# Patient Record
Sex: Female | Born: 1990 | Race: White | Marital: Single | Smoking: Never smoker
Health system: Southern US, Community
[De-identification: ages and names within clinical notes are randomized; demographics above are authoritative.]

## PROBLEM LIST (undated history)

## (undated) DIAGNOSIS — F32A Depression, unspecified: Secondary | ICD-10-CM

## (undated) DIAGNOSIS — J45909 Unspecified asthma, uncomplicated: Secondary | ICD-10-CM

## (undated) DIAGNOSIS — K589 Irritable bowel syndrome without diarrhea: Secondary | ICD-10-CM

## (undated) DIAGNOSIS — F419 Anxiety disorder, unspecified: Secondary | ICD-10-CM

## (undated) DIAGNOSIS — K219 Gastro-esophageal reflux disease without esophagitis: Secondary | ICD-10-CM

## (undated) DIAGNOSIS — G43909 Migraine, unspecified, not intractable, without status migrainosus: Secondary | ICD-10-CM

## (undated) DIAGNOSIS — J302 Other seasonal allergic rhinitis: Secondary | ICD-10-CM

## (undated) DIAGNOSIS — N83209 Unspecified ovarian cyst, unspecified side: Secondary | ICD-10-CM

## (undated) DIAGNOSIS — F329 Major depressive disorder, single episode, unspecified: Secondary | ICD-10-CM

## (undated) DIAGNOSIS — N946 Dysmenorrhea, unspecified: Secondary | ICD-10-CM

## (undated) DIAGNOSIS — J069 Acute upper respiratory infection, unspecified: Secondary | ICD-10-CM

## (undated) DIAGNOSIS — N809 Endometriosis, unspecified: Secondary | ICD-10-CM

## (undated) DIAGNOSIS — R51 Headache: Secondary | ICD-10-CM

## (undated) DIAGNOSIS — R42 Dizziness and giddiness: Secondary | ICD-10-CM

## (undated) DIAGNOSIS — H539 Unspecified visual disturbance: Secondary | ICD-10-CM

## (undated) DIAGNOSIS — R519 Headache, unspecified: Secondary | ICD-10-CM

## (undated) HISTORY — DX: Anxiety disorder, unspecified: F41.9

## (undated) HISTORY — DX: Migraine, unspecified, not intractable, without status migrainosus: G43.909

## (undated) HISTORY — DX: Depression, unspecified: F32.A

## (undated) HISTORY — DX: Unspecified asthma, uncomplicated: J45.909

## (undated) HISTORY — DX: Other seasonal allergic rhinitis: J30.2

## (undated) HISTORY — DX: Gastro-esophageal reflux disease without esophagitis: K21.9

## (undated) HISTORY — DX: Irritable bowel syndrome without diarrhea: K58.9

## (undated) HISTORY — PX: OTHER SURGICAL HISTORY: SHX169

## (undated) HISTORY — DX: Headache, unspecified: R51.9

## (undated) HISTORY — PX: LAPAROSCOPY ABDOMEN DIAGNOSTIC: PRO50

## (undated) HISTORY — DX: Dysmenorrhea, unspecified: N94.6

## (undated) HISTORY — DX: Acute upper respiratory infection, unspecified: J06.9

## (undated) HISTORY — PX: TONSILLECTOMY: SUR1361

## (undated) HISTORY — DX: Unspecified visual disturbance: H53.9

## (undated) HISTORY — DX: Headache: R51

## (undated) HISTORY — DX: Dizziness and giddiness: R42

---
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
## 2012-06-01 ENCOUNTER — Encounter (INDEPENDENT_AMBULATORY_CARE_PROVIDER_SITE_OTHER): Payer: Self-pay | Admitting: Family Medicine

## 2012-06-01 ENCOUNTER — Ambulatory Visit (INDEPENDENT_AMBULATORY_CARE_PROVIDER_SITE_OTHER): Payer: No Typology Code available for payment source | Admitting: Family Medicine

## 2012-06-01 VITALS — BP 123/85 | HR 65 | Temp 98.0°F | Resp 18 | Ht 62.0 in | Wt 125.0 lb

## 2012-06-01 MED ORDER — FAMOTIDINE 20 MG PO TABS
20.00 mg | ORAL_TABLET | Freq: Two times a day (BID) | ORAL | Status: AC
Start: 2012-06-01 — End: 2013-06-01

## 2012-06-01 MED ORDER — LANSOPRAZOLE 30 MG PO CPDR
30.00 mg | DELAYED_RELEASE_CAPSULE | Freq: Every day | ORAL | Status: AC
Start: 2012-06-01 — End: 2013-06-01

## 2012-06-01 MED ORDER — ONDANSETRON HCL 4 MG PO TABS
4.00 mg | ORAL_TABLET | Freq: Every day | ORAL | Status: AC | PRN
Start: 2012-06-01 — End: 2013-06-01

## 2012-06-01 MED ORDER — OMEPRAZOLE 40 MG PO CPDR
40.00 mg | DELAYED_RELEASE_CAPSULE | Freq: Every day | ORAL | Status: DC
Start: 2012-06-01 — End: 2012-06-01

## 2012-06-01 MED ORDER — ONDANSETRON 4 MG PO TBDP
4.00 mg | ORAL_TABLET | Freq: Once | ORAL | Status: AC
Start: 2012-06-01 — End: 2012-06-01
  Administered 2012-06-01: 4 mg via ORAL

## 2012-06-01 NOTE — Progress Notes (Signed)
Subjective:       Patient ID: Krystal Mills is a 21 y.o. female.    Chief Complaint   Patient presents with   . Heartburn     C/O extreme heart burn and breathing is uncomfortable           HPI Comments: Pt has history of acid reflux and also h/o irritable bowel with bowel spasms. Has severe allergies, not worse lately. Had been on prilosec, d/c'd due to causing headache and concerns over long term use. Has never seen GI or had upper GI or EGD. No h/o ulcer disease. Tested neg for h.pylori in past.  Has used bentyl in past for bowel spasms which helped, but not having those sx now.    Symptoms started this morning and accelerated while at work, epigastric discomfort to LUQ, chest discomfort and SOB, no cough. Mild nausea, no emesis.  Has not tried anything. Liquid antacids make her nauseated.         Heartburn  She complains of abdominal pain, heartburn and nausea. She reports no chest pain, no choking, no coughing or no wheezing.       The following portions of the patient's history were reviewed and updated as appropriate: allergies, current medications, past family history, past medical history, past social history, past surgical history and problem list.    Review of Systems   Respiratory: Positive for chest tightness and shortness of breath. Negative for cough, choking and wheezing.    Cardiovascular: Negative for chest pain.   Gastrointestinal: Positive for heartburn, nausea and abdominal pain. Negative for vomiting, diarrhea, constipation and blood in stool.           Objective:    Physical Exam   Constitutional: She appears well-developed and well-nourished.        Looks uncomfortable   HENT:   Head: Normocephalic and atraumatic.   Mouth/Throat: No oropharyngeal exudate.   Eyes: Conjunctivae normal are normal. No scleral icterus.   Neck: No JVD present. No thyromegaly present.   Cardiovascular: Normal rate, regular rhythm and normal heart sounds.    Pulmonary/Chest: Effort normal and breath sounds normal. No  stridor. She has no wheezes.   Abdominal: Soft. Normal appearance and bowel sounds are normal. There is no hepatosplenomegaly. There is tenderness in the epigastric area and left upper quadrant. There is no rigidity and no guarding.       Lymphadenopathy:     She has no cervical adenopathy.   Neurological: She is alert.             EKG: normal sinus rhythm, no ischemic changes    Assessment:       Severe reflux, probable esophageal spasm.  Pt given zofran with some relief of nausea. Was not given liquid antacid due to previous nausea with med.       Plan:       Restart PPI, try prevacid which hopefully will not cause headache as prilosec did. Start pepcid bid for a few days for faster relief. Follow up with GI.  Follow up if not improving or if any new symptoms appear.

## 2012-06-01 NOTE — Patient Instructions (Addendum)
Start on prilosec 40 mg once a day  -- best way to take is 30 minutes before a meal.  Stay on this until you are seen by a gastroenterologist.    Also start on pepcid 20 mg twice a day- take for 3-4 days or until reflux symptoms are completely gone.          GERD (Adult)    The esophagus is a tube that carries food from the mouth to the stomach. A valve at the lower end of the esophagus prevents stomach acid from flowing upward. If this valve does not work properly, acid from the stomach enters the esophagus. If this occurs over and over, the acid will injure the lining of the esophagus.  This condition is called GERD (gastroesophageal reflux disease) or acid reflux. When stomach acid flows upward into the esophagus, it causes burning, pressure or sharp pain in the upper abdomen or mid to lower chest. The pain can spread to the neck, back, or shoulder, similar to heart pain (angina). There may be belching, an acid taste in the back of the throat, chronic cough, or sore throat or hoarseness. GERD symptoms often occur during the day after a big meal, but it can also occur at night when lying down. Smoking,as well as drinking alcohol, increases the risk of GERD.  GERD is a chronic condition. Once it begins, it is often lifelong. Treatment includes changes in eating habits and the use of acid blocker medications to decrease the amount of acid in the stomach.  Symptoms often improve with treatment, but if treatment is stopped, the symptoms usually return after a few months. So most persons with GERD will need to continue treatment.  Home Care:   Take the prescribed acid blocker medication for the full course of treatment even if you begin to feel better sooner. This medication can take up to several days to fully control your symptoms. If you can't afford the prescribed medication, you can try over-the-counter acid blockers, such as Pepcid AC, Tagamet, Zantac, or Aciphex. If these do not relieve your symptoms, a  stronger acid-blocker can be tried, such as Prilosec OTC.   You can use antacids, such as Tums, Rolaids, Mylanta, or Maalox, for pain. This will be useful the first few days after starting acid blockers when the blockers haven't started working yet. Follow the directions on the label. Liquid antacids may work better than tablets. Note that antacids can interfere with absorption of certain medications. Specifically, do not take Tagamet (cimetidine), Zantac (ranitidine), or Carafate (sucralfate) within 1 hour of taking an antacid. Talk with your pharmacist if you have any questions.   Limit or avoid fatty, fried, and spicy foods, as well as coffee, chocolate, mint, and foods with high acid content such as tomatoes and citrus fruit and juices (orange, grapefruit, lemon).   Avoid alcohol and smoking.   Don't eat large meals, especially at night. Frequent, smaller meals are best. Do not lie down right after eating. And don't eat anything 3 hours before going to bed.   If you are overweight, losing weight will reduce symptoms. Women should not wear corsets or girdles because this increases pressure on the stomach and worsens reflux.   If your symptoms occur during sleep, use a foam wedge to elevate your upper body (not just your head.) Or, place 4" blocks under the head of your bed.  Follow Up  with your doctor or as advised by our staff. Further testing may be needed.  If you do not begin to improve over the next 4 days, contact your doctor. If you had an x-ray, CT scan, or ECG (electrocardiogram), it will be reviewed by a specialist. You'll be notified of any new findings that affect your care.  Get Prompt Medical Attention  if any of the following occur:   Stomach pain gets worse or moves to the lower right abdomen (appendix area)   Chest pain appears or gets worse, or spreads to the back, neck, shoulder, or arm   Frequent vomiting (can't keep down liquids)   Blood in the stool or vomit (red or black in  color)   Feeling weak or dizzy, fainting, or trouble breathing   Fever of 100.46F (38C) or higher, or as directed by your healthcare provider   [address], [place], [place] [address]. All rights reserved. This information is not intended as a substitute for professional medical care. Always follow your healthcare professional's instructions.

## 2012-10-26 ENCOUNTER — Encounter (INDEPENDENT_AMBULATORY_CARE_PROVIDER_SITE_OTHER): Payer: Self-pay

## 2012-10-26 ENCOUNTER — Ambulatory Visit (INDEPENDENT_AMBULATORY_CARE_PROVIDER_SITE_OTHER): Payer: No Typology Code available for payment source | Admitting: Family Medicine

## 2012-10-26 VITALS — BP 105/72 | HR 69 | Temp 99.3°F | Resp 18 | Ht 62.0 in | Wt 135.0 lb

## 2012-10-26 MED ORDER — AZITHROMYCIN 250 MG PO TABS
ORAL_TABLET | ORAL | Status: AC
Start: 2012-10-26 — End: 2012-10-31

## 2012-10-26 MED ORDER — BECLOMETHASONE DIPROPIONATE 40 MCG/ACT IN AERS
2.00 | INHALATION_SPRAY | Freq: Two times a day (BID) | RESPIRATORY_TRACT | Status: DC
Start: 2012-10-26 — End: 2013-09-28

## 2012-10-26 NOTE — Patient Instructions (Addendum)
REST,  FLUIDS,  DIET    STEAM /  VAPORIZOR    QVAR=  MAINTENANCE:   START WI TH  2  PUFFS TWICE DAILY AND TAPER DOWN / OFF

## 2012-10-26 NOTE — Progress Notes (Signed)
Subjective:       Patient ID: Krystal Mills is a 22 y.o. female.    HPI Comments:   Pt c/o uri x 2 days; c/o increased cough; using albuterol more since yesterday        URI   Associated symptoms include congestion, coughing, ear pain, headaches and a sore throat.         Review of Systems   HENT: Positive for ear pain, congestion, sore throat, voice change and sinus pressure.    Respiratory: Positive for cough.           Colorful productive cough; kept awake   Neurological: Positive for headaches.           Objective:     Physical Exam   Nursing note and vitals reviewed.  Constitutional: She is oriented to person, place, and time. She appears well-developed and well-nourished. No distress.   HENT:   Mouth/Throat: Oropharynx is clear and moist. No oropharyngeal exudate.        TM Bilat clear    Hoarse voice     Eyes: Conjunctivae normal and EOM are normal. Pupils are equal, round, and reactive to light.   Neck: Normal range of motion. Neck supple.   Cardiovascular: Regular rhythm and normal heart sounds.  Exam reveals no gallop.    No murmur heard.  Pulmonary/Chest: Effort normal and breath sounds normal. No respiratory distress. She has no wheezes. She has no rales. She exhibits no tenderness.          E : I Increased  No wheeze     Lymphadenopathy:     She has no cervical adenopathy.   Neurological: She is alert and oriented to person, place, and time. No cranial nerve deficit.   Skin: Skin is warm. She is not diaphoretic.           Assessment:       Acute bronchitis  Asthma related to GERD and allergic rhinitis      Plan:       zpack  qvar and taper  Rest , fluids, diet    F/u prn

## 2013-02-08 ENCOUNTER — Encounter (INDEPENDENT_AMBULATORY_CARE_PROVIDER_SITE_OTHER): Payer: Self-pay

## 2013-02-08 ENCOUNTER — Ambulatory Visit (INDEPENDENT_AMBULATORY_CARE_PROVIDER_SITE_OTHER): Payer: No Typology Code available for payment source | Admitting: Family Medicine

## 2013-02-08 VITALS — BP 124/81 | HR 67 | Temp 98.9°F | Resp 18 | Ht 62.0 in | Wt 135.0 lb

## 2013-02-08 MED ORDER — DOXYCYCLINE HYCLATE 100 MG PO TABS
100.0000 mg | ORAL_TABLET | Freq: Two times a day (BID) | ORAL | Status: DC
Start: 2013-02-08 — End: 2013-09-28

## 2013-02-08 NOTE — Patient Instructions (Signed)
Lyme Disease  Lyme disease is caused by bacteria passed to you during the bite of a deer tick. Because the tick is so small, most people with Lyme disease do not remember being bitten. Since tests for Lyme disease are not always accurate early in the disease, the diagnosis can be hard to make. If the disease is suspected and the tests are negative, repeat testing may be required.    If untreated, Lyme disease may affect many parts of the body over months to years. Not everyone will have all the symptoms.   The first symptoms may appear within a few days to a month after the tick bite. These symptoms include a round red rash that grows up to 12 inches across and looks like a bull's-eye target with darker outer ring and a darker center. There may fever, chills, fatigue, body aches and headache. This first stage may be skipped in 20-30% of infected persons. It goes away on its own, even without treatment.   If the first symptoms were not treated, new symptoms may appear weeks to months after the bite. These symptoms include episodes of joint pain and swelling (especially the knees). Chronic arthritis develops in about 10% of these people.   Later, there may be weakness in an arm, leg or one side of the face, meningitis (headache, fever and neck pain), numbness and tingling in the arms or legs, confusion, and memory loss.  When the symptoms of Lyme disease are treated early enough, they can be stopped. Sometimes a second or third course of antibiotics may be needed if symptoms persist.  Home Care:  1) If oral antibiotics have been prescribed, it is very important that you take them exactly as directed until they are completely gone.  2) You may use acetaminophen (Tylenol) or ibuprofen (Motrin, Advil) to control pain, unless another pain medicine was prescribed. [ NOTE : If you have chronic liver or kidney disease or ever had a stomach ulcer or GI bleeding, talk with your doctor before using these medicines.]  (Aspirin should never be used in anyone under 98 years of age who is ill with a fever. It may cause severe liver damage.)  Follow Up  with your doctor as directed.  Get Prompt Medical Attention  if any of the following occur:  -- Current symptoms get worse  -- Unexplained fever, neck pain or stiffness, or headache  -- Arm, leg or facial weakness  -- Irregular or rapid heart beat  -- Joint pain or swelling  -- Numbness and tingling in the arms or legs, confusion or memory loss   [address] [place], [place] [address]. All rights reserved. This information is not intended as a substitute for professional medical care. Always follow your healthcare professional's instructions.        FOLLOW UP  WITH PCP       FOLLOW UP LYME TITER IN  3 -6  WEEKS    PROBIOTICS    [employer] ,  PRODUCE

## 2013-02-08 NOTE — Progress Notes (Signed)
Subjective:       Patient ID: Krystal Mills is a 22 y.o. female.    HPI Comments:   Pt c/o rash / bug bite this morning.    Denies pain admits pain.          Review of Systems   Constitutional: Positive for fatigue.   HENT: Positive for neck pain.    Musculoskeletal: Positive for back pain.          Jaw pain    LBP and shoulder pain     Skin: Positive for rash.   Neurological: Positive for headaches.           Objective:     Physical Exam   Nursing note and vitals reviewed.  Constitutional: She is oriented to person, place, and time. She appears well-developed and well-nourished. No distress.   HENT:   Head: Normocephalic and atraumatic.   Eyes: EOM are normal. Pupils are equal, round, and reactive to light.   Neck: Normal range of motion. Neck supple.   Cardiovascular: Normal rate, regular rhythm and normal heart sounds.  Exam reveals no gallop.    No murmur heard.  Pulmonary/Chest: Effort normal and breath sounds normal. No respiratory distress. She has no wheezes. She has no rales.   Musculoskeletal: Normal range of motion. She exhibits no tenderness.   Lymphadenopathy:     She has no cervical adenopathy.   Neurological: She is alert and oriented to person, place, and time. She displays normal reflexes. No cranial nerve deficit. She exhibits normal muscle tone.   Skin: Skin is warm and dry. Rash noted. She is not diaphoretic. No erythema. No pallor.          R. Anterior shoulder area:  Macular Erythema Migrans approx 6 cm. Warm and mildly tender.  No D/C  No LN     Psychiatric: She has a normal mood and affect. Her behavior is normal. Judgment and thought content normal.           Assessment:       Erythema migrans  Insect bite, suspect tick bite      Plan:       Doxycycline x 3 weeks    F/u Lyme titer in 3 -6 weeks    F/u with pcp

## 2013-06-27 DIAGNOSIS — S62619A Displaced fracture of proximal phalanx of unspecified finger, initial encounter for closed fracture: Secondary | ICD-10-CM

## 2013-06-27 HISTORY — DX: Displaced fracture of proximal phalanx of unspecified finger, initial encounter for closed fracture: S62.619A

## 2013-09-25 ENCOUNTER — Ambulatory Visit (INDEPENDENT_AMBULATORY_CARE_PROVIDER_SITE_OTHER): Payer: No Typology Code available for payment source | Admitting: Family Medicine

## 2013-09-28 ENCOUNTER — Ambulatory Visit (INDEPENDENT_AMBULATORY_CARE_PROVIDER_SITE_OTHER): Payer: Commercial Managed Care - POS | Admitting: Family Medicine

## 2013-09-28 ENCOUNTER — Encounter (INDEPENDENT_AMBULATORY_CARE_PROVIDER_SITE_OTHER): Payer: Self-pay | Admitting: Family Medicine

## 2013-09-28 VITALS — BP 115/75 | HR 69 | Temp 97.5°F | Ht 62.0 in | Wt 145.0 lb

## 2013-09-28 DIAGNOSIS — K219 Gastro-esophageal reflux disease without esophagitis: Secondary | ICD-10-CM | POA: Insufficient documentation

## 2013-09-28 DIAGNOSIS — Z113 Encounter for screening for infections with a predominantly sexual mode of transmission: Secondary | ICD-10-CM

## 2013-09-28 DIAGNOSIS — K589 Irritable bowel syndrome without diarrhea: Secondary | ICD-10-CM

## 2013-09-28 DIAGNOSIS — Z9109 Other allergy status, other than to drugs and biological substances: Secondary | ICD-10-CM | POA: Insufficient documentation

## 2013-09-28 DIAGNOSIS — J4520 Mild intermittent asthma, uncomplicated: Secondary | ICD-10-CM | POA: Insufficient documentation

## 2013-09-28 DIAGNOSIS — R102 Pelvic and perineal pain: Secondary | ICD-10-CM

## 2013-09-28 DIAGNOSIS — Z0000 Encounter for general adult medical examination without abnormal findings: Secondary | ICD-10-CM

## 2013-09-28 DIAGNOSIS — R8271 Bacteriuria: Secondary | ICD-10-CM

## 2013-09-28 DIAGNOSIS — N760 Acute vaginitis: Secondary | ICD-10-CM

## 2013-09-28 DIAGNOSIS — Z13228 Encounter for screening for other metabolic disorders: Secondary | ICD-10-CM

## 2013-09-28 DIAGNOSIS — G8929 Other chronic pain: Secondary | ICD-10-CM

## 2013-09-28 DIAGNOSIS — F419 Anxiety disorder, unspecified: Secondary | ICD-10-CM | POA: Insufficient documentation

## 2013-09-28 DIAGNOSIS — N949 Unspecified condition associated with female genital organs and menstrual cycle: Secondary | ICD-10-CM

## 2013-09-28 DIAGNOSIS — Z1329 Encounter for screening for other suspected endocrine disorder: Secondary | ICD-10-CM

## 2013-09-28 DIAGNOSIS — Z8349 Family history of other endocrine, nutritional and metabolic diseases: Secondary | ICD-10-CM

## 2013-09-28 DIAGNOSIS — G43909 Migraine, unspecified, not intractable, without status migrainosus: Secondary | ICD-10-CM

## 2013-09-28 LAB — POCT URINALYSIS DIPSTIX (10)(MULTI-TEST)
Bilirubin, UA POCT: NEGATIVE
Blood, UA POCT: NEGATIVE
Glucose, UA POCT: NEGATIVE mg/dL
Ketones, UA POCT: NEGATIVE mg/dL
Nitrite, UA POCT: NEGATIVE
POCT Spec Gravity, UA: 1.03 (ref 1.001–1.035)
POCT pH, UA: 6 (ref 5–8)
Protein, UA POCT: NEGATIVE mg/dL
Urobilinogen, UA: 0.2 mg/dL

## 2013-09-28 MED ORDER — DICYCLOMINE HCL 10 MG PO CAPS
10.0000 mg | ORAL_CAPSULE | Freq: Four times a day (QID) | ORAL | Status: AC
Start: 2013-09-28 — End: ?

## 2013-09-28 NOTE — Patient Instructions (Signed)
Preventing Vaginitis  Vaginitis is irritation or infection of the vagina or vulva. It can be caused by bacteria, viruses, or yeast. Chemicals (such as in perfumes or soaps) can sometimes be a cause. You can help prevent vaginitis. Follow the tips below. And see your healthcare provider if you have any symptoms.  Hygiene     Use mild, unscented soap when you bathe or shower to avoid irritating your vagina.     Avoid chemicals. Do not use vaginal sprays. Do not use scented toilet paper or tampons that are scented. Sprays and scents have chemicals that can irritate your vagina.   Do not douche unless you are told to by your healthcare provider. Douching is rarely needed. And it upsets the normal balance in the vagina.   Wash yourself well. Wash the outer vaginal area (vulva) every day with mild, unscented soap. Keep it as dry as possible.   Wipe correctly. Make sure to wipe from front to back after a bowel movement. This helps keep from spreading bacteria from your anus to your vagina.   Change your tampon often. During your period, make sure to change your tampon as often as directed on the package. This allows the normal flow of vaginal discharge.  Lifestyle   Limit your number of sexual partners. The more partners you have, the greater your risk of infection. Using condoms helps reduce your risk.   Get enough sleep. Sleep helps keep your body's immune system healthy. This helps you fight infection.   Lose weight, if needed. Excess weight can reduce air circulation around your vagina. This can increase your risk of infection.   Exercise regularly. Regular activity helps keep your body healthy.  Clothing   Don't sit in wet clothes. Yeast thrive when it's warm and damp.   Don't wear tight pants. And don't wear tights, leggings, or hose without a cotton crotch. These types of clothing trap warmth and moisture.   Wear cotton underwear. Cotton lets air circulate around the vagina.  Symptoms of Vaginitis    Irritation, swelling, or itching of the genital area   Vaginal discharge   Bad vaginal odor   Pain or burning during urination    2000-2014 [employer], [address], [place] [address]. All rights reserved. This information is not intended as a substitute for professional medical care. Always follow your healthcare professional's instructions.

## 2013-09-28 NOTE — Progress Notes (Signed)
Subjective:       Patient ID: Krystal Mills is a 23 y.o. female.    HPI    Krystal Mills is a 23 y.o. female who comes in today for check up. Has a PCP at home in [place], This is new insurance.  Chief Complaint   Patient presents with   . Annual Exam     patient is fasting   . Back Pain     left side- has been going on about 6 weeks. Feels like a knife going through left flank area. Keeps getting worse. Goes from dull constant to sharp several times a day for a few seconds at a time. Stops her in her tracks.   . Night Sweats- sometimes drenching night sweats that are very troubling since two rounds of prednisone recently   . Vaginal Bleeding     when having sex while not on her menstrual cycle and becoming painful. When she has sex, she goes to the bathroom and there is blood when she wipes. No further bleeding after the single episode. Feels local pain to the perineum at that time.   Needs refill dicyclomine for IBS    HPI        Active Problems  Patient Active Problem List    Diagnosis Date Noted   . Mild intermittent asthma without complication 09/28/2013   . GERD (gastroesophageal reflux disease) 09/28/2013   . Environmental allergies 09/28/2013   . Anxiety and depression 09/28/2013   . IBS (irritable bowel syndrome) 09/28/2013   . Migraines        Past Surgical History  Past Surgical History   Procedure Date   . Tonsil remove        Medications  Current Outpatient Prescriptions   Medication Sig Dispense Refill   . ALBUTEROL SULFATE HFA IN Inhale 1 puff into the lungs 4 (four) times daily as needed.       . clonazePAM (KLONOPIN) 0.5 MG tablet Take 0.5 mg by mouth 2 (two) times daily as needed.       . montelukast (SINGULAIR) 10 MG tablet Take 10 mg by mouth nightly.       . norethindrone-ethinyl estradiol (JUNEL FE 1/20) 1-20 MG-MCG per tablet Take 1 tablet by mouth daily.       Marland Kitchen venlafaxine (EFFEXOR-XR) 37.5 MG 24 hr capsule Take 37.5 mg by mouth daily.       . cetirizine (ZYRTEC) 10 MG tablet Take 10 mg by  mouth daily.       . [DISCONTINUED] albuterol (PROVENTIL) (2.5 MG/3ML) 0.083% nebulizer solution Take 2.5 mg by nebulization as needed.       . [DISCONTINUED] beclomethasone (QVAR) 40 MCG/ACT inhaler Inhale 2 puffs into the lungs 2 (two) times daily.  1 Inhaler  0   . [DISCONTINUED] busPIRone (BUSPAR) 10 MG tablet Take 10 mg by mouth 3 (three) times daily.       . [DISCONTINUED] doxycycline (VIBRA-TABS) 100 MG tablet Take 1 tablet (100 mg total) by mouth 2 (two) times daily.  42 tablet  0   . [DISCONTINUED] escitalopram (LEXAPRO) 10 MG tablet Take 10 mg by mouth daily.       . [DISCONTINUED] pantoprazole (PROTONIX) 20 MG tablet Take 20 mg by mouth daily.           Allergies  Allergies   Allergen Reactions   . Codeine Nausea And Vomiting   . Sulfa Antibiotics Hives   . Augmentin (Amoxicillin-Pot Clavulanate) Nausea And Vomiting   .  Neosporin (Neomycin-Bacitracin Zn-Polymyx) Rash       Gyn History    Obstetric History     No data available     Patient's last menstrual period was 09/12/2013.    Social History  History     Social History   . Marital Status: Single     Spouse Name: N/A     Number of Children: N/A   . Years of Education: 16     Occupational History   . Sales, Social research officer, government     Social History Main Topics   . Smoking status: Never Smoker    . Smokeless tobacco: Not on file   . Alcohol Use: Yes      Comment: occasional   . Drug Use: No      Comment: birth control pills   . Sexually Active: Yes -- Female partner(s)     Birth Control/ Protection: OCP, Condom     Other Topics Concern   . Not on file     Social History Narrative   . No narrative on file       Family History  Family History   Problem Relation Age of Onset   . Hypertension Father    . Heart disease Father      pericarditis   . Stroke Paternal Grandmother    . Thyroid disease Mother    . Depression Brother        Review of Systems  CONST: No wt change, no f/c/s  EYES:  No blurry vision, double vision, loss of peripheral vision  HENT:  No hearing  loss, tinnitus, allergies, teeth problems  CV:   No CP, palpitations, leg swelling  RESP:  No SOB, cough, wheeze  GI:   No n/v, diarrhea, constipation, abd pain, heartburn, blood in stool  GU:   No dysuria, frequency, incontinence, breast changes has abnormal vaginal bleeding or discharge. Has left flank pain  MS:  No joint or muscle pain  SKIN:   No rashes or concerning moles  ENDO:  No heat/cold intolerance, menstrual changes, polyuria, polydipsia  HEME:  No bruising/bleeding, swollen glands. She is having night sweats  NEURO:  No HA, LH, dizziness, numbness/tingling, weakness  PSYCH:  No depressed mood, anhedonia, anxiety                Review of Systems        Objective:    Physical Exam      Physical Exam:  BP 115/75  Pulse 69  Temp 97.5 F (36.4 C) (Oral)  Ht 1.575 m (5\' 2" )  Wt 65.772 kg (145 lb)  BMI 26.51 kg/m2  SpO2 98%  LMP 09/12/2013  Wt Readings from Last 3 Encounters:   09/28/13 65.772 kg (145 lb)   02/08/13 61.236 kg (135 lb)   10/26/12 61.236 kg (135 lb)     CONST: NAD, normal weight  HENT: B TMs normal, OP clear, normal dentition  EYES: PERRL, no pallor or scleral icterus, conjunctiva not injected  NECK: supple, no TM or nodules  LYMPH: no cervical or supraclavicular LAD  CV: RRR, no m/r/g.  No LE edema.  Pedal pulses present and equal.  RESP: CTAB, normal effort  GI: soft, NT/ND, NABS, no HSM  AO:ZHYQMV female external genitalia without lesions. The vaginal mucosa is pink, moist and intact. There is white discharge with faint odor odor. Perineum is intact.   SKIN: warm, dry.  no visible rashes or concerning moles  MSK: Grossly normal ROM and strength  in all 4 extremities  NEURO: Cranial nerves 2-12 intact, normal gait  PSYCH: A&O x3, normal mood and affect  Office Visit on 09/28/2013   Component Date Value Range Status   . POCT Spec Gravity, UA 09/28/2013 1.030  1.001 - 1.035 Final   . POCT pH, UA 09/28/2013 6.0  5 - 8 Final   . Glucose, UA POCT 09/28/2013 Negative  Negative mg/dL Final   .  Protein, UA POCT 09/28/2013 Negative  Negative mg/dL Final   . Ketones, UA POCT 09/28/2013 Negative  Negative mg/dL Final   . Blood, UA POCT 09/28/2013 Negative  Negative, Trace Final   . POCT Leukocytes, UA 09/28/2013 1+* Negative Final   . Nitrite, UA POCT 09/28/2013 Negative  Negative Final   . Bilirubin, UA POCT 09/28/2013 Negative  Negative Final   . Urobilinogen, UA 09/28/2013 0.2  0.2, 1.0, 2.0 mg/dL Final   ]  Assessment:       1. Routine general medical examination at a health care facility  CBC and differential    Comprehensive metabolic panel    Lipid panel   2. Mild intermittent asthma without complication     3. GERD (gastroesophageal reflux disease)     4. Environmental allergies     5. Anxiety and depression     6. IBS (irritable bowel syndrome)     7. Perineal pain in female  POCT urinalysis dipstick    UA/Micro reflex Culture Routine   8. Family history of thyroid disease  TSH    T4, free    T3, free   9. Screen for STD (sexually transmitted disease)  Chlamydia - Gonococcus DNA,SDA    CP:RPR Screen Only & HIV Antibody w/Rfx   10. Screening for other and unspecified endocrine, nutritional, metabolic, and immunity disorders  Vitamin D 25 hydroxy   11. Left flank pain, chronic  UA/Micro reflex Culture Routine    Ultrasound renal kidney   12. Bacteria in urine  UA/Micro reflex Culture Routine   13. Vaginitis  SureSwab(TM) Plus   14. Migraines             Plan:       Counseling/Anticipatory Guidance:  nutrition, family planning/contraception, physical activity, healthy weight, injury prevention, misuse of tobacco, alcohol and drugs, sexual behavior and STDs, dental health, mental health, immunizations, screenings, breast cancer and self breast exams.   Discussed multiple concerns. She does have a positive Murphy punch on the right flank and I am concerned that some of her symptoms may be kidney related. She has a little bit of bacteria in her urine which I am sending out for a culture. I am not certain  what may be causing the night sweats. Will await the initial labs. She does have a vaginal discharge which is also being sent for culture.

## 2013-09-29 ENCOUNTER — Ambulatory Visit
Admission: RE | Admit: 2013-09-29 | Discharge: 2013-09-29 | Disposition: A | Discharge status: Home / Self Care | Payer: Commercial Managed Care - POS | Source: Ambulatory Visit | Attending: Family Medicine | Admitting: Family Medicine

## 2013-09-29 DIAGNOSIS — R109 Unspecified abdominal pain: Secondary | ICD-10-CM | POA: Insufficient documentation

## 2013-09-29 DIAGNOSIS — G8929 Other chronic pain: Secondary | ICD-10-CM | POA: Insufficient documentation

## 2013-09-29 LAB — CHLAMYDIA TRACHOMATIS AND NEISSERIA GONORRHOEAE, RNA, TMA
C.trachomatis RNA,TMA: NOT DETECTED
N. gonorrhoeae RNA, TMA: NOT DETECTED

## 2013-10-01 LAB — CBC AND DIFFERENTIAL
Atypical Lymphocytes %: 0 %
Baso(Absolute): 12 cells/uL (ref 0–200)
Basophils: 0.2 %
Eosinophils Absolute: 52 cells/uL (ref 15–500)
Eosinophils: 0.9 %
Hematocrit: 39.7 % (ref 35.0–45.0)
Hemoglobin: 13 g/dL (ref 11.7–15.5)
Lymphocytes Absolute: 2366 cells/uL (ref 850–3900)
Lymphocytes: 40.8 %
MCH: 30.9 pg (ref 27–33)
MCHC: 32.8 g/dL (ref 32–36)
MCV: 94 fL (ref 80–100)
MPV: 9.7 fL (ref 7.5–11.5)
Monocytes Absolute: 238 cells/uL (ref 200–950)
Monocytes: 4.1 %
Neutrophils Absolute: 3132 cells/uL (ref 1500–7800)
Neutrophils: 54 %
Platelets: 218 10*3/uL (ref 140–400)
RBC: 4.22 10*6/uL (ref 3.80–5.10)
RDW: 12.8 % (ref 11.0–15.0)
WBC: 5.8 10*3/uL (ref 3.8–10.8)

## 2013-10-01 LAB — COMPREHENSIVE METABOLIC PANEL
ALT: 8 U/L (ref 6–29)
AST (SGOT): 12 U/L (ref 10–30)
Albumin/Globulin Ratio: 1.7 (ref 1.0–2.5)
Albumin: 4.5 G/DL (ref 3.6–5.1)
Alkaline Phosphatase: 44 U/L (ref 33–115)
BUN: 11 MG/DL (ref 7–25)
Bilirubin, Total: 0.6 MG/DL (ref 0.2–1.2)
CO2: 22 mmol/L (ref 19–30)
Calcium: 9.1 MG/DL (ref 8.6–10.2)
Chloride: 101 mmol/L (ref 98–110)
Creatinine: 0.67 mg/dL (ref 0.50–1.10)
EGFR African American: 143 mL/min/{1.73_m2} (ref >=60)
EGFR: 123 mL/min/{1.73_m2} (ref >=60)
Globulin: 2.6 G/DL (ref 1.9–3.7)
Glucose: 80 MG/DL (ref 65–99)
Potassium: 4.2 mmol/L (ref 3.5–5.3)
Protein, Total: 7.1 G/DL (ref 6.1–8.1)
Sodium: 135 mmol/L (ref 135–146)

## 2013-10-01 LAB — LIPID PANEL
Cholesterol / HDL Ratio: 3.4 (ref 0.0–5.0)
Cholesterol: 162 MG/DL (ref 125–200)
HDL: 48 mg/dL (ref >=46)
LDL Calculated: 77 mg/dL (ref <130)
Non HDL Cholesterol (LDL and VLDL): 114 mg/dL
Triglycerides: 183 MG/DL — ABNORMAL HIGH (ref <150)

## 2013-10-01 LAB — T4, FREE: T4 Free: 1.2 ng/dL (ref 0.8–1.8)

## 2013-10-01 LAB — VITAMIN D-25 HYDROXY (D2/D3/TOTAL)
25-Hydroxy D2: <4 ng/mL
Vitamin D 25-OH D3: 36 ng/mL
Vitamin D 25-OH Total: 36 ng/mL (ref 30–100)

## 2013-10-01 LAB — T3, FREE: T3, Free: 3.1 pg/mL (ref 2.3–4.2)

## 2013-10-01 LAB — CUSTOM PROFILE: RPR SCREEN ONLY AND HIV ANTIBODY WITH REFLEX
HIV 1/2 Antibody: NONREACTIVE
RPR: NONREACTIVE

## 2013-10-01 LAB — TSH: TSH: 1.41 mIU/L (ref 0.40–4.50)

## 2013-10-02 ENCOUNTER — Encounter (INDEPENDENT_AMBULATORY_CARE_PROVIDER_SITE_OTHER): Payer: Self-pay | Admitting: Family Medicine

## 2013-10-02 LAB — URINALYSIS REFLEX TO MICROSCOPIC EXAM - REFLEX TO CULTURE
Bilirubin, UA: NEGATIVE
Blood, UA: NEGATIVE
Glucose Qualitative: NEGATIVE
Ketones UA: NEGATIVE
NITRITE: NEGATIVE
Protein, UA: NEGATIVE
RBC UA: NONE SEEN (ref 0–3)
Specific Gravity, UA: 1.022 (ref 1.001–1.035)
pH: 5.5 (ref 5.0–8.0)

## 2013-10-02 LAB — SURESWAB(TM) PLUS
Atopobium vaginae: NOT DETECTED
C.trachomatis RNA,TMA: NOT DETECTED
Candida Glabrata,DNA: NOT DETECTED
Candida Parapsilosis,DNA: NOT DETECTED
Candida Tropicalis,DNA: NOT DETECTED
Candida albicans DNA: DETECTED — AB
Gardnerella vaginalis: <4.7 Log (cells/mL)
Lactobacillus species: 6.6 Log (cells/mL)
Megasphaera species: NOT DETECTED
Neisseria gonorrhoeae by PCR: NOT DETECTED
Trichomonas Vaginalis RNA, QL TMA: NOT DETECTED

## 2013-10-02 LAB — REFLEX - URINE CULTURE, ROUTINE

## 2013-11-14 ENCOUNTER — Ambulatory Visit (INDEPENDENT_AMBULATORY_CARE_PROVIDER_SITE_OTHER): Payer: Commercial Managed Care - POS | Admitting: Internal Medicine

## 2013-11-14 ENCOUNTER — Encounter (INDEPENDENT_AMBULATORY_CARE_PROVIDER_SITE_OTHER): Payer: Self-pay | Admitting: Internal Medicine

## 2013-11-14 VITALS — BP 120/80 | HR 79 | Temp 97.5°F | Resp 12 | Ht 62.0 in | Wt 151.0 lb

## 2013-11-14 DIAGNOSIS — R350 Frequency of micturition: Secondary | ICD-10-CM

## 2013-11-14 DIAGNOSIS — R300 Dysuria: Secondary | ICD-10-CM

## 2013-11-14 DIAGNOSIS — IMO0001 Reserved for inherently not codable concepts without codable children: Secondary | ICD-10-CM

## 2013-11-14 LAB — POCT URINALYSIS DIPSTIX (10)(MULTI-TEST)
Bilirubin, UA POCT: NEGATIVE
Blood, UA POCT: NEGATIVE
Glucose, UA POCT: NEGATIVE mg/dL
Ketones, UA POCT: NEGATIVE mg/dL
Nitrite, UA POCT: NEGATIVE
POCT Leukocytes, UA: NEGATIVE
POCT Spec Gravity, UA: 1.03 (ref 1.001–1.035)
POCT pH, UA: 6 (ref 5–8)
Protein, UA POCT: NEGATIVE mg/dL
Urobilinogen, UA: 0.2 mg/dL

## 2013-11-14 MED ORDER — CIPROFLOXACIN HCL 250 MG PO TABS
250.0000 mg | ORAL_TABLET | Freq: Two times a day (BID) | ORAL | Status: AC
Start: 2013-11-14 — End: 2013-11-19

## 2013-11-14 NOTE — Progress Notes (Signed)
1. Have you self referred yourself since we last saw you? no  Refer to care team   Or   Add specialists:

## 2013-11-14 NOTE — Addendum Note (Signed)
Addended by: Josha Weekley G on: 11/14/2013 12:12 PM     Modules accepted: Orders

## 2013-11-14 NOTE — Progress Notes (Signed)
PROGRESS NOTE    Date Time: 11/14/2013 11:51 AM  Patient Name: Krystal Mills    Chief Complaint   Patient presents with   . Urinary Tract Infection Symptoms       Subjective:   Patient is a 23 y.o. female with PMH as below is here complaining of dysuria and urgency for the past 7 days.  Patient took over-the-counter medication ? phenzopyridine, however, symptoms persisted.  Denies any fever, chills, nausea, vomiting, abdominal pain, vaginal discharge.  Of note, patient was tested negative for sexually transmitted disease checkup about a month ago.  Patient does report muscle skeletal pain at left flank, patient had workup about 2 months ago with normal kidney function and renal ultrasound.  Denies any other symptoms today.     PMH, Allergy, Current Med, Family hx, Surgical Hx, Social Hx, Problem list reviewed with the patient.    HISTORY:  Past Medical History   Diagnosis Date   . IBS (irritable bowel syndrome)    . Asthma without status asthmaticus      related to GERD; TX'd with albuterol   . Gastroesophageal reflux disease    . 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 Depression    . Anxiety    . Seasonal allergic rhinitis    . Migraines      Past Surgical History   Procedure Date   . Tonsil remove      Family History   Problem Relation Age of Onset   . Hypertension Father    . Heart disease Father      pericarditis   . Stroke Paternal Grandmother    . Thyroid disease Mother    . Depression Brother      History     Social History   . Marital Status: Single     Spouse Name: N/A     Number of Children: N/A   . Years of Education: N/A     Social History Main Topics   . Smoking status: Never Smoker    . Smokeless tobacco: None   . Alcohol Use: Yes      Comment: occasional   . Drug Use: No      Comment: birth control pills   . Sexually Active: Yes -- Female partner(s)     Birth Control/ Protection: OCP, Condom     Other Topics Concern   . None     Social History  Narrative   . None       There is no immunization history on file for this patient.    Review of Systems:     Pertinent review of systems as in HPI.        Physical Exam:   BP 120/80  Pulse 79  Temp 97.5 F (36.4 C) (Oral)  Resp 12  Ht 1.575 m (5\' 2" )  Wt 68.493 kg (151 lb)  BMI 27.61 kg/m2  SpO2 98%  LMP 11/05/2013 Body mass index is 27.61 kg/(m^2).    General appearance - sitting up in chair, comfortable   Eyes - pupils equal and reactive, normal conjunctiva  HEENT - supple, thyroid not enlarged   CV - normal S1, S2, no murmurs, positive pedal pulse, no edema  Resp - normal respiratory effort, clear breath sound bilaterally   Abdomen - soft, non tender palpation, no splenomegaly   Musculoskeletal: normal gait, extremities with no clubbing     Assessment/Plan:  1. Dysuria  Urine Culture    Chlamydia - Gonococcus DNA,SDA   2. Frequency  Urine Culture    Chlamydia - Gonococcus DNA,SDA  -- Symptom consistent urinary tract infection.  However, POCT urinalysis did not show any evidence of leukocytes.  We will check microscopic urinalysis, urine culture and chlamydia and gonorrhea.  We will treat empirically with a course of ciprofloxacin, potential SEs discussed with the patient, pt verbalized understanding.  Patient was instructed to follow-up if no improvement.       Requested Prescriptions     Signed Prescriptions Disp Refills   . ciprofloxacin (CIPRO) 250 MG tablet 10 tablet 0     Sig: Take 1 tablet (250 mg total) by mouth 2 (two) times daily.        Signed by: Lynelle Doctor, MD  Internal Medicine

## 2013-11-16 LAB — CHLAMYDIA TRACHOMATIS AND NEISSERIA GONORRHOEAE, RNA, TMA
C.trachomatis RNA,TMA: NOT DETECTED
N. gonorrhoeae RNA, TMA: NOT DETECTED

## 2013-11-17 LAB — URINE CULTURE

## 2013-11-21 ENCOUNTER — Ambulatory Visit (INDEPENDENT_AMBULATORY_CARE_PROVIDER_SITE_OTHER): Payer: Self-pay | Admitting: Family Medicine

## 2013-11-22 ENCOUNTER — Encounter (INDEPENDENT_AMBULATORY_CARE_PROVIDER_SITE_OTHER): Payer: Self-pay | Admitting: Internal Medicine

## 2014-01-09 ENCOUNTER — Encounter (INDEPENDENT_AMBULATORY_CARE_PROVIDER_SITE_OTHER): Payer: Self-pay | Admitting: Family Medicine

## 2014-01-09 ENCOUNTER — Ambulatory Visit (INDEPENDENT_AMBULATORY_CARE_PROVIDER_SITE_OTHER): Payer: Commercial Managed Care - POS | Admitting: Family Medicine

## 2014-01-09 VITALS — BP 116/72 | HR 73 | Temp 97.8°F | Resp 16 | Ht 62.0 in | Wt 157.4 lb

## 2014-01-09 DIAGNOSIS — L259 Unspecified contact dermatitis, unspecified cause: Secondary | ICD-10-CM

## 2014-01-09 MED ORDER — DOXEPIN HCL 25 MG PO CAPS
25.0000 mg | ORAL_CAPSULE | Freq: Every evening | ORAL | Status: AC
Start: 2014-01-09 — End: ?

## 2014-01-09 MED ORDER — PREDNISONE 20 MG PO TABS
ORAL_TABLET | ORAL | Status: AC
Start: 2014-01-09 — End: ?

## 2014-01-09 NOTE — Progress Notes (Signed)
1. Have you self referred yourself since we last saw you? "No"

## 2014-01-09 NOTE — Patient Instructions (Signed)
Contact Dermatitis [General]  The rash that you have is a reaction to an irritant that you have come in contact with. This may be due to any of the following: plants (such as poison oak or ivy); chemicals (such as hair dyes and rinses, soaps, solvents, waxes, fingernail polish, deodorants). Also, jewelry or watchbands made of nickel can cause a reaction. The rash may itch or feel irritated. This rash is not contagious. Treatment for this problem requires avoiding further exposure to whatever caused this reaction. It also includes treating the skin with medicine to reduce irritation.  Home Care:   Avoid anything that heats up your skin (hot showers/baths, direct sunlight) since this will tend to make itching worse.   For weeping, blistered areas apply cold compresses. (Dip a face cloth into a mixture of one pint of cold water with one packet of Domeboro Powder or Aveeno Oatmeal powder (available at drug stores). This should be done for 30 minutes three to four times a day. Keep the solution refrigerated for future use. If large areas of skin are involved, you may take a lukewarm bath with one cup of cornstarch or Aveeno Oatmeal powder added to the water.   For localized rash, use hydrocortisone cream (available over-the-counter) for redness and irritation, unless another medicine was prescribed. For severe itching, apply an ice compress locally. You can also use benzocaine anesthetic cream or spray (available at drug and grocery stores as Lanacaine or Solarcaine).   Oral Benadryl (diphenhydramine) is an antihistamine available at drug and grocery stores. Unless a prescription antihistamine was given, Benadryl may be used to reduce itching if large areas of the skin are involved. Use lower doses during the daytime and higher doses at bedtime since the drug may make you sleepy. [NOTE: Do not use Benadryl if you have glaucoma or if you are a man with trouble urinating due to an enlarged prostate.] Claritin  (loratadine) is an antihistamine that causes less drowsiness and is a good alternative for daytime use.   If your reaction is due to a plant exposure , it is important to wash all of the plant oils off of your skin and the clothes you were wearing when you contacted the plant. Use ordinary soap and water to wash from top of your head to your toes. Launder all clothes that you were wearing in hot water with ordinary laundry detergent.  Follow Up  with your doctor or this facility as directed.  Get Prompt Medical Attention  if any of the following occur:   Spreading of the rash to other parts of the body   Severe swelling of the face, eyelids, mouth, throat or tongue   Difficulty urinating due to swelling in the genital area   Signs of infection in the areas of broken blisters:   Spreading redness   Pus or fluid draining from the blisters   Yellow-brown crusts form over the open blisters   Fever of 100.4F(38C) or higher, or as directed by your healthcare provider   2000-2014 [employer], [address], [place] [address]. All rights reserved. This information is not intended as a substitute for professional medical care. Always follow your healthcare professional's instructions.

## 2014-01-09 NOTE — Progress Notes (Signed)
Subjective:       Patient ID: Krystal Mills is a 23 y.o. female.    HPI    Krystal Mills is a 23 y.o. female who comes in today for a rash that started yesterday morning. Dove back from [place] yesterday morning. When she got home, she took and nap and noticed that her ears were red. Has progress over the upper half of her body. Did use sun block and washed it off after being at the beach. Also used a hot tub with "good chemicals" and took a shower after that. Boyfriend has nothing.    HPI        Active Problems  Patient Active Problem List    Diagnosis Date Noted   . Mild intermittent asthma without complication 09/28/2013   . GERD (gastroesophageal reflux disease) 09/28/2013   . Environmental allergies 09/28/2013   . Anxiety and depression 09/28/2013   . IBS (irritable bowel syndrome) 09/28/2013   . Migraines        Past Surgical History  Past Surgical History   Procedure Laterality Date   . Tonsil remove         Medications  Current Outpatient Prescriptions   Medication Sig Dispense Refill   . ALBUTEROL SULFATE HFA IN Inhale 1 puff into the lungs 4 (four) times daily as needed.       . clonazePAM (KLONOPIN) 0.5 MG tablet Take 0.5 mg by mouth 2 (two) times daily as needed.       . dicyclomine (BENTYL) 10 MG capsule Take 1 capsule (10 mg total) by mouth 4 times daily - with meals and at bedtime.  60 capsule  3   . norethindrone-ethinyl estradiol (JUNEL FE 1/20) 1-20 MG-MCG per tablet Take 1 tablet by mouth daily.       . cetirizine (ZYRTEC) 10 MG tablet Take 10 mg by mouth daily.       . montelukast (SINGULAIR) 10 MG tablet Take 10 mg by mouth nightly.       . venlafaxine (EFFEXOR-XR) 37.5 MG 24 hr capsule Take 37.5 mg by mouth daily.         No current facility-administered medications for this visit.       Allergies  Allergies   Allergen Reactions   . Codeine Nausea And Vomiting   . Sulfa Antibiotics Hives   . Augmentin [Amoxicillin-Pot Clavulanate] Nausea And Vomiting   . Neosporin [Neomycin-Bacitracin  Zn-Polymyx] Rash       Gyn History    Obstetric History     No data available     Patient's last menstrual period was 12/28/2013 (approximate).    Social History  History     Social History   . Marital Status: Single     Spouse Name: N/A     Number of Children: N/A   . Years of Education: N/A     Occupational History   . Not on file.     Social History Main Topics   . Smoking status: Never Smoker    . Smokeless tobacco: Not on file   . Alcohol Use: Yes      Comment: occasional   . Drug Use: No      Comment: birth control pills   . Sexual Activity:     Partners: Male     Birth Control/ Protection: OCP, Condom     Other Topics Concern   . Not on file     Social History Narrative  Family History  Family History   Problem Relation Age of Onset   . Hypertension Father    . Heart disease Father      pericarditis   . Stroke Paternal Grandmother    . Thyroid disease Mother    . Depression Brother        Review of Systems  CONST: No wt change, no f/c/s  EYES:  No blurry vision, double vision, loss of peripheral vision  HENT:  No hearing loss, tinnitus, allergies, teeth problems  CV:   No CP, palpitations, leg swelling  RESP:  No SOB, cough, wheeze  GI:   No n/v, diarrhea, constipation, abd pain, heartburn, blood in stool  GU:   No dysuria, frequency, incontinence, breast changes, abnormal vaginal bleeding or discharge  MS:  No joint or muscle pain  SKIN:   Has a rash mainly on face and upper body.   ENDO:  No heat/cold intolerance, menstrual changes, polyuria, polydipsia  HEME:  No bruising/bleeding, swollen glands  NEURO:  No HA, LH, dizziness, numbness/tingling, weakness  PSYCH:  No depressed mood, anhedonia. She is quite anxious              Review of Systems        Objective:    Physical Exam      Physical Exam:  BP 116/72   Pulse 73   Temp(Src) 97.8 F (36.6 C) (Oral)   Resp 16   Ht 1.575 m (5\' 2" )   Wt 71.396 kg (157 lb 6.4 oz)   BMI 28.78 kg/m2     SpO2 98%   LMP 12/28/2013     Wt Readings from Last 3  Encounters:   01/09/14 71.396 kg (157 lb 6.4 oz)   11/14/13 68.493 kg (151 lb)   09/28/13 65.772 kg (145 lb)     Vital signs reviewed  CONST: NAD, normal weight  HENT: B TMs normal, OP clear, normal dentition  EYES: PERRL, no pallor or scleral icterus, conjunctiva not injected  NECK: supple, no TM or nodules  LYMPH: no cervical or supraclavicular LAD  CV: RRR, no m/r/g.  No LE edema.  Pedal pulses present and equal.  RESP: CTAB, normal effort  GI: soft, NT/ND, NABS, no HSM  SKIN: warm, dry.  No  concerning moles. There is a red mostly macular with some scaling. Prominent on cheeks and nose and upper arms.   MSK: Grossly normal ROM and strength in all 4 extremities  NEURO: Cranial nerves 2-12 intact, normal gait  PSYCH: A&O x3, normal mood and affect    Assessment:       1. Contact dermatitis  predniSONE (DELTASONE) 20 MG tablet    doxepin (SINEQUAN) 25 MG capsule           Plan:      Proceduresno    Risks & benefits of the new medication(s) were explained to the pt, who appeared to understand and agrees to the treatment plan.  Patient instructions attached

## 2014-08-17 HISTORY — PX: PELVIC LAPAROSCOPY: SHX162

## 2015-01-20 ENCOUNTER — Encounter (INDEPENDENT_AMBULATORY_CARE_PROVIDER_SITE_OTHER): Payer: Self-pay | Admitting: Emergency Medicine

## 2015-01-20 ENCOUNTER — Ambulatory Visit (INDEPENDENT_AMBULATORY_CARE_PROVIDER_SITE_OTHER): Payer: Commercial Managed Care - POS

## 2015-01-20 ENCOUNTER — Ambulatory Visit (INDEPENDENT_AMBULATORY_CARE_PROVIDER_SITE_OTHER): Payer: Commercial Managed Care - POS | Admitting: Emergency Medicine

## 2015-01-20 VITALS — BP 128/79 | HR 87 | Temp 98.6°F | Resp 16 | Ht 62.0 in | Wt 138.0 lb

## 2015-01-20 DIAGNOSIS — J069 Acute upper respiratory infection, unspecified: Secondary | ICD-10-CM

## 2015-01-20 DIAGNOSIS — R059 Cough, unspecified: Secondary | ICD-10-CM

## 2015-01-20 DIAGNOSIS — R591 Generalized enlarged lymph nodes: Secondary | ICD-10-CM

## 2015-01-20 DIAGNOSIS — Z0289 Encounter for other administrative examinations: Secondary | ICD-10-CM

## 2015-01-20 MED ORDER — DEXTROMETHORPHAN-GUAIFENESIN ER 30-600 MG PO TB12
1.0000 | ORAL_TABLET | Freq: Two times a day (BID) | ORAL | Status: AC
Start: 2015-01-20 — End: ?

## 2015-01-20 MED ORDER — BECLOMETHASONE DIPROPIONATE 40 MCG/ACT IN AERS
2.0000 | INHALATION_SPRAY | Freq: Two times a day (BID) | RESPIRATORY_TRACT | Status: AC
Start: 2015-01-20 — End: 2016-01-20

## 2015-01-20 NOTE — Patient Instructions (Addendum)
Lymphadenopathy  Lymphadenopathy is swelling of the lymph nodes. Lymph nodes are small, bean-shaped glands around the body.  What are lymph nodes?  Lymph nodes are part of your immune system. The glands are found in your neck, armpits, groin, chest, and abdomen. They act as filters for lymph fluid as it flows through your body. Lymph fluid contains white blood cells and other things that fight infection.  Why lymph nodes swell  Lymphadenopathy is very common. The glands often enlarge during a viral or bacterial infection. It can happen during a cold, the flu, or strep throat. The nodes may swell in just one area of the body, such as the neck (localized). Or nodes may swell all over the body (generalized). The neck (cervical) lymph nodes are the most common site of lymphadenopathy.  What causes lymphadenopathy?  Dead cells and fluid build up in the lymph nodes as they help fight infection or disease. This causes them to swell in size. Enlarged lymph nodes are often near the source of infection. This can help to find the cause of an infection. For example, swollen lymph nodes around the jaw may be because of an infection in the teeth or mouth. But lymphadenopathy may also be generalized. This is common in some viral illnesses such as mononucleosis or chickenpox (varicella).  Lymphadenopathy can also be caused by:   Infection of a lymph node or small group of nodes (lymphadenitis)   Cancer   Reactions to medicines such as antibiotics and some seizure medicines   Other health conditions, such as lupus  Symptoms of lymphadenopathy  Lymphadenopathy can cause symptoms such as:  1. Lumps under the jaw, on the sides or back of the neck, in the armpits, in the groin, or in the chest or belly (abdomen)  2. Pain or tenderness in any of these areas  3. Redness or warmth in any of these areas  You may also have symptoms from an infection causing the swollen glands. These symptoms may include fever, sore throat, body aches,  or cough.  Diagnosing lymphadenopathy  Your health care provider will ask about your health history and symptoms. He or she will give you a physical exam and check the areas where lymph nodes are enlarged. Your health care provider will check the size and location of the nodes, and ask how long they have been swollen and if they are painful. Diagnostic tests and referral to specialists may be recommended. They may include:   Blood tests. These are done to check for signs of infection and other problems.   Urine test. This is also done to check for infection and other problems.   Chest X-ray. This test can show enlarged lymph nodes or other problems.   Lymph node biopsy. If lymph nodes are swollen for 3 to 4 weeks, they may be checked with a biopsy. Small samples of lymph node tissue are taken and checked in a lab for signs of cancer. You may be referred to a specialist in blood disorders and cancer (hematologist and oncologist).  Treatment for lymphadenopathy  The treatment of enlarged lymph nodes depends on the cause. Enlarged lymph nodes are often harmless and go away without any treatment. Treatment is most often done on the cause of the enlarged nodes and may include:   Antibiotic medicine to treat a bacterial infection   Incision and drainage (I & D) of a lymph node for lymphadenitis   Other medicines or procedures to treat the cause of the enlarged nodes  You may need follow-up exam in 3 to 4 weeks to recheck enlarged nodes.          [address] [employer] [address], [place], [place] [address]. All rights reserved. This information is not intended as a substitute for professional medical care. Always follow your healthcare professional's instructions.        Caring for Your Inhaler  Your health care provider may prescribe medicine that you breathe in using a metered-dose inhaler. It is important to keep it clean. You should also keep track ofhow much medicine is left in the  canister, so you'll never run out.    Keeping your inhaler clean   Take offthecanister, the part with the medicine,and cap from the mouthpiece.   Do not wash the canister orput it in water.   Run warm water through the mouthpiece for about a minute.   Shake off the water and let it air-dry.   If you needto use it before it is dry, shake offany water andreplace the canister. Test spray it away from you to make sure it works.   If you use a spacer, cleanit with warm water and a small amount of mild dish soap. do this once every week or two.   Make sure you check the package insertfor special instructions. The insert is the information that comes with the medicine. It may tell you how to take care of and clean your spacer.  When to replace your inhaler  Each inhaler is good for only a certain number of puffs of medicine. After those puffs are used up, any puffs left will not give you the amount of medicine you need. To be sure you'll get enough medicine when you need it, keep track of how many puffs you use. Here's an easy way to keep track of the medicine in your inhaler:  4. Find the number on the canister that tells you how many puffs it contains.  5. Divide this number by how many puffs you are told to use in one day. This gives you the number of days your medicine should last.  6. Use your calendar to find out what date your medicine will run out. Loraine Leriche it on the canister and on your calendar.  Be sure toget a refill of your medicinesbefore you run out.Some inhalers havedose counters to track the amount of medicine used.  Sample for you to fill in:  ____________  Number of puffs in new canister / ____________  Number of puffs you use each day = ____________  Number of days medicine will last   Note:Remember that your medicine willnot last longif you use your inhaler more often than planned.   For example, if your new canister holds 200 puffs and you've been told to use 4 puffs a day:  200  4  = 50 days   2000-2015 [employer], [place]. [address] [place], [place] [address]. All rights reserved. This information is not intended as a substitute for professional medical care. Always follow your healthcare professional's instructions.

## 2015-01-20 NOTE — Progress Notes (Signed)
Subjective:       Patient ID: Krystal Mills is a 24 y.o. female.    HPI  Chief Complaint   Patient presents with   . Lymphadenopathy     pt has been fighting with cold for 4 weeks and noticed her lymph gland under her armpit is swollen. she has been coughing  for 4 weeks.    24yo female with pmh of asthma who presents with cc of URI symptoms for 3-4 weeks. Pt states that she have been fighting a cold for 4 weeks, with persistent cough hat it is not improving. Noticed that her lymph nodes under her armpits were swollen and her b/l  chest hurts when she touches it or cough. Cough is dry, non-productive. Pt reports myalgia.  The following portions of the patient's history were reviewed and updated as appropriate: allergies, current medications, past family history, past medical history, past social history, past surgical history and problem list.    Review of Systems   Constitutional: Negative.  Negative for fever and chills.   HENT: Positive for sore throat. Negative for congestion, rhinorrhea and trouble swallowing.    Eyes: Negative.    Respiratory: Positive for cough. Negative for chest tightness, wheezing and stridor.    Musculoskeletal: Positive for myalgias.   Hematological: Positive for adenopathy.        Neck glands and axilla   All other systems reviewed and are negative.          Objective:     Physical Exam   Nursing note and vitals reviewed.  Constitutional: She is oriented to person, place, and time. She appears well-developed and well-nourished. No distress.   HENT:   Head: Normocephalic and atraumatic.   Right Ear: External ear normal.   Left Ear: External ear normal.   Mouth/Throat: Oropharynx is clear and moist. No oropharyngeal exudate or tonsillar abscesses.   Eyes: Conjunctivae and EOM are normal. Pupils are equal, round, and reactive to light. Right eye exhibits no discharge. Left eye exhibits no discharge.   Neck: Normal range of motion. Neck supple. No JVD present.   Cardiovascular: Normal rate,  regular rhythm, normal heart sounds and intact distal pulses.    No murmur heard.  Pulmonary/Chest: Effort normal and breath sounds normal. No stridor. No respiratory distress. She has no wheezes. She has no rales. She exhibits tenderness.   Lymphadenopathy:     She has cervical adenopathy.        Right cervical: Superficial cervical adenopathy present.        Left cervical: Superficial cervical adenopathy present.     She has axillary adenopathy.        Left axillary: Pectoral adenopathy present.   Neurological: She is alert and oriented to person, place, and time.   Skin: Skin is warm and dry.     Radiology Results (24 Hour)     Procedure Component Value Units Date/Time    X-ray chest PA and lateral [[id number]] Collected:  01/20/15 1030    Order Status:  Completed Updated:  01/20/15 1035    Narrative:      HISTORY: 24 year old female with pleuritic chest pain for 3 weeks and  coughing.    COMPARISON: None.    TECHNIQUE: PA and lateral projections of the chest.    FINDINGS:    Cardiac size and mediastinal contours are normal. Pulmonary vascularity  is normal, and the lungs are clear. There is no pneumothorax or pleural  effusion. No acute osseous abnormality is identified.  Impression:        1. Clear lungs.  2. Normal heart size.    Elizebeth Koller, MD   01/20/2015 10:30 AM              Assessment:     URI  Ddx: cough variant asthma, viral URI       Plan:     cbc  Chest xray: no infiltrate  Symptomatic care  Continue albuterol mdi  mucinex DM for coughing  Trial of Inhaled steroid  F/u with PCP if not improving

## 2015-01-22 ENCOUNTER — Telehealth (INDEPENDENT_AMBULATORY_CARE_PROVIDER_SITE_OTHER): Payer: Self-pay

## 2015-01-22 LAB — CBC AND DIFFERENTIAL
Atypical Lymphocytes %: 0 %
Baso(Absolute): 43 cells/uL (ref 0–200)
Basophils: 0.5 %
Eosinophils Absolute: 155 cells/uL (ref 15–500)
Eosinophils: 1.8 %
Hematocrit: 43 % (ref 35.0–45.0)
Hemoglobin: 13.8 g/dL (ref 11.7–15.5)
Lymphocytes Absolute: 3199 cells/uL (ref 850–3900)
Lymphocytes: 37.2 %
MCH: 30.8 pg (ref 27–33)
MCHC: 32.1 g/dL (ref 32–36)
MCV: 96 fL (ref 80–100)
MPV: 8.8 fL (ref 7.5–11.5)
Monocytes Absolute: 327 cells/uL (ref 200–950)
Monocytes: 3.8 %
Neutrophils Absolute: 4876 cells/uL (ref 1500–7800)
Neutrophils: 56.7 %
Platelets: 265 10*3/uL (ref 140–400)
RBC: 4.48 10*6/uL (ref 3.80–5.10)
RDW: 15.3 % — ABNORMAL HIGH (ref 11.0–15.0)
WBC: 8.6 10*3/uL (ref 3.8–10.8)

## 2015-01-22 NOTE — Telephone Encounter (Signed)
-----   Message from Daneil Dan, MD sent at 01/22/2015  8:03 AM EDT -----  Labs are essentially normal.    Advise to continue plan.  Follow up with pcp.

## 2015-01-23 NOTE — Addendum Note (Signed)
Addended by: Fabian November on: 01/23/2015 11:09 AM     Modules accepted: Level of Service

## 2015-06-11 ENCOUNTER — Ambulatory Visit (INDEPENDENT_AMBULATORY_CARE_PROVIDER_SITE_OTHER): Payer: Commercial Managed Care - POS | Admitting: Family Medicine

## 2015-06-11 ENCOUNTER — Encounter (INDEPENDENT_AMBULATORY_CARE_PROVIDER_SITE_OTHER): Payer: Self-pay | Admitting: Family Medicine

## 2015-06-11 VITALS — BP 123/83 | HR 97 | Temp 99.2°F | Resp 16 | Ht 62.0 in | Wt 145.4 lb

## 2015-06-11 DIAGNOSIS — K3580 Unspecified acute appendicitis: Secondary | ICD-10-CM

## 2015-06-11 NOTE — Progress Notes (Signed)
Have you seen any new specialist/physicians since you were last here?    No       Limb alert protocol reviewed? Yes or No   Yes     Pt declined flu shot

## 2015-06-11 NOTE — Progress Notes (Signed)
Subjective:       Patient ID: Krystal Mills is a 24 y.o. female.    Abdominal Pain  This is a new problem. The current episode started in the past 7 days. The onset quality is sudden. The problem occurs constantly. The problem has been gradually worsening. The pain is located in the RLQ. The pain is at a severity of 6/10. The pain is severe. The quality of the pain is colicky, cramping and sharp. The abdominal pain does not radiate. Associated symptoms include anorexia and nausea. Pertinent negatives include no arthralgias, belching, constipation, diarrhea, dysuria, fever, flatus, frequency, headaches, hematochezia, hematuria, melena, myalgias, vomiting or weight loss. The pain is aggravated by certain positions, movement and palpation. The pain is relieved by nothing. There is no history of abdominal surgery, colon cancer, Crohn's disease, gallstones, GERD, irritable bowel syndrome, pancreatitis, PUD or ulcerative colitis.       Started Sunday morning when she was in [place]. She did eat breakfast. After that had no appetite. THen she developed diarrhea. She chalked it up to vacation food and drink. Has had lower abdominal/pelvic cramping for weeks leading up to this. When she got off the plane, she was home Monday evening and was really just generally not feeling well. Still not feeling well yesterday, but went to work yesterday.  Then got really sharp pain that stopped it in her tracks. Continues to have severe pain.    Review of Systems   Constitutional: Negative for fever and weight loss.   Gastrointestinal: Positive for nausea, abdominal pain and anorexia. Negative for vomiting, diarrhea, constipation, melena, hematochezia and flatus.   Genitourinary: Negative for dysuria, frequency and hematuria.   Musculoskeletal: Negative for myalgias and arthralgias.   Neurological: Negative for headaches.           Objective:    Physical Exam   Constitutional: She is oriented to person, place, and time. She appears  well-developed and well-nourished. She appears distressed.   HENT:   Head: Normocephalic and atraumatic.   Mouth/Throat: No oropharyngeal exudate.   Eyes: Conjunctivae and EOM are normal. Pupils are equal, round, and reactive to light. No scleral icterus.   Neck: Normal range of motion. Neck supple. No thyromegaly present.   Cardiovascular: Normal rate and regular rhythm.    Pulmonary/Chest: Effort normal and breath sounds normal. No respiratory distress.   Abdominal: There is tenderness. There is rebound and guarding.   All in right lower quadrant   Musculoskeletal: Normal range of motion. She exhibits no edema or tenderness.   Lymphadenopathy:     She has no cervical adenopathy.   Neurological: She is alert and oriented to person, place, and time. She has normal reflexes. No cranial nerve deficit.   Skin: She is not diaphoretic.   Vitals reviewed.        BP 123/83 mmHg  Pulse 97  Temp(Src) 99.2 F (37.3 C)  Resp 16  Ht 1.575 m (5\' 2" )  Wt 65.953 kg (145 lb 6.4 oz)  BMI 26.59 kg/m2  SpO2 99%    Assessment:       Pt is instructed to go to the ED at the nearest hospital without delay.      Plan:      Procedures    As above

## 2017-03-03 ENCOUNTER — Encounter (HOSPITAL_COMMUNITY): Payer: Self-pay | Admitting: *Deleted

## 2017-03-03 ENCOUNTER — Emergency Department (HOSPITAL_COMMUNITY)
Admission: EM | Admit: 2017-03-03 | Discharge: 2017-03-03 | Disposition: A | Discharge status: Home / Self Care | Payer: Commercial Managed Care - PPO | Attending: Emergency Medicine | Admitting: Emergency Medicine

## 2017-03-03 DIAGNOSIS — W541XXA Struck by dog, initial encounter: Secondary | ICD-10-CM | POA: Diagnosis not present

## 2017-03-03 DIAGNOSIS — Y939 Activity, unspecified: Secondary | ICD-10-CM | POA: Insufficient documentation

## 2017-03-03 DIAGNOSIS — S0992XA Unspecified injury of nose, initial encounter: Secondary | ICD-10-CM

## 2017-03-03 DIAGNOSIS — S060X0A Concussion without loss of consciousness, initial encounter: Secondary | ICD-10-CM | POA: Diagnosis not present

## 2017-03-03 DIAGNOSIS — S0990XA Unspecified injury of head, initial encounter: Secondary | ICD-10-CM | POA: Diagnosis present

## 2017-03-03 DIAGNOSIS — Y929 Unspecified place or not applicable: Secondary | ICD-10-CM | POA: Insufficient documentation

## 2017-03-03 DIAGNOSIS — Y999 Unspecified external cause status: Secondary | ICD-10-CM | POA: Diagnosis not present

## 2017-03-03 HISTORY — DX: Endometriosis, unspecified: N80.9

## 2017-03-03 HISTORY — DX: Migraine, unspecified, not intractable, without status migrainosus: G43.909

## 2017-03-03 MED ORDER — MECLIZINE HCL 25 MG PO TABS
25.0000 mg | ORAL_TABLET | Freq: Once | ORAL | Status: AC
  Administered 2017-03-03: 25 mg via ORAL
  Filled 2017-03-03: qty 1

## 2017-03-03 MED ORDER — MECLIZINE HCL 25 MG PO TABS: 25.0000 mg | ORAL_TABLET | Freq: Three times a day (TID) | ORAL | 0 refills | Status: DC | PRN

## 2017-03-03 MED ORDER — IBUPROFEN 800 MG PO TABS
800.0000 mg | ORAL_TABLET | Freq: Once | ORAL | Status: AC
  Administered 2017-03-03: 800 mg via ORAL
  Filled 2017-03-03: qty 1

## 2017-03-03 MED ORDER — MELOXICAM 15 MG PO TABS: 15.0000 mg | ORAL_TABLET | Freq: Every day | ORAL | 0 refills | Status: DC

## 2017-03-03 NOTE — Discharge Instructions (Signed)
°  Get help right away if: You have severe or worsening headaches. You have weakness or numbness in any part of your body. Your coordination gets worse. You vomit repeatedly. You are sleepier. The pupil of one eye is larger than the other. You have convulsions or a seizure. Your speech is slurred. Your fatigue, confusion, or irritability gets worse. You cannot recognize people or places. You have neck pain. It is difficult to wake you up. You have unusual behavior changes. You lose consciousness.  Follow these instructions at home: If directed, put ice on the injured area: Put ice in a plastic bag. Place a towel between your skin and the bag. Leave the ice on for 20 minutes, 2-3 times per day. Take over-the-counter and prescription medicines only as told by your doctor. If your nose bleeds, sit up while you gently squeeze your nose shut for 10 minutes. Try to not blow your nose. Return to your normal activities as told by your doctor. Ask your doctor what activities are safe for you. Do not play contact sports for 3-4 weeks or as told by your doctor. Keep all follow-up visits as told by your doctor. This is important. Contact a doctor if: You have more pain or very bad pain. You keep having nosebleeds. The shape of your nose does not return to normal after 5 days. You have pus coming out of your nose. Get help right away if: Your nose bleeds for more than 20 minutes. You have clear fluid draining out of your nose. You have a grape-like swelling on the inside of your nose. You have trouble moving your eyes. You keep throwing up (vomiting).

## 2017-03-03 NOTE — ED Notes (Signed)
Pt is in stable condition upon d/c and ambulates from ED. 

## 2017-03-03 NOTE — ED Notes (Signed)
ED Provider at bedside. 

## 2017-03-03 NOTE — ED Triage Notes (Signed)
Pt was bending over when her dog threw it's head back and smacked her in the face.  No loc, but nitially L pupil larger than R, though both reactive to light.  Both pupils now equal and reactive at a 4. Pt now feels dizzy and nauseated with frontal headache.

## 2017-03-03 NOTE — ED Provider Notes (Signed)
[facility]-EMERGENCY DEPT Provider Note   CSN: [id number] Arrival date & time: 03/03/17  0759     History   Chief Complaint Chief Complaint  Patient presents with  . Head Injury    HPI Destiny Ellis is a 26 y.o. female who presents To the emergency department with chief complaint of head injury. The patient states that her dog jumped up and they collided heads. She had immediate searing pain in her nose and face. She has mild nausea and slight headache. She denies ataxia, vision changes, inability to breathe through nose, vomiting, other neurologic symptoms. Her husband feels that she needed to come in to rule out a concussion. HPI  Past Medical History:  Diagnosis Date  . Endometriosis   . Migraines     There are no active problems to display for this patient.   Past Surgical History:  Procedure Laterality Date  . laporoscopy    . TONSILLECTOMY      OB History    No data available       Home Medications    Prior to Admission medications   Not on File    Family History No family history on file.  Social History Social History  Substance Use Topics  . Smoking status: Never Smoker  . Smokeless tobacco: Never Used  . Alcohol use Yes     Comment: occ     Allergies   Sulfa antibiotics   Review of Systems Review of Systems Ten systems reviewed and are negative for acute change, except as noted in the HPI.    Physical Exam Updated Vital Signs BP 117/80   Pulse 82   Temp 98.7 F (37.1 C) (Oral)   Resp 16   Ht 5' 2.5" (1.588 m)   Wt 65.8 kg (145 lb)   LMP 03/03/2016   SpO2 99%   BMI 26.10 kg/m   Physical Exam  Constitutional: She is oriented to person, place, and time. She appears well-developed and well-nourished. No distress.  HENT:  Head: Normocephalic and atraumatic.  Mild swelling of the Nasal bridge Mild bruising. No signs of septal hematoma, no epistaxis, able to breathe through each naris. No step-offs or deformities.  Eyes: Pupils are  equal, round, and reactive to light. Conjunctivae and EOM are normal. No scleral icterus.  EOM without pain, Pupils equal, round and reactive to light. No any subchorionic  Neck: Normal range of motion.  Cardiovascular: Normal rate, regular rhythm and normal heart sounds.  Exam reveals no gallop and no friction rub.   No murmur heard. Pulmonary/Chest: Effort normal and breath sounds normal. No respiratory distress.  Abdominal: Soft. Bowel sounds are normal. She exhibits no distension and no mass. There is no tenderness. There is no guarding.  Neurological: She is alert and oriented to person, place, and time.  Skin: Skin is warm and dry. She is not diaphoretic.  Psychiatric: Her behavior is normal.  Nursing note and vitals reviewed.    ED Treatments / Results  Labs (all labs ordered are listed, but only abnormal results are displayed) Labs Reviewed - No data to display  EKG  EKG Interpretation None       Radiology No results found.  Procedures Procedures (including critical care time)  Medications Ordered in ED Medications - No data to display   Initial Impression / Assessment and Plan / ED Course  I have reviewed the triage vital signs and the nursing notes.  Pertinent labs & imaging results that were available during my care  of the patient were reviewed by me and considered in my medical decision making (see chart for details).   Nasal injury without signs of fracture. Nasal injury present, no signs of fracture. Patient symptoms consistent with concussion. No vomiting. No focal neurological deficits on physical exam.  Pt observed in the ED.  {CT is not indicated at this time. Discussed symptoms of post concussive syndrome and reasons to return to the emergency department including any new  severe headaches, disequilibrium, vomiting, double vision, extremity weakness, difficulty ambulating, or any other concerning symptoms. Patient will be discharged with information  pertaining to diagnosis. Pt is safe for discharge at this time.   Final Clinical Impressions(s) / ED Diagnoses   Final diagnoses:  Concussion without loss of consciousness, initial encounter  Nasal injury, initial encounter    New Prescriptions New Prescriptions   No medications on file     Arthor CaptainHarris, Alica Shellhammer, PA-C 03/03/17 1010    Mesner, Barbara CowerJason, MD 03/03/17 1504

## 2017-03-11 ENCOUNTER — Ambulatory Visit (INDEPENDENT_AMBULATORY_CARE_PROVIDER_SITE_OTHER): Payer: Commercial Managed Care - PPO | Admitting: Neurology

## 2017-03-11 ENCOUNTER — Encounter: Payer: Self-pay | Admitting: Neurology

## 2017-03-11 DIAGNOSIS — G43709 Chronic migraine without aura, not intractable, without status migrainosus: Secondary | ICD-10-CM

## 2017-03-11 MED ORDER — ALMOTRIPTAN MALATE 6.25 MG PO TABS: 6.2500 mg | ORAL_TABLET | ORAL | 11 refills | Status: DC | PRN

## 2017-03-11 MED ORDER — PROPRANOLOL HCL 10 MG PO TABS: 10.0000 mg | ORAL_TABLET | Freq: Two times a day (BID) | ORAL | 6 refills | Status: DC

## 2017-03-11 NOTE — Progress Notes (Addendum)
[facility]    Provider:  Dr Lucia GaskinsAhern Referring Provider: Darrow BussingKoirala, Dibas, MD Primary Care Physician:  Darrow BussingKoirala, Dibas, MD  CC:  Migraine  HPI:  Destiny Ellis is a 26 y.o. female here as a referral from Dr. Docia ChuckKoirala for migraines. Past medical history of major depression, generalized anxiety disorder, irritable bowel syndrome, endometriosis, chronic migraine.  Migraines since the 4th grade. Sleeping helps when she has one. Worsened in HS. Father with migraines and cluster headaches. 3 years ago migraines worsening(20 headache days a months, >12 migrainous), 3 migraine days a week since then at least , MRI of the brain was negative. She started Topiramate. She was on 100mg  daily. Topiramate was making her foggy. Migraines without aura, start behind the right eye, the right side of her face throbs, worse around the eye, her whole face is throbbing and her eye droops. Light, sound, smells bother her with nausea and can last 24 hours and be severe. 20 days out of month or more will have headaches. Most (>12 headache days a month) headaches are migrainous. She is missing work. Alcohol triggers. She has a lot of nausea and anxiety. No medication overuse. Her dog also hit her in the face last week. She has had 3 concussions in 2004, 2009. Since her dog hit her she has been having more headaches.No other focal neurologic deficits, associated symptoms, inciting events or modifiable factors. Husband here with patient and also provides information.  Meds tried: Imitrex. Maxalt, Topiramate, propranolol, zoloft  Reviewed notes, labs and imaging from outside physicians, which showed:   Reviewed primary care notes from the physician. CMP normal with BUN 14 and creatinine 0.87 June 2018  Patient seen in the ED 03/03/2017 for concussion. Collided heads with her dog. Immediate searing pain in her nose and head, mild nausea, no vision changes or focal neuro symptoms. She had mild bruising on her nasal  bridge.otherwise normal exam. No neurologic deficits. Brain imaging not indicated. Discharged.   Review of Systems: Patient complains of symptoms per HPI as well as the following symptoms: no CP, no SOB. Pertinent negatives and positives per HPI. All others negative.   Social History   Social History  . Marital status: Significant Other    Spouse name: N/A  . Number of children: N/A  . Years of education: N/A   Occupational History  . Not on file.   Social History Main Topics  . Smoking status: Never Smoker  . Smokeless tobacco: Never Used  . Alcohol use Yes     Comment: occasional  . Drug use: No  . Sexual activity: Not on file   Other Topics Concern  . Not on file   Social History Narrative   ** Merged History Encounter **        Family History  Problem Relation Age of Onset  . Hyperthyroidism Mother   . Hypertension Father   . Bipolar disorder Father   . Migraines Father   . Healthy Brother     Past Medical History:  Diagnosis Date  . Endometriosis   . Headache   . Migraines   . Vision abnormalities     Past Surgical History:  Procedure Laterality Date  . LAPAROSCOPY ABDOMEN DIAGNOSTIC    . laporoscopy    . TONSILLECTOMY      Current Outpatient Prescriptions  Medication Sig Dispense Refill  . buPROPion (WELLBUTRIN SR) 150 MG 12 hr tablet Take 150 mg by mouth daily.    . cetirizine (ZYRTEC) 10 MG tablet  Take 10 mg by mouth daily.    . clonazePAM (KLONOPIN) 0.5 MG tablet Take 0.5 mg by mouth daily.    Marland Kitchen. Ketorolac Tromethamine (TORADOL ORAL PO) Take 30 mg by mouth 3 times/day as needed-between meals & bedtime.    . montelukast (SINGULAIR) 10 MG tablet Take 10 mg by mouth at bedtime.    . ondansetron (ZOFRAN) 8 MG tablet Take 8 mg by mouth every 8 (eight) hours as needed for nausea or vomiting.    . rizatriptan (MAXALT-MLT) 10 MG disintegrating tablet Take 10 mg by mouth as needed for migraine. May repeat in 2 hours if needed    . sertraline (ZOLOFT)  50 MG tablet Take 50 mg by mouth daily.    Marland Kitchen. spironolactone (ALDACTONE) 25 MG tablet Take 25 mg by mouth daily.    . traMADol (ULTRAM) 50 MG tablet Take by mouth every 6 (six) hours as needed.    Marland Kitchen. almotriptan (AXERT) 6.25 MG tablet Take 1 tablet (6.25 mg total) by mouth as needed for migraine. may repeat in 2 hours if needed 10 tablet 11  . meclizine (ANTIVERT) 25 MG tablet Take 1 tablet (25 mg total) by mouth 3 (three) times daily as needed for dizziness. 30 tablet 0  . meloxicam (MOBIC) 15 MG tablet Take 1 tablet (15 mg total) by mouth daily. Take 1 daily with food. 10 tablet 0  . propranolol (INDERAL) 10 MG tablet Take 1 tablet (10 mg total) by mouth 2 (two) times daily. 60 tablet 6   No current facility-administered medications for this visit.     Allergies as of 03/11/2017 - Review Complete 03/11/2017  Allergen Reaction Noted  . Sulfa antibiotics  03/03/2017  . Sulfa antibiotics Hives 03/11/2017    Vitals: BP 113/75   Pulse 91   Resp 16   Ht 5\' 3"  (1.6 m)   Wt 148 lb 3.2 oz (67.2 kg)   BMI 26.25 kg/m  Last Weight:  Wt Readings from Last 1 Encounters:  03/11/17 148 lb 3.2 oz (67.2 kg)   Last Height:   Ht Readings from Last 1 Encounters:  03/11/17 5\' 3"  (1.6 m)    Physical exam: Exam: Gen: NAD, conversant, well nourised, well groomed                     CV: RRR, no MRG. No Carotid Bruits. No peripheral edema, warm, nontender Eyes: Conjunctivae clear without exudates or hemorrhage  Neuro: Detailed Neurologic Exam  Speech:    Speech is normal; fluent and spontaneous with normal comprehension.  Cognition:    The patient is oriented to person, place, and time;     recent and remote memory intact;     language fluent;     normal attention, concentration,     fund of knowledge Cranial Nerves:    The pupils are equal, round, and reactive to light. The fundi are normal and spontaneous venous pulsations are present. Visual fields are full to finger confrontation.  Extraocular movements are intact. Trigeminal sensation is intact and the muscles of mastication are normal. The face is symmetric. The palate elevates in the midline. Hearing intact. Voice is normal. Shoulder shrug is normal. The tongue has normal motion without fasciculations.   Coordination:    Normal finger to nose and heel to shin. Normal rapid alternating movements.   Gait:    Heel-toe and tandem gait are normal.   Motor Observation:    No asymmetry, no atrophy, and no involuntary movements  noted. Tone:    Normal muscle tone.    Posture:    Posture is normal. normal erect    Strength:    Strength is V/V in the upper and lower limbs.      Sensation: intact to LT     Reflex Exam:  DTR's:    Deep tendon reflexes in the upper and lower extremities are normal bilaterally.   Toes:    The toes are downgoing bilaterally.   Clonus:    Clonus is absent.      Assessment/Plan:  26 year old with chronic migraines w/o aura, failed at least 3 classes of medications  Migraine preventative: increase Propranolol Acute Management migraine: [place]  Integrative Therapies:  Cervical myofascial pain, forward posture contributing to migraines and cervicalgia. Please evaluate and treat including dry needling, stretching, strengthening, manual therapy/massage, heating, TENS unit, exercising for scapular stabilization, pectoral stretching and rhomboid strengthening as clinically warranted as well as any other modality as recommended by evaluation. Patient also has TMJ and PT will be needed for this. Significant anxiety and stress, muscle tension.     Discussed: To prevent or relieve headaches, try the following: Cool Compress. Lie down and place a cool compress on your head.  Avoid headache triggers. If certain foods or odors seem to have triggered your migraines in the past, avoid them. A headache diary might help you identify triggers.  Include physical activity in your daily routine. Try a  daily walk or other moderate aerobic exercise.  Manage stress. Find healthy ways to cope with the stressors, such as delegating tasks on your to-do list.  Practice relaxation techniques. Try deep breathing, yoga, massage and visualization.  Eat regularly. Eating regularly scheduled meals and maintaining a healthy diet might help prevent headaches. Also, drink plenty of fluids.  Follow a regular sleep schedule. Sleep deprivation might contribute to headaches Consider biofeedback. With this mind-body technique, you learn to control certain bodily functions - such as muscle tension, heart rate and blood pressure - to prevent headaches or reduce headache pain.    Proceed to emergency room if you experience new or worsening symptoms or symptoms do not resolve, if you have new neurologic symptoms or if headache is severe, or for any concerning symptom.   Provided education and documentation from American headache Society toolbox including articles on: chronic migraine medication overuse headache, chronic migraines, prevention of migraines, behavioral and other nonpharmacologic treatments for headache.   Cc: Darrow Bussing, MD Naomie Dean, MD  [facility] [address] [place], [place] [address]  Phone [phone] Fax [phone]

## 2017-03-11 NOTE — Patient Instructions (Addendum)
Diclofenac powder for oral solution What is this medicine? DICLOFENAC (dye KLOE fen ak) is a non-steroidal anti-inflammatory drug (NSAID). It is used to treat migraine pain. This medicine may be used for other purposes; ask your health care provider or pharmacist if you have questions. COMMON BRAND NAME(S): Cambia What should I tell my health care provider before I take this medicine? They need to know if you have any of these conditions: -asthma, especially aspirin sensitive asthma -coronary artery bypass graft (CABG) surgery within the past 2 weeks -drink more than 3 alcohol-containing drinks a day -heart disease or circulation problems like heart failure or leg edema (fluid retention) -high blood pressure -kidney disease -liver disease -phenylketonuria -stomach problems -an unusual or allergic reaction to diclofenac, aspirin, other NSAIDs, other medicines, foods, dyes, or preservatives -pregnant or trying to get pregnant -breast-feeding How should I use this medicine? Mix this medicine with 1 to 2 ounces of water. Drink the medicine and water together. Follow the directions on the prescription label. Do not take your medicine more often than directed. Long-term, continuous use may increase the risk of heart attack or stroke. A special MedGuide will be given to you by the pharmacist with each prescription and refill. Be sure to read this information carefully each time. Talk to your pediatrician regarding the use of this medicine in children. Special care may be needed. Elderly patients over 41 years old may have a stronger reaction and need a smaller dose. Overdosage: If you think you have taken too much of this medicine contact a poison control center or emergency room at once. NOTE: This medicine is only for you. Do not share this medicine with others. What if I miss a dose? This does not apply. What may interact with this medicine? Do not take this medicine with any of the  following medications: -cidofovir -ketorolac -methotrexate This medicine may also interact with the following medications: -alcohol -aspirin and aspirin-like medicines -cyclosporine -diuretics -lithium -medicines for blood pressure -medicines for osteoporosis -medicines that affect platelets -medicines that treat or prevent blood clots like warfarin -NSAIDs, medicines for pain and inflammation, like ibuprofen or naproxen -pemetrexed -steroid medicines like prednisone or cortisone This list may not describe all possible interactions. Give your health care provider a list of all the medicines, herbs, non-prescription drugs, or dietary supplements you use. Also tell them if you smoke, drink alcohol, or use illegal drugs. Some items may interact with your medicine. What should I watch for while using this medicine? Tell your doctor or health care professional if your pain does not get better. Talk to your doctor before taking another medicine for pain. Do not treat yourself. This medicine does not prevent heart attack or stroke. In fact, this medicine may increase the chance of a heart attack or stroke. The chance may increase with longer use of this medicine and in people who have heart disease. If you take aspirin to prevent heart attack or stroke, talk with your doctor or health care professional. Do not take medicines such as ibuprofen and naproxen with this medicine. Side effects such as stomach upset, nausea, or ulcers may be more likely to occur. Many medicines available without a prescription should not be taken with this medicine. This medicine can cause ulcers and bleeding in the stomach and intestines at any time during treatment. Do not smoke cigarettes or drink alcohol. These increase irritation to your stomach and can make it more susceptible to damage from this medicine. Ulcers and bleeding can happen  without warning symptoms and can cause death. You may get drowsy or dizzy. Do not  drive, use machinery, or do anything that needs mental alertness until you know how this medicine affects you. Do not stand or sit up quickly, especially if you are an older patient. This reduces the risk of dizzy or fainting spells. This medicine can cause you to bleed more easily. Try to avoid damage to your teeth and gums when you brush or floss your teeth. If you take migraine medicines for 10 or more days a month, your migraines may get worse. Keep a diary of headache days and medicine use. Contact your healthcare professional if your migraine attacks occur more frequently. What side effects may I notice from receiving this medicine? Side effects that you should report to your doctor or health care professional as soon as possible: -allergic reactions like skin rash, itching or hives, swelling of the face, lips, or tongue -black or bloody stools, blood in the urine or vomit -blurred vision -chest pain -difficulty breathing or wheezing -nausea or vomiting -fever -redness, blistering, peeling or loosening of the skin, including inside the mouth -slurred speech or weakness on one side of the body -trouble passing urine or change in the amount of urine -unexplained weight gain or swelling -unusually weak or tired -yellowing of eyes or skin Side effects that usually do not require medical attention (report to your doctor or health care professional if they continue or are bothersome): -constipation -diarrhea -dizziness -headache -heartburn This list may not describe all possible side effects. Call your doctor for medical advice about side effects. You may report side effects to FDA at 1-800-FDA-1088. Where should I keep my medicine? Keep out of the reach of children. Store at room temperature between 15 and 30 degrees C (59 and 86 degrees F). Throw away any unused medicine after the expiration date. NOTE: This sheet is a summary. It may not cover all possible information. If you have  questions about this medicine, talk to your doctor, pharmacist, or health care provider.  2018 [employer]/Gold Standard (2015-09-05 09:56:49)  Propranolol tablets What is this medicine? PROPRANOLOL (proe PRAN oh lole) is a beta-blocker. Beta-blockers reduce the workload on the heart and help it to beat more regularly. This medicine is used to treat high blood pressure, to control irregular heart rhythms (arrhythmias) and to relieve chest pain caused by angina. It may also be helpful after a heart attack. This medicine is also used to prevent migraine headaches, relieve uncontrollable shaking (tremors), and help certain problems related to the thyroid gland and adrenal gland. This medicine may be used for other purposes; ask your health care provider or pharmacist if you have questions. COMMON BRAND NAME(S): Inderal What should I tell my health care provider before I take this medicine? They need to know if you have any of these conditions: -circulation problems or blood vessel disease -diabetes -history of heart attack or heart disease, vasospastic angina -kidney disease -liver disease -lung or breathing disease, like asthma or emphysema -pheochromocytoma -slow heart rate -thyroid disease -an unusual or allergic reaction to propranolol, other beta-blockers, medicines, foods, dyes, or preservatives -pregnant or trying to get pregnant -breast-feeding How should I use this medicine? Take this medicine by mouth with a glass of water. Follow the directions on the prescription label. Take your doses at regular intervals. Do not take your medicine more often than directed. Do not stop taking except on your the advice of your doctor or health care professional. Talk  to your pediatrician regarding the use of this medicine in children. Special care may be needed. Overdosage: If you think you have taken too much of this medicine contact a poison control center or emergency room at once. NOTE: This  medicine is only for you. Do not share this medicine with others. What if I miss a dose? If you miss a dose, take it as soon as you can. If it is almost time for your next dose, take only that dose. Do not take double or extra doses. What may interact with this medicine? Do not take this medicine with any of the following medications: -feverfew -phenothiazines like chlorpromazine, mesoridazine, prochlorperazine, thioridazine This medicine may also interact with the following medications: -aluminum hydroxide gel -antipyrine -antiviral medicines for HIV or AIDS -barbiturates like phenobarbital -certain medicines for blood pressure, heart disease, irregular heart beat -cimetidine -ciprofloxacin -diazepam -fluconazole -haloperidol -isoniazid -medicines for cholesterol like cholestyramine or colestipol -medicines for mental depression -medicines for migraine headache like almotriptan, eletriptan, frovatriptan, naratriptan, rizatriptan, sumatriptan, zolmitriptan -NSAIDs, medicines for pain and inflammation, like ibuprofen or naproxen -phenytoin -rifampin -teniposide -theophylline -thyroid medicines -tolbutamide -warfarin -zileuton This list may not describe all possible interactions. Give your health care provider a list of all the medicines, herbs, non-prescription drugs, or dietary supplements you use. Also tell them if you smoke, drink alcohol, or use illegal drugs. Some items may interact with your medicine. What should I watch for while using this medicine? Visit your doctor or health care professional for regular check ups. Check your blood pressure and pulse rate regularly. Ask your health care professional what your blood pressure and pulse rate should be, and when you should contact them. You may get drowsy or dizzy. Do not drive, use machinery, or do anything that needs mental alertness until you know how this drug affects you. Do not stand or sit up quickly, especially if you  are an older patient. This reduces the risk of dizzy or fainting spells. Alcohol can make you more drowsy and dizzy. Avoid alcoholic drinks. This medicine can affect blood sugar levels. If you have diabetes, check with your doctor or health care professional before you change your diet or the dose of your diabetic medicine. Do not treat yourself for coughs, colds, or pain while you are taking this medicine without asking your doctor or health care professional for advice. Some ingredients may increase your blood pressure. What side effects may I notice from receiving this medicine? Side effects that you should report to your doctor or health care professional as soon as possible: -allergic reactions like skin rash, itching or hives, swelling of the face, lips, or tongue -breathing problems -changes in blood sugar -cold hands or feet -difficulty sleeping, nightmares -dry peeling skin -hallucinations -muscle cramps or weakness -slow heart rate -swelling of the legs and ankles -vomiting Side effects that usually do not require medical attention (report to your doctor or health care professional if they continue or are bothersome): -change in sex drive or performance -diarrhea -dry sore eyes -hair loss -nausea -weak or tired This list may not describe all possible side effects. Call your doctor for medical advice about side effects. You may report side effects to FDA at 1-800-FDA-1088. Where should I keep my medicine? Keep out of the reach of children. Store at room temperature between 15 and 30 degrees C (59 and 86 degrees F). Protect from light. Throw away any unused medicine after the expiration date. NOTE: This sheet is a summary. It may  not cover all possible information. If you have questions about this medicine, talk to your doctor, pharmacist, or health care provider.  2018 [employer]/Gold Standard (2013-04-07 14:51:53) Almotriptan tablets What is this medicine? ALMOTRIPTAN (al moh  TRIP tan) is used to treat migraines with or without aura. An aura is a strange feeling or visual disturbance that warns you of an attack. It is not used to prevent migraines. This medicine may be used for other purposes; ask your health care provider or pharmacist if you have questions. COMMON BRAND NAME(S): Axert What should I tell my health care provider before I take this medicine? They need to know if you have any of these conditions: -bowel disease or colitis -diabetes -family history of heart disease -fast or irregular heart beat -heart or blood vessel disease, angina (chest pain), or previous heart attack -high blood pressure -high cholesterol -history of stroke, transient ischemic attacks (TIAs or mini-strokes), or intracranial bleeding -kidney or liver disease -overweight -poor circulation -postmenopausal or surgical removal of uterus and ovaries -Raynaud's disease -seizure disorder -an unusual or allergic reaction to almotriptan, other medicines, foods, dyes, or preservatives -pregnant or trying to get pregnant -breast-feeding How should I use this medicine? Take this medicine by mouth with a glass of water. Follow the directions on the prescription label. This medicine is taken at the first symptoms of a migraine. It is not for everyday use. If your migraine headache returns after one dose, you can take another dose as directed. You must leave at least 2 hours between doses, and do not take more than 25 mg total in any 24 hour period. If there is no improvement at all after the first dose, do not take a second dose without talking to your doctor or health care professional. Do not take your medicine more often than directed. Talk to your pediatrician regarding the use of this medicine in children. Special care may be needed. Overdosage: If you think you have taken too much of this medicine contact a poison control center or emergency room at once. NOTE: This medicine is only for  you. Do not share this medicine with others. What if I miss a dose? This does not apply; this medicine is not for regular use. What may interact with this medicine? Do not take this medicine with any of the following medicines: -amphetamine or cocaine -dihydroergotamine, ergotamine, ergoloid mesylates, methysergide, or ergot-type medication - do not take within 24 hours of taking almotriptan -feverfew -MAOIs like Carbex, Eldepryl, Marplan, Nardil, and Parnate - do not take almotriptan within 2 weeks of stopping MAOI therapy -other migraine medicines like eletriptan, naratriptan, rizatriptan, zolmitriptan - do not take within 24 hours of taking almotriptan -tryptophan This medicine may also interact with the following medications: -erythromycin -lithium -medicines for fungal infections like ketoconazole and itraconazole -medicines for mental depression, anxiety or mood problems -medicines for weight loss such as dexfenfluramine, dextroamphetamine, fenfluramine, or sibutramine -ritonavir -St. John's wort This list may not describe all possible interactions. Give your health care provider a list of all the medicines, herbs, non-prescription drugs, or dietary supplements you use. Also tell them if you smoke, drink alcohol, or use illegal drugs. Some items may interact with your medicine. What should I watch for while using this medicine? Only take this medicine for a migraine headache. Take it if you get warning symptoms or at the start of a migraine attack. It is not for regular use to prevent migraine attacks. You may get drowsy or dizzy. Do not  drive, use machinery, or do anything that needs mental alertness until you know how this medicine affects you. To reduce dizzy or fainting spells, do not sit or stand up quickly, especially if you are an older patient. Alcohol can increase drowsiness, dizziness and flushing. Avoid alcoholic drinks. Smoking cigarettes may increase the risk of  heart-related side effects from using this medicine. If you take migraine medicines for 10 or more days a month, your migraines may get worse. Keep a diary of headache days and medicine use. Contact your healthcare professional if your migraine attacks occur more frequently. What side effects may I notice from receiving this medicine? Side effects that you should report to your doctor or health care professional as soon as possible: -allergic reactions like skin rash, itching or hives, swelling of the face, lips, or tongue -breathing problems -changes in vision -chest or throat pain, tightness -fast, slow, or irregular heart beat -high or low blood pressure -pain, tingling, numbness in the hands or feet -seizures -severe stomach pain and cramping, bloody diarrhea Side effects that usually do not require medical attention (report to your doctor or health care professional if they continue or are bothersome): -drowsiness -feeling warm, flushing, or redness of the face -headache -muscle pain or cramps -nausea, vomiting, diarrhea or stomach upset -weak or tired This list may not describe all possible side effects. Call your doctor for medical advice about side effects. You may report side effects to FDA at 1-800-FDA-1088. Where should I keep my medicine? Keep out of the reach of children. Store at room temperature between 15 and 30 degrees C (59 and 86 degrees F). Throw away any unused medicine after the expiration date. NOTE: This sheet is a summary. It may not cover all possible information. If you have questions about this medicine, talk to your doctor, pharmacist, or health care provider.  2018 [employer]vier/Gold Standard (2013-03-29 17:00:04)

## 2017-03-12 ENCOUNTER — Encounter: Payer: Self-pay | Admitting: Neurology

## 2017-03-13 DIAGNOSIS — G43709 Chronic migraine without aura, not intractable, without status migrainosus: Secondary | ICD-10-CM | POA: Insufficient documentation

## 2017-04-06 ENCOUNTER — Encounter (HOSPITAL_COMMUNITY): Payer: Self-pay | Admitting: Nurse Practitioner

## 2017-04-06 ENCOUNTER — Emergency Department (HOSPITAL_COMMUNITY)
Admission: EM | Admit: 2017-04-06 | Discharge: 2017-04-07 | Disposition: A | Discharge status: Home / Self Care | Payer: Commercial Managed Care - PPO | Attending: Emergency Medicine | Admitting: Emergency Medicine

## 2017-04-06 DIAGNOSIS — Z79899 Other long term (current) drug therapy: Secondary | ICD-10-CM | POA: Insufficient documentation

## 2017-04-06 DIAGNOSIS — R102 Pelvic and perineal pain: Secondary | ICD-10-CM | POA: Diagnosis present

## 2017-04-06 HISTORY — DX: Unspecified ovarian cyst, unspecified side: N83.209

## 2017-04-06 LAB — CBC
HCT: 42.1 % (ref 36.0–46.0)
Hemoglobin: 14.1 g/dL (ref 12.0–15.0)
MCH: 31 pg (ref 26.0–34.0)
MCHC: 33.5 g/dL (ref 30.0–36.0)
MCV: 92.5 fL (ref 78.0–100.0)
PLATELETS: 289 10*3/uL (ref 150–400)
RBC: 4.55 MIL/uL (ref 3.87–5.11)
RDW: 12.2 % (ref 11.5–15.5)
WBC: 11.9 10*3/uL — ABNORMAL HIGH (ref 4.0–10.5)

## 2017-04-06 LAB — COMPREHENSIVE METABOLIC PANEL
ALK PHOS: 58 U/L (ref 38–126)
ALT: 15 U/L (ref 14–54)
AST: 18 U/L (ref 15–41)
Albumin: 4.4 g/dL (ref 3.5–5.0)
Anion gap: 11 (ref 5–15)
BUN: 11 mg/dL (ref 6–20)
CALCIUM: 9.6 mg/dL (ref 8.9–10.3)
CO2: 22 mmol/L (ref 22–32)
CREATININE: 0.82 mg/dL (ref 0.44–1.00)
Chloride: 104 mmol/L (ref 101–111)
GFR calc non Af Amer: >60 mL/min (ref >60)
Glucose, Bld: 81 mg/dL (ref 65–99)
Potassium: 3.9 mmol/L (ref 3.5–5.1)
SODIUM: 137 mmol/L (ref 135–145)
Total Bilirubin: 0.7 mg/dL (ref 0.3–1.2)
Total Protein: 7.8 g/dL (ref 6.5–8.1)

## 2017-04-06 LAB — I-STAT BETA HCG BLOOD, ED (MC, WL, AP ONLY)

## 2017-04-06 LAB — URINALYSIS, ROUTINE W REFLEX MICROSCOPIC
BILIRUBIN URINE: NEGATIVE
Glucose, UA: NEGATIVE mg/dL
HGB URINE DIPSTICK: NEGATIVE
KETONES UR: NEGATIVE mg/dL
Leukocytes, UA: NEGATIVE
Nitrite: NEGATIVE
PROTEIN: NEGATIVE mg/dL
Specific Gravity, Urine: 1.014 (ref 1.005–1.030)
pH: 5 (ref 5.0–8.0)

## 2017-04-06 MED ORDER — OXYCODONE-ACETAMINOPHEN 5-325 MG PO TABS
1.0000 | ORAL_TABLET | ORAL | Status: DC | PRN
  Administered 2017-04-06: 1 via ORAL

## 2017-04-06 MED ORDER — OXYCODONE-ACETAMINOPHEN 5-325 MG PO TABS
ORAL_TABLET | ORAL | Status: AC
  Filled 2017-04-06: qty 1

## 2017-04-06 MED ORDER — ONDANSETRON 4 MG PO TBDP
ORAL_TABLET | ORAL | Status: AC
  Administered 2017-04-06: 4 mg
  Filled 2017-04-06: qty 1

## 2017-04-06 MED ORDER — SODIUM CHLORIDE 0.9 % IV BOLUS (SEPSIS)
1000.0000 mL | Freq: Once | INTRAVENOUS | Status: AC
  Administered 2017-04-06: 1000 mL via INTRAVENOUS

## 2017-04-06 MED ORDER — IOPAMIDOL (ISOVUE-300) INJECTION 61%
INTRAVENOUS | Status: AC
  Administered 2017-04-07: 100 mL via INTRAVENOUS
  Filled 2017-04-06: qty 100

## 2017-04-06 MED ORDER — ONDANSETRON HCL 4 MG/2ML IJ SOLN
4.0000 mg | Freq: Once | INTRAMUSCULAR | Status: AC
  Administered 2017-04-06: 4 mg via INTRAVENOUS
  Filled 2017-04-06: qty 2

## 2017-04-06 MED ORDER — HYDROMORPHONE HCL 1 MG/ML IJ SOLN
1.0000 mg | Freq: Once | INTRAMUSCULAR | Status: AC
  Administered 2017-04-06: 1 mg via INTRAVENOUS
  Filled 2017-04-06: qty 1

## 2017-04-06 NOTE — ED Triage Notes (Signed)
Pt presents with with c/o pelvic pain. The pain began about five days ago when she noticed a palpable mass to her pelvic area. The pain increased in severity today and is now on both sides of her pelvis. She reports fevers, nausea, vomiting, urinary hesitancy, diarrhea. She denies vaginal discharge, bleeding, constipation. She has tried tramadol, tylenol, percocet at home with temporary improvement. She has a history of endometriosis and ovarian cysts. She scheduled an appointment with her OBGYN next week but the pain is too severe to wait.

## 2017-04-06 NOTE — ED Notes (Signed)
Pt pulled back to triage to give pain medicine.

## 2017-04-07 ENCOUNTER — Emergency Department (HOSPITAL_COMMUNITY): Payer: Commercial Managed Care - PPO

## 2017-04-07 LAB — WET PREP, GENITAL
Clue Cells Wet Prep HPF POC: NONE SEEN
SPERM: NONE SEEN
TRICH WET PREP: NONE SEEN
YEAST WET PREP: NONE SEEN

## 2017-04-07 LAB — CBG MONITORING, ED: GLUCOSE-CAPILLARY: 126 mg/dL — AB (ref 65–99)

## 2017-04-07 LAB — GC/CHLAMYDIA PROBE AMP (~~LOC~~) NOT AT ARMC
Chlamydia: NEGATIVE
Neisseria Gonorrhea: NEGATIVE

## 2017-04-07 MED ORDER — KETOROLAC TROMETHAMINE 30 MG/ML IJ SOLN
15.0000 mg | Freq: Once | INTRAMUSCULAR | Status: AC
  Administered 2017-04-07: 15 mg via INTRAVENOUS

## 2017-04-07 MED ORDER — PROMETHAZINE HCL 25 MG PO TABS: 25.0000 mg | ORAL_TABLET | Freq: Four times a day (QID) | ORAL | 0 refills | Status: DC | PRN

## 2017-04-07 MED ORDER — LORAZEPAM 2 MG/ML IJ SOLN
1.0000 mg | Freq: Once | INTRAMUSCULAR | Status: AC
  Administered 2017-04-07: 1 mg via INTRAVENOUS

## 2017-04-07 MED ORDER — KETOROLAC TROMETHAMINE 15 MG/ML IJ SOLN
INTRAMUSCULAR | Status: AC
  Filled 2017-04-07: qty 1

## 2017-04-07 MED ORDER — METOCLOPRAMIDE HCL 5 MG/ML IJ SOLN
10.0000 mg | Freq: Once | INTRAMUSCULAR | Status: AC
  Administered 2017-04-07: 10 mg via INTRAVENOUS
  Filled 2017-04-07: qty 2

## 2017-04-07 MED ORDER — HYDROMORPHONE HCL 1 MG/ML IJ SOLN
1.0000 mg | Freq: Once | INTRAMUSCULAR | Status: AC
  Administered 2017-04-07: 1 mg via INTRAVENOUS

## 2017-04-07 MED ORDER — ONDANSETRON HCL 4 MG PO TABS: 4.0000 mg | ORAL_TABLET | Freq: Four times a day (QID) | ORAL | 0 refills | Status: DC

## 2017-04-07 MED ORDER — HYDROMORPHONE HCL 1 MG/ML IJ SOLN
INTRAMUSCULAR | Status: AC
  Filled 2017-04-07: qty 1

## 2017-04-07 MED ORDER — OXYCODONE-ACETAMINOPHEN 5-325 MG PO TABS: 1.0000 | ORAL_TABLET | ORAL | 0 refills | Status: DC | PRN

## 2017-04-07 MED ORDER — LORAZEPAM 2 MG/ML IJ SOLN
INTRAMUSCULAR | Status: AC
  Filled 2017-04-07: qty 1

## 2017-04-07 NOTE — ED Provider Notes (Signed)
[facility]-EMERGENCY DEPT Provider Note   CSN: [id number] Arrival date & time: 04/06/17  1723     History   Chief Complaint Chief Complaint  Patient presents with  . Pelvic Pain    HPI Destiny Ellis is a 26 y.o. female.  Patient presents to the ER for evaluation of abdominal and pelvic pain.she reports that she noticed some slight pain, tenderness and a small lump in the right groin nearly 2 weeks ago. Over the last 5 days or so the pain has progressively worsened. She reports that the pain radiates upwards to the abdomen area and down the leg. Pain worsens with touching the area and moving it. Patient reports a history of ovarian cyst and endometriosis. The pain that she is experiencing today, however, is not similar to either of these conditions. She has not had any nausea, vomiting, diarrhea, fever, urinary symptoms. No unusual vaginal bleeding. She has not had relief with tramadol, Tylenol, Percocet at home.      Past Medical History:  Diagnosis Date  . Endometriosis   . Headache   . Migraines   . Ovarian cyst   . Vision abnormalities     Patient Active Problem List   Diagnosis Date Noted  . Chronic migraine without aura without status migrainosus, not intractable 03/13/2017    Past Surgical History:  Procedure Laterality Date  . LAPAROSCOPY ABDOMEN DIAGNOSTIC    . laporoscopy    . TONSILLECTOMY      OB History    No data available       Home Medications    Prior to Admission medications   Medication Sig Start Date End Date Taking? Authorizing Provider  almotriptan (AXERT) 6.25 MG tablet Take 1 tablet (6.25 mg total) by mouth as needed for migraine. may repeat in 2 hours if needed 03/11/17   Anson Fret, MD  buPROPion [facility] SR) 150 MG 12 hr tablet Take 150 mg by mouth daily.    [provider]  cetirizine (ZYRTEC) 10 MG tablet Take 10 mg by mouth daily.    [provider]  clonazePAM (KLONOPIN) 0.5 MG tablet Take 0.5 mg by mouth  daily.    [provider]  Ketorolac Tromethamine (TORADOL ORAL PO) Take 30 mg by mouth 3 times/day as needed-between meals & bedtime.    [provider]  meclizine (ANTIVERT) 25 MG tablet Take 1 tablet (25 mg total) by mouth 3 (three) times daily as needed for dizziness. 03/03/17   Arthor Captain, PA-C  meloxicam (MOBIC) 15 MG tablet Take 1 tablet (15 mg total) by mouth daily. Take 1 daily with food. 03/03/17   Harris, Abigail, PA-C  montelukast (SINGULAIR) 10 MG tablet Take 10 mg by mouth at bedtime.    [provider]  ondansetron (ZOFRAN) 8 MG tablet Take 8 mg by mouth every 8 (eight) hours as needed for nausea or vomiting.    [provider]  propranolol (INDERAL) 10 MG tablet Take 1 tablet (10 mg total) by mouth 2 (two) times daily. 03/11/17   Anson Fret, MD  rizatriptan (MAXALT-MLT) 10 MG disintegrating tablet Take 10 mg by mouth as needed for migraine. May repeat in 2 hours if needed    [provider]  sertraline (ZOLOFT) 50 MG tablet Take 50 mg by mouth daily.    [provider]  spironolactone (ALDACTONE) 25 MG tablet Take 25 mg by mouth daily.    [provider]  traMADol (ULTRAM) 50 MG tablet Take by mouth every  6 (six) hours as needed.    [provider]    Family History Family History  Problem Relation Age of Onset  . Hyperthyroidism Mother   . Hypertension Father   . Bipolar disorder Father   . Migraines Father   . Healthy Brother     Social History Social History  Substance Use Topics  . Smoking status: Never Smoker  . Smokeless tobacco: Never Used  . Alcohol use Yes     Comment: occasional     Allergies   Sulfa antibiotics and Sulfa antibiotics   Review of Systems Review of Systems  Gastrointestinal: Positive for abdominal pain.  Genitourinary: Positive for pelvic pain.  All other systems reviewed and are negative.    Physical Exam Updated Vital Signs BP 124/73 (BP Location:  Left Arm)   Pulse 76   Temp 98.2 F (36.8 C) (Oral)   Resp 18   SpO2 99%   Physical Exam  Constitutional: She is oriented to person, place, and time. She appears well-developed and well-nourished. No distress.  HENT:  Head: Normocephalic and atraumatic.  Right Ear: Hearing normal.  Left Ear: Hearing normal.  Nose: Nose normal.  Mouth/Throat: Oropharynx is clear and moist and mucous membranes are normal.  Eyes: Pupils are equal, round, and reactive to light. Conjunctivae and EOM are normal.  Neck: Normal range of motion. Neck supple.  Cardiovascular: Regular rhythm, S1 normal and S2 normal.  Exam reveals no gallop and no friction rub.   No murmur heard. Pulmonary/Chest: Effort normal and breath sounds normal. No respiratory distress. She exhibits no tenderness.  Abdominal: Soft. Normal appearance and bowel sounds are normal. There is no hepatosplenomegaly. There is generalized tenderness. There is no rebound, no guarding, no tenderness at McBurney's point and negative Murphy's sign. No hernia. Hernia confirmed negative in the right inguinal area and confirmed negative in the left inguinal area.  Some fullness noted at the right inguinal ligament region but no discrete mass, no fluctuance, no lymphadenopathy  Genitourinary: Uterus normal. There is no rash or tenderness on the right labia. There is no rash or tenderness on the left labia. Cervix exhibits friability. Cervix exhibits no motion tenderness. Right adnexum displays no mass and no tenderness. Left adnexum displays tenderness and fullness. There is tenderness in the vagina. No erythema or bleeding in the vagina. No foreign body in the vagina. No signs of injury around the vagina. No vaginal discharge found.  Musculoskeletal: Normal range of motion.  Neurological: She is alert and oriented to person, place, and time. She has normal strength. No cranial nerve deficit or sensory deficit. Coordination normal. GCS eye subscore is 4. GCS  verbal subscore is 5. GCS motor subscore is 6.  Skin: Skin is warm, dry and intact. No rash noted. No cyanosis.  Psychiatric: She has a normal mood and affect. Her speech is normal and behavior is normal. Thought content normal.  Nursing note and vitals reviewed.    ED Treatments / Results  Labs (all labs ordered are listed, but only abnormal results are displayed) Labs Reviewed  WET PREP, GENITAL - Abnormal; Notable for the following:       Result Value   WBC, Wet Prep HPF POC FEW (*)    All other components within normal limits  CBC - Abnormal; Notable for the following:    WBC 11.9 (*)    All other components within normal limits  COMPREHENSIVE METABOLIC PANEL  URINALYSIS, ROUTINE W REFLEX MICROSCOPIC  I-STAT BETA HCG BLOOD,  ED ([facility], [facility], [facility] ONLY)  GC/CHLAMYDIA PROBE AMP ([place]) NOT AT [facility]    EKG  EKG Interpretation None       Radiology US Transvaginal Non-ob  Result Date: 04/07/2017 CLINICAL DATA:  Pelvic pain.  Abnormal CT. EXAM: TRANSABDOMINAL AND TRANSVAGINAL ULTRASOUND OF PELVIS DOPPLER ULTRASOUND OF OVARIES TECHNIQUE: Both transabdominal and transvaginal ultrasound examinations of the pelvis were performed. Transabdominal technique was performed for global imaging of the pelvis including uterus, ovaries, adnexal regions, and pelvic cul-de-sac. It was necessary to proceed with endovaginal exam following the transabdominal exam to visualize the uterus, ovaries and adnexa. Color and duplex Doppler ultrasound was utilized to evaluate blood flow to the ovaries. COMPARISON:  Abdominal CT earlier this day. FINDINGS: Uterus Measurements: 8.0 x 2.6 x 2.9 cm. No evidence of pedunculated fibroid correspond to abnormality on CT. Endometrium Thickness: 7 mm.  No focal abnormality visualized. Right ovary Measurements: 2.2 x 1.0 x 1.4 cm. Normal appearance with physiologic follicles. Blood flow seen. No adnexal mass. Left ovary Measurements: 2.0 x 1.4 x 1.3 cm. Normal appearance,  blood flow seen. No adnexal mass. Pulsed Doppler evaluation of both ovaries demonstrates normal low-resistance arterial and venous waveforms. Other findings No abnormal free fluid. IMPRESSION: 1. Left adnexal structure on CT cannot be localized sonographically. No sonographic evidence of pedunculated fibroid or adnexal mass. Given history of endometriosis, pelvic MRI could be considered to evaluate for sonographically occult endometrioma. 2. Normal blood flow to both ovaries without torsion. Electronically Signed   By: Rubye Oaks M.D.   On: 04/07/2017 03:37   US Pelvis Complete  Result Date: 04/07/2017 CLINICAL DATA:  Pelvic pain.  Abnormal CT. EXAM: TRANSABDOMINAL AND TRANSVAGINAL ULTRASOUND OF PELVIS DOPPLER ULTRASOUND OF OVARIES TECHNIQUE: Both transabdominal and transvaginal ultrasound examinations of the pelvis were performed. Transabdominal technique was performed for global imaging of the pelvis including uterus, ovaries, adnexal regions, and pelvic cul-de-sac. It was necessary to proceed with endovaginal exam following the transabdominal exam to visualize the uterus, ovaries and adnexa. Color and duplex Doppler ultrasound was utilized to evaluate blood flow to the ovaries. COMPARISON:  Abdominal CT earlier this day. FINDINGS: Uterus Measurements: 8.0 x 2.6 x 2.9 cm. No evidence of pedunculated fibroid correspond to abnormality on CT. Endometrium Thickness: 7 mm.  No focal abnormality visualized. Right ovary Measurements: 2.2 x 1.0 x 1.4 cm. Normal appearance with physiologic follicles. Blood flow seen. No adnexal mass. Left ovary Measurements: 2.0 x 1.4 x 1.3 cm. Normal appearance, blood flow seen. No adnexal mass. Pulsed Doppler evaluation of both ovaries demonstrates normal low-resistance arterial and venous waveforms. Other findings No abnormal free fluid. IMPRESSION: 1. Left adnexal structure on CT cannot be localized sonographically. No sonographic evidence of pedunculated fibroid or adnexal  mass. Given history of endometriosis, pelvic MRI could be considered to evaluate for sonographically occult endometrioma. 2. Normal blood flow to both ovaries without torsion. Electronically Signed   By: Rubye Oaks M.D.   On: 04/07/2017 03:37   Ct Abdomen Pelvis W Contrast  Result Date: 04/07/2017 CLINICAL DATA:  Suprapubic pain. History of endometriosis. Mild diarrhea. EXAM: CT ABDOMEN AND PELVIS WITH CONTRAST TECHNIQUE: Multidetector CT imaging of the abdomen and pelvis was performed using the standard protocol following bolus administration of intravenous contrast. CONTRAST:  ISOVUE-300 IOPAMIDOL (ISOVUE-300) INJECTION 61% COMPARISON:  None. FINDINGS: Lower chest: No pulmonary nodules or pleural effusion. No visible pericardial effusion. Hepatobiliary: Normal hepatic contours and density. No visible biliary dilatation. Normal gallbladder. Pancreas: Normal contours without ductal dilatation.  No peripancreatic fluid collection. Spleen: Normal. Adrenals/Urinary Tract: --Adrenal glands: Normal. --Right kidney/ureter: No hydronephrosis or perinephric stranding. No nephrolithiasis. No obstructing ureteral stones. --Left kidney/ureter: No hydronephrosis or perinephric stranding. No nephrolithiasis. No obstructing ureteral stones. --Urinary bladder: Unremarkable. Stomach/Bowel: --Stomach/Duodenum: No hiatal hernia or other gastric abnormality. Normal duodenal course and caliber. --Small bowel: No dilatation or inflammation. --Colon: No focal abnormality. --Appendix: Normal. Vascular/Lymphatic: Normal course and caliber of the major abdominal vessels. No abdominal or pelvic lymphadenopathy. Reproductive: 3.2 cm mass at the left aspect of the uterine fundus. No adnexal mass. No free fluid in the pelvis. Musculoskeletal. No bony spinal canal stenosis or focal osseous abnormality. Other: None. IMPRESSION: 1. Masslike soft tissue a arising from the left uterine fundus, possibly indicating a pedunculated  fibroid or endometrioma. Pelvic ultrasound may be helpful further characterization. 2. Otherwise, no acute abnormality of the abdomen or pelvis. Electronically Signed   By: Deatra Robinson M.D.   On: 04/07/2017 01:08   [place] Art/ven Flow Abd Pelv Doppler  Result Date: 04/07/2017 CLINICAL DATA:  Pelvic pain.  Abnormal CT. EXAM: TRANSABDOMINAL AND TRANSVAGINAL ULTRASOUND OF PELVIS DOPPLER ULTRASOUND OF OVARIES TECHNIQUE: Both transabdominal and transvaginal ultrasound examinations of the pelvis were performed. Transabdominal technique was performed for global imaging of the pelvis including uterus, ovaries, adnexal regions, and pelvic cul-de-sac. It was necessary to proceed with endovaginal exam following the transabdominal exam to visualize the uterus, ovaries and adnexa. Color and duplex Doppler ultrasound was utilized to evaluate blood flow to the ovaries. COMPARISON:  Abdominal CT earlier this day. FINDINGS: Uterus Measurements: 8.0 x 2.6 x 2.9 cm. No evidence of pedunculated fibroid correspond to abnormality on CT. Endometrium Thickness: 7 mm.  No focal abnormality visualized. Right ovary Measurements: 2.2 x 1.0 x 1.4 cm. Normal appearance with physiologic follicles. Blood flow seen. No adnexal mass. Left ovary Measurements: 2.0 x 1.4 x 1.3 cm. Normal appearance, blood flow seen. No adnexal mass. Pulsed Doppler evaluation of both ovaries demonstrates normal low-resistance arterial and venous waveforms. Other findings No abnormal free fluid. IMPRESSION: 1. Left adnexal structure on CT cannot be localized sonographically. No sonographic evidence of pedunculated fibroid or adnexal mass. Given history of endometriosis, pelvic MRI could be considered to evaluate for sonographically occult endometrioma. 2. Normal blood flow to both ovaries without torsion. Electronically Signed   By: Rubye Oaks M.D.   On: 04/07/2017 03:37    Procedures Procedures (including critical care time)  Medications Ordered in  ED Medications  oxyCODONE-acetaminophen (PERCOCET/ROXICET) 5-325 MG per tablet 1 tablet (1 tablet Oral Given 04/06/17 2115)  ketorolac (TORADOL) 15 MG/ML injection (not administered)  HYDROmorphone (DILAUDID) 1 MG/ML injection (not administered)  LORazepam (ATIVAN) 2 MG/ML injection (not administered)  ondansetron (ZOFRAN-ODT) 4 MG disintegrating tablet (4 mg  Given 04/06/17 2116)  sodium chloride 0.9 % bolus 1,000 mL (1,000 mLs Intravenous New Bag/Given 04/06/17 2359)  HYDROmorphone (DILAUDID) injection 1 mg (1 mg Intravenous Given 04/06/17 2359)  ondansetron (ZOFRAN) injection 4 mg (4 mg Intravenous Given 04/06/17 2359)  iopamidol (ISOVUE-300) 61 % injection (100 mLs Intravenous Contrast Given 04/07/17 0039)  ketorolac (TORADOL) 30 MG/ML injection 15 mg (15 mg Intravenous Given 04/07/17 0138)  HYDROmorphone (DILAUDID) injection 1 mg (1 mg Intravenous Given 04/07/17 0138)  LORazepam (ATIVAN) injection 1 mg (1 mg Intravenous Given 04/07/17 0236)  metoCLOPramide (REGLAN) injection 10 mg (10 mg Intravenous Given 04/07/17 0342)     Initial Impression / Assessment and Plan / ED Course  I have reviewed the triage vital signs and  the nursing notes.  Pertinent labs & imaging results that were available during my care of the patient were reviewed by me and considered in my medical decision making (see chart for details).     Patient presents to the ER with complaints of pelvic pain. She has a history of ovarian cyst as well as endometriosis. Patient reports that approximately 2 weeks ago she started to notice some slight pain in her right groin. She noticed a fullness in this region. Patient indicates a lump at the inguinal ligament region. There is a fullness noted there that is slightly tender but no discrete mass. She did have pain up into the abdomen as well. Patient underwent CT scan to further evaluate, including rule out appendicitis as well as femoral hernia. CT scan showed nothing acute other than  abnormality in the area of the left adnexa, recommended ultrasound. Ultrasound including Doppler was performed. Both adnexa looked normal, no masses noted. No evidence of torsion. Pain likely secondary to her endometriosis. At this point she has had a thorough workup that does not show anything that would require emergent or urgent surgery. She has follow-up scheduled with her OB/GYN. Continue to treat with analgesia.  Final Clinical Impressions(s) / ED Diagnoses   Final diagnoses:  Pelvic pain    New Prescriptions New Prescriptions   No medications on file     Gilda Crease, MD 04/07/17 [phone]

## 2017-04-07 NOTE — ED Notes (Signed)
Continuing to c/o nausea, MD notified. See new orders.

## 2017-04-07 NOTE — ED Notes (Signed)
To CT at this time.

## 2017-04-07 NOTE — ED Notes (Signed)
Pt reports she continues to be nauseas

## 2017-04-07 NOTE — ED Notes (Signed)
Pt quite pale upon assessment. CBG to be checked. VSS, alert x4.

## 2017-04-07 NOTE — ED Notes (Signed)
To ultrasound at this time

## 2017-05-03 ENCOUNTER — Encounter: Payer: Self-pay | Admitting: Neurology

## 2017-05-03 ENCOUNTER — Telehealth: Payer: Self-pay | Admitting: Neurology

## 2017-05-03 NOTE — Telephone Encounter (Signed)
Patient would like to start botox for migraine, can you start the process and reach out to her please? Thank you!

## 2017-05-05 NOTE — Telephone Encounter (Signed)
I called to schedule patient for injections but she did not answer, her mailbox was full and I was unable to leave a message.

## 2017-05-18 ENCOUNTER — Telehealth: Payer: Self-pay | Admitting: Neurology

## 2017-05-18 NOTE — Telephone Encounter (Signed)
Pt is asking for a call back re: the Botox appointment and information she was given from her insurance company.  Please call

## 2017-05-18 NOTE — Telephone Encounter (Signed)
Patient called back again, I spoke with her and she said that the pharmacy had told her she was not covered for the botox, it needed a PA and she would have to wait 3 days for it to be processed. I told her that I spoke with her pharmacy this morning and they did not tell me any of those things. The patient was upset and stated that she was told she covered completely for the drug and that's the only reason she proceeded with the injections. I informed her that we check to make sure she is covered through her insurance, there are no pa requirements, and then once the apt is scheduled we call it in to the pharmacy. I told her I would call and check with the pharmacy to see what the hold up is. She wants me to call her back this afternoon to let her know what is going on. I told her that I would.

## 2017-05-20 ENCOUNTER — Ambulatory Visit (INDEPENDENT_AMBULATORY_CARE_PROVIDER_SITE_OTHER): Payer: Commercial Managed Care - PPO | Admitting: Neurology

## 2017-05-20 VITALS — BP 116/74 | HR 75

## 2017-05-20 DIAGNOSIS — G43709 Chronic migraine without aura, not intractable, without status migrainosus: Secondary | ICD-10-CM

## 2017-05-20 MED ORDER — ALMOTRIPTAN MALATE 12.5 MG PO TABS: 12.5000 mg | ORAL_TABLET | ORAL | 11 refills | Status: DC | PRN

## 2017-05-20 NOTE — Progress Notes (Signed)
Botox 100 units.2 vials.[id number] expires 11/2019. [phone].

## 2017-05-20 NOTE — Patient Instructions (Addendum)
Join the facebook group [facility]  Take a look at [employer] for Cefaly device  Look up Aimovig or Ajovy

## 2017-05-24 NOTE — Progress Notes (Signed)
Discussed:   Join the facebook group [facility]  Take a look at [employer] for Cefaly device  Look up Aimovig or Ajovy Consent Form Botulism Toxin Injection For Chronic Migraine  Botulism toxin has been approved by the Federal drug administration for treatment of chronic migraine. Botulism toxin does not cure chronic migraine and it may not be effective in some patients.  The administration of botulism toxin is accomplished by injecting a small amount of toxin into the muscles of the neck and head. Dosage must be titrated for each individual. Any benefits resulting from botulism toxin tend to wear off after 3 months with a repeat injection required if benefit is to be maintained. Injections are usually done every 3-4 months with maximum effect peak achieved by about 2 or 3 weeks. Botulism toxin is expensive and you should be sure of what costs you will incur resulting from the injection.  The side effects of botulism toxin use for chronic migraine may include:   -Transient, and usually mild, facial weakness with facial injections  -Transient, and usually mild, head or neck weakness with head/neck injections  -Reduction or loss of forehead facial animation due to forehead muscle              weakness  -Eyelid drooping  -Dry eye  -Pain at the site of injection or bruising at the site of injection  -Double vision  -Potential unknown long term risks  Contraindications: You should not have Botox if you are pregnant, nursing, allergic to albumin, have an infection, skin condition, or muscle weakness at the site of the injection, or have myasthenia gravis, Lambert-Eaton syndrome, or ALS.  It is also possible that as with any injection, there may be an allergic reaction or no effect from the medication. Reduced effectiveness after repeated injections is sometimes seen and rarely infection at the injection site may occur. All care will be taken to prevent these side effects. If  therapy is given over a long time, atrophy and wasting in the muscle injected may occur. Occasionally the patient's become refractory to treatment because they develop antibodies to the toxin. In this event, therapy needs to be modified.  I have read the above information and consent to the administration of botulism toxin.    ______________  _____   _________________  Patient signature     Date   Witness signature       BOTOX PROCEDURE NOTE FOR MIGRAINE HEADACHE    Contraindications and precautions discussed with patient(above). Aseptic procedure was observed and patient tolerated procedure. Procedure performed by Dr. Artemio Aly  The condition has existed for more than 6 months, and pt does not have a diagnosis of ALS, Myasthenia Gravis or Lambert-Eaton Syndrome. Risks and benefits of injections discussed and pt agrees to proceed with the procedure. Written consent obtained  These injections are medically necessary. He receives good benefits from these injections. These injections do not cause sedations or hallucinations which the oral therapies may cause.  Indication/Diagnosis: chronic migraine BOTOX(J0585) injection was performed according to protocol by [employer]. 200 units of BOTOX was dissolved into 4 cc NS.  NDC: [address]-40   Description of procedure:  The patient was placed in a sitting position. The standard protocol was used for Botox as follows, with 5 units of Botox injected at each site:   -Procerus muscle, midline injection  -Corrugator muscle, bilateral injection  -Frontalis muscle, bilateral injection, with 2 sites each side, medial injection was performed in the upper  one third of the frontalis muscle, in the region vertical from the medial inferior edge of the superior orbital rim. The lateral injection was again in the upper one third of the forehead vertically above the lateral limbus of the cornea, 1.5 cm lateral to the medial injection  site.  -Temporalis muscle injection, 4 sites, bilaterally. The first injection was 3 cm above the tragus of the ear, second injection site was 1.5 cm to 3 cm up from the first injection site in line with the tragus of the ear. The third injection site was 1.5-3 cm forward between the first 2 injection sites. The fourth injection site was 1.5 cm posterior to the second injection site.  -Occipitalis muscle injection, 3 sites, bilaterally. The first injection was done one half way between the occipital protuberance and the tip of the mastoid process behind the ear. The second injection site was done lateral and superior to the first, 1 fingerbreadth from the first injection. The third injection site was 1 fingerbreadth superiorly and medially from the first injection site.  -Cervical paraspinal muscle injection, 2 sites, bilateral knee first injection site was 1 cm from the midline of the cervical spine, 3 cm inferior to the lower border of the occipital protuberance. The second injection site was 1.5 cm superiorly and laterally to the first injection site.  -Trapezius muscle injection was performed at 3 sites, bilaterally. The first injection site was in the upper trapezius muscle halfway between the inflection point of the neck, and the acromion. The second injection site was one half way between the acromion and the first injection site. The third injection was done between the first injection site and the inflection point of the neck.   Will return for repeat injection in 3 months.   A 200 unit sof Botox was used, 155 units were injected, the rest of the Botox was wasted. The patient tolerated the procedure well, there were no complications of the above procedure.

## 2017-06-03 ENCOUNTER — Other Ambulatory Visit: Payer: Self-pay | Admitting: Neurology

## 2017-06-03 ENCOUNTER — Encounter: Payer: Self-pay | Admitting: Neurology

## 2017-06-07 ENCOUNTER — Ambulatory Visit: Payer: Commercial Managed Care - PPO | Admitting: Neurology

## 2017-08-17 DIAGNOSIS — J069 Acute upper respiratory infection, unspecified: Secondary | ICD-10-CM

## 2017-08-17 HISTORY — DX: Acute upper respiratory infection, unspecified: J06.9

## 2017-08-18 ENCOUNTER — Telehealth: Payer: Self-pay | Admitting: Neurology

## 2017-08-18 NOTE — Telephone Encounter (Signed)
I called the pharmacy to schedule a refill for the patients medication. They stated that they had tried to reach the patient several times and had reached their maximum attempts they are allowed to make. I began the request and called the patient. I asked her if she would give the pharmacy a call and she took their number and agreed to do so.

## 2017-08-25 ENCOUNTER — Telehealth: Payer: Self-pay | Admitting: Neurology

## 2017-08-25 ENCOUNTER — Ambulatory Visit: Payer: Commercial Managed Care - PPO | Admitting: Neurology

## 2017-08-25 ENCOUNTER — Encounter: Payer: Self-pay | Admitting: Neurology

## 2017-08-25 VITALS — BP 118/70 | HR 80

## 2017-08-25 DIAGNOSIS — G43709 Chronic migraine without aura, not intractable, without status migrainosus: Secondary | ICD-10-CM

## 2017-08-25 MED ORDER — FREMANEZUMAB-VFRM 225 MG/1.5ML ~~LOC~~ SOSY: 1.0000 | PREFILLED_SYRINGE | SUBCUTANEOUS | 0 refills | Status: DC

## 2017-08-25 MED ORDER — JUNEL 1/20 1-20 MG-MCG PO TABS: 1.0000 | ORAL_TABLET | Freq: Every day | ORAL | 11 refills | Status: DC

## 2017-08-25 NOTE — Telephone Encounter (Signed)
[facility]ed [facility] to [facility] fax [phone].

## 2017-08-25 NOTE — Progress Notes (Signed)
Botox- 100 units x 2 vials Lot: [id number] Expiration: 12/2019 NDC: [id number]  Bacteriostatic 0.9% Sodium Chloride- 4mL total Lot: [id number] Expiration: 12/16/2018 NDC: [id number]  Dx: [id number] S/P //BCrn

## 2017-08-25 NOTE — Progress Notes (Signed)

## 2017-08-26 ENCOUNTER — Encounter: Payer: Self-pay | Admitting: Neurology

## 2017-08-26 NOTE — Telephone Encounter (Signed)
Trudy called from [facility], she needs insurance info faxed, she did not rec that on this pt  Thank you

## 2017-08-27 ENCOUNTER — Other Ambulatory Visit: Payer: Self-pay | Admitting: Neurology

## 2017-08-27 DIAGNOSIS — G4484 Primary exertional headache: Secondary | ICD-10-CM

## 2017-08-27 MED ORDER — INDOMETHACIN 50 MG PO CAPS: ORAL_CAPSULE | ORAL | 11 refills | Status: DC

## 2017-08-27 NOTE — Telephone Encounter (Signed)
Noted and faxed.  

## 2017-09-02 ENCOUNTER — Ambulatory Visit: Payer: Commercial Managed Care - PPO | Admitting: Neurology

## 2017-09-16 ENCOUNTER — Telehealth: Payer: Self-pay | Admitting: Neurology

## 2017-09-16 NOTE — Telephone Encounter (Signed)
Iya @ [facility] has called re: Botox for pt, she is asking for a call back at [phone]

## 2017-09-20 NOTE — Telephone Encounter (Signed)
I called and spoke with a representative to put the rx on hold until her apt in April.

## 2017-09-29 ENCOUNTER — Other Ambulatory Visit: Payer: Self-pay | Admitting: Neurology

## 2017-11-17 ENCOUNTER — Telehealth: Payer: Self-pay | Admitting: Neurology

## 2017-11-17 NOTE — Telephone Encounter (Signed)
Patients medication is scheduled TBD on 11/18/17 from [facility].

## 2017-11-17 NOTE — Telephone Encounter (Signed)
Botox authorization approved through [facility] [id number] (11/18/18).

## 2017-11-24 ENCOUNTER — Ambulatory Visit: Payer: Commercial Managed Care - PPO | Admitting: Neurology

## 2017-11-24 ENCOUNTER — Encounter: Payer: Self-pay | Admitting: Neurology

## 2017-11-24 VITALS — BP 124/87 | HR 80

## 2017-11-24 DIAGNOSIS — G43709 Chronic migraine without aura, not intractable, without status migrainosus: Secondary | ICD-10-CM

## 2017-11-24 MED ORDER — TIZANIDINE HCL 4 MG PO TABS: 4.0000 mg | ORAL_TABLET | Freq: Four times a day (QID) | ORAL | 11 refills | Status: DC | PRN

## 2017-11-24 MED ORDER — FREMANEZUMAB-VFRM 225 MG/1.5ML ~~LOC~~ SOSY: 225.0000 mg | PREFILLED_SYRINGE | SUBCUTANEOUS | 11 refills | Status: DC

## 2017-11-24 NOTE — Progress Notes (Signed)
Botox- 100 units x 2 vials Lot: [id number] Expiration: 04/2020 NDC: [id number]  Bacteriostatic 0.9% Sodium Chloride- 4mL total Lot: [id number] Expiration: 12/16/2018 NDC: [id number]  Dx: [id number] S/P  //BCrn

## 2017-11-24 NOTE — Addendum Note (Signed)
Addended by: Naomie DeanAHERN, Jacalyn Biggs B on: 11/24/2017 01:43 PM   Modules accepted: Level of Service

## 2017-11-24 NOTE — Progress Notes (Signed)
Interval History 11/24/2017: Patient's baseline is 20 headache days a month and 12 migraine days a month. This is third botox. She is on axert, elavil, cymbalta and Ajovy, indomethacin prn for exertional headache, zofran for nausea. Tried topiramate. She has >50% improvement with migraine frequency and severity.   Consent Form Botulism Toxin Injection For Chronic Migraine  Botulism toxin has been approved by the Federal drug administration for treatment of chronic migraine. Botulism toxin does not cure chronic migraine and it may not be effective in some patients.  The administration of botulism toxin is accomplished by injecting a small amount of toxin into the muscles of the neck and head. Dosage must be titrated for each individual. Any benefits resulting from botulism toxin tend to wear off after 3 months with a repeat injection required if benefit is to be maintained. Injections are usually done every 3-4 months with maximum effect peak achieved by about 2 or 3 weeks. Botulism toxin is expensive and you should be sure of what costs you will incur resulting from the injection.  The side effects of botulism toxin use for chronic migraine may include:   -Transient, and usually mild, facial weakness with facial injections  -Transient, and usually mild, head or neck weakness with head/neck injections  -Reduction or loss of forehead facial animation due to forehead muscle              weakness  -Eyelid drooping  -Dry eye  -Pain at the site of injection or bruising at the site of injection  -Double vision  -Potential unknown long term risks  Contraindications: You should not have Botox if you are pregnant, nursing, allergic to albumin, have an infection, skin condition, or muscle weakness at the site of the injection, or have myasthenia gravis, Lambert-Eaton syndrome, or ALS.  It is also possible that as with any injection, there may be an allergic reaction or no effect from the  medication. Reduced effectiveness after repeated injections is sometimes seen and rarely infection at the injection site may occur. All care will be taken to prevent these side effects. If therapy is given over a long time, atrophy and wasting in the muscle injected may occur. Occasionally the patient's become refractory to treatment because they develop antibodies to the toxin. In this event, therapy needs to be modified.  I have read the above information and consent to the administration of botulism toxin.    ______________  _____   _________________  Patient signature     Date   Witness signature       BOTOX PROCEDURE NOTE FOR MIGRAINE HEADACHE    Contraindications and precautions discussed with patient(above). Aseptic procedure was observed and patient tolerated procedure. Procedure performed by Dr. Artemio Aly  The condition has existed for more than 6 months, and pt does not have a diagnosis of ALS, Myasthenia Gravis or Lambert-Eaton Syndrome. Risks and benefits of injections discussed and pt agrees to proceed with the procedure. Written consent obtained  These injections are medically necessary. He receives good benefits from these injections. These injections do not cause sedations or hallucinations which the oral therapies may cause.  Indication/Diagnosis: chronic migraine BOTOX(J0585) injection was performed according to protocol by [facility]. 200 units of BOTOX was dissolved into 4 cc NS.  NDC: [address]-40   Description of procedure:  The patient was placed in a sitting position. The standard protocol was used for Botox as follows, with 5 units of Botox injected at each site:   -  Procerus muscle, midline injection  -Corrugator muscle, bilateral injection  -Frontalis muscle, bilateral injection, with 2 sites each side, medial injection was performed in the upper one third of the frontalis muscle, in the region vertical from the medial inferior edge of the superior  orbital rim. The lateral injection was again in the upper one third of the forehead vertically above the lateral limbus of the cornea, 1.5 cm lateral to the medial injection site.  -Temporalis muscle injection, 4 sites, bilaterally. The first injection was 3 cm above the tragus of the ear, second injection site was 1.5 cm to 3 cm up from the first injection site in line with the tragus of the ear. The third injection site was 1.5-3 cm forward between the first 2 injection sites. The fourth injection site was 1.5 cm posterior to the second injection site.  -Occipitalis muscle injection, 3 sites, bilaterally. The first injection was done one half way between the occipital protuberance and the tip of the mastoid process behind the ear. The second injection site was done lateral and superior to the first, 1 fingerbreadth from the first injection. The third injection site was 1 fingerbreadth superiorly and medially from the first injection site.  -Cervical paraspinal muscle injection, 2 sites, bilateral knee first injection site was 1 cm from the midline of the cervical spine, 3 cm inferior to the lower border of the occipital protuberance. The second injection site was 1.5 cm superiorly and laterally to the first injection site.  -Trapezius muscle injection was performed at 3 sites, bilaterally. The first injection site was in the upper trapezius muscle halfway between the inflection point of the neck, and the acromion. The second injection site was one half way between the acromion and the first injection site. The third injection was done between the first injection site and the inflection point of the neck.   Will return for repeat injection in 3 months.   A 200 unit sof Botox was used, 155 units were injected, the rest of the Botox was wasted. The patient tolerated the procedure well, there were no complications of the above procedure.

## 2017-12-01 ENCOUNTER — Telehealth: Payer: Self-pay | Admitting: Neurology

## 2017-12-01 NOTE — Telephone Encounter (Signed)
I called and scheduled the patient for her next injection.  °

## 2017-12-31 ENCOUNTER — Encounter: Payer: Self-pay | Admitting: Neurology

## 2017-12-31 ENCOUNTER — Other Ambulatory Visit: Payer: Self-pay | Admitting: Neurology

## 2017-12-31 MED ORDER — ONDANSETRON HCL 8 MG PO TABS: 8.0000 mg | ORAL_TABLET | Freq: Three times a day (TID) | ORAL | 5 refills | Status: DC | PRN

## 2018-01-03 ENCOUNTER — Emergency Department (HOSPITAL_COMMUNITY)
Admission: EM | Admit: 2018-01-03 | Discharge: 2018-01-03 | Discharge status: Against Medical Advice | Payer: Commercial Managed Care - PPO

## 2018-01-03 NOTE — ED Notes (Signed)
PLEASE NOTE: Pt left. Stated that she was going to go to the [facility]. Pt moved OTF.

## 2018-01-25 ENCOUNTER — Encounter: Payer: Self-pay | Admitting: Neurology

## 2018-02-02 ENCOUNTER — Telehealth: Payer: Self-pay | Admitting: Neurology

## 2018-02-02 NOTE — Telephone Encounter (Signed)
I called and requested a refill for the patients Botox. She is scheduled for 02/10/18. DW

## 2018-03-01 ENCOUNTER — Other Ambulatory Visit: Payer: Self-pay | Admitting: Neurology

## 2018-03-02 ENCOUNTER — Ambulatory Visit: Payer: Commercial Managed Care - PPO | Admitting: Neurology

## 2018-03-02 DIAGNOSIS — G43709 Chronic migraine without aura, not intractable, without status migrainosus: Secondary | ICD-10-CM | POA: Diagnosis not present

## 2018-03-02 MED ORDER — TOPIRAMATE ER 50 MG PO CAP24: 100.0000 mg | ORAL_CAPSULE | Freq: Every day | ORAL | 0 refills | Status: DC

## 2018-03-02 MED ORDER — BUTALBITAL-APAP-CAFFEINE 50-325-40 MG PO TABS: ORAL_TABLET | ORAL | 3 refills | Status: AC

## 2018-03-02 NOTE — Patient Instructions (Signed)
Topiramate extended-release capsules What is this medicine? TOPIRAMATE (toe PYRE a mate) is used to treat seizures in adults or children with epilepsy. It is also used for the prevention of migraine headaches. This medicine may be used for other purposes; ask your health care provider or pharmacist if you have questions. COMMON BRAND NAME(S): Trokendi XR What should I tell my health care provider before I take this medicine? They need to know if you have any of these conditions: -cirrhosis of the liver or liver disease -diarrhea -glaucoma -kidney stones or kidney disease -lung disease like asthma, obstructive pulmonary disease, emphysema -metabolic acidosis -on a ketogenic diet -scheduled for surgery or a procedure -suicidal thoughts, plans, or attempt; a previous suicide attempt by you or a family member -an unusual or allergic reaction to topiramate, other medicines, foods, dyes, or preservatives -pregnant or trying to get pregnant -breast-feeding How should I use this medicine? Take this medicine by mouth with a glass of water. Follow the directions on the prescription label. Trokendi XR capsules must be swallowed whole. Do not sprinkle on food, break, crush, dissolve, or chew. Qudexy XR capsules may be swallowed whole or opened and sprinkled on a small amount of soft food. This mixture must be swallowed immediately. Do not chew or store mixture for later use. You may take this medicine with meals. Take your medicine at regular intervals. Do not take it more often than directed. Talk to your pediatrician regarding the use of this medicine in children. Special care may be needed. While Trokendi XR may be prescribed for children as young as 6 years and Qudexy XR may be prescribed for children as young as 2 years for selected conditions, precautions do apply. Overdosage: If you think you have taken too much of this medicine contact a poison control center or emergency room at once. NOTE: This  medicine is only for you. Do not share this medicine with others. What if I miss a dose? If you miss a dose, take it as soon as you can. If it is almost time for your next dose, take only that dose. Do not take double or extra doses. What may interact with this medicine? Do not take this medicine with any of the following medications: -probenecid This medicine may also interact with the following medications: -acetazolamide -alcohol -amitriptyline -birth control pills -digoxin -hydrochlorothiazide -lithium -medicines for pain, sleep, or muscle relaxation -metformin -methazolamide -other seizure or epilepsy medicines -pioglitazone -risperidone This list may not describe all possible interactions. Give your health care provider a list of all the medicines, herbs, non-prescription drugs, or dietary supplements you use. Also tell them if you smoke, drink alcohol, or use illegal drugs. Some items may interact with your medicine. What should I watch for while using this medicine? Visit your doctor or health care professional for regular checks on your progress. Do not stop taking this medicine suddenly. This increases the risk of seizures if you are using this medicine to control epilepsy. Wear a medical identification bracelet or chain to say you have epilepsy or seizures, and carry a card that lists all your medicines. This medicine can decrease sweating and increase your body temperature. Watch for signs of deceased sweating or fever, especially in children. Avoid extreme heat, hot baths, and saunas. Be careful about exercising, especially in hot weather. Contact your health care provider right away if you notice a fever or decrease in sweating. You should drink plenty of fluids while taking this medicine. If you have had kidney   stones in the past, this will help to reduce your chances of forming kidney stones. If you have stomach pain, with nausea or vomiting and yellowing of your eyes or  skin, call your doctor immediately. You may get drowsy, dizzy, or have blurred vision. Do not drive, use machinery, or do anything that needs mental alertness until you know how this medicine affects you. To reduce dizziness, do not sit or stand up quickly, especially if you are an older patient. Alcohol can increase drowsiness and dizziness. Avoid alcoholic drinks. Do not drink alcohol for 6 hours before or 6 hours after taking Trokendi XR. If you notice blurred vision, eye pain, or other eye problems, seek medical attention at once for an eye exam. The use of this medicine may increase the chance of suicidal thoughts or actions. Pay special attention to how you are responding while on this medicine. Any worsening of mood, or thoughts of suicide or dying should be reported to your health care professional right away. This medicine may increase the chance of developing metabolic acidosis. If left untreated, this can cause kidney stones, bone disease, or slowed growth in children. Symptoms include breathing fast, fatigue, loss of appetite, irregular heartbeat, or loss of consciousness. Call your doctor immediately if you experience any of these side effects. Also, tell your doctor about any surgery you plan on having while taking this medicine since this may increase your risk for metabolic acidosis. Birth control pills may not work properly while you are taking this medicine. Talk to your doctor about using an extra method of birth control. Women who become pregnant while using this medicine may enroll in the [place] Antiepileptic Drug Pregnancy Registry by calling 1-888-233-2334. This registry collects information about the safety of antiepileptic drug use during pregnancy. What side effects may I notice from receiving this medicine? Side effects that you should report to your doctor or health care professional as soon as possible: -allergic reactions like skin rash, itching or hives, swelling of  the face, lips, or tongue -decreased sweating and/or rise in body temperature -depression -difficulty breathing, fast or irregular breathing patterns -difficulty speaking -difficulty walking or controlling muscle movements -hearing impairment -redness, blistering, peeling or loosening of the skin, including inside the mouth -tingling, pain or numbness in the hands or feet -unusually weak or tired -worsening of mood, thoughts or actions of suicide or dying Side effects that usually do not require medical attention (report to your doctor or health care professional if they continue or are bothersome): -altered taste -back pain, joint or muscle aches and pains -diarrhea, or constipation -headache -loss of appetite -nausea -stomach upset, indigestion -tremors This list may not describe all possible side effects. Call your doctor for medical advice about side effects. You may report side effects to FDA at 1-800-FDA-1088. Where should I keep my medicine? Keep out of the reach of children. Store at room temperature between 15 and 30 degrees C (59 and 86 degrees F) in a tightly closed container. Protect from moisture. Throw away any unused medicine after the expiration date. NOTE: This sheet is a summary. It may not cover all possible information. If you have questions about this medicine, talk to your doctor, pharmacist, or health care provider.  2018 Elsevier/Gold Standard (2015-11-22 12:33:11)  

## 2018-03-02 NOTE — Progress Notes (Signed)
Interval History 11/24/2017: Patient's baseline is 20 headache days a month and 12 migraine days a month. This is third botox. She is on axert, elavil, cymbalta and Ajovy, indomethacin prn for exertional headache, zofran for nausea. Tried topiramate. She has >50% improvement with migraine frequency and severity. + eyes, + temples, + masseters, +LS  Consent Form Botulism Toxin Injection For Chronic Migraine  Botulism toxin has been approved by the Federal drug administration for treatment of chronic migraine. Botulism toxin does not cure chronic migraine and it may not be effective in some patients.  The administration of botulism toxin is accomplished by injecting a small amount of toxin into the muscles of the neck and head. Dosage must be titrated for each individual. Any benefits resulting from botulism toxin tend to wear off after 3 months with a repeat injection required if benefit is to be maintained. Injections are usually done every 3-4 months with maximum effect peak achieved by about 2 or 3 weeks. Botulism toxin is expensive and you should be sure of what costs you will incur resulting from the injection.  The side effects of botulism toxin use for chronic migraine may include:   -Transient, and usually mild, facial weakness with facial injections  -Transient, and usually mild, head or neck weakness with head/neck injections  -Reduction or loss of forehead facial animation due to forehead muscle              weakness  -Eyelid drooping  -Dry eye  -Pain at the site of injection or bruising at the site of injection  -Double vision  -Potential unknown long term risks  Contraindications: You should not have Botox if you are pregnant, nursing, allergic to albumin, have an infection, skin condition, or muscle weakness at the site of the injection, or have myasthenia gravis, Lambert-Eaton syndrome, or ALS.  It is also possible that as with any injection, there may be an allergic  reaction or no effect from the medication. Reduced effectiveness after repeated injections is sometimes seen and rarely infection at the injection site may occur. All care will be taken to prevent these side effects. If therapy is given over a long time, atrophy and wasting in the muscle injected may occur. Occasionally the patient's become refractory to treatment because they develop antibodies to the toxin. In this event, therapy needs to be modified.  I have read the above information and consent to the administration of botulism toxin.    ______________  _____   _________________  Patient signature     Date   Witness signature       BOTOX PROCEDURE NOTE FOR MIGRAINE HEADACHE    Contraindications and precautions discussed with patient(above). Aseptic procedure was observed and patient tolerated procedure. Procedure performed by Dr. Artemio Aly  The condition has existed for more than 6 months, and pt does not have a diagnosis of ALS, Myasthenia Gravis or Lambert-Eaton Syndrome. Risks and benefits of injections discussed and pt agrees to proceed with the procedure. Written consent obtained  These injections are medically necessary. He receives good benefits from these injections. These injections do not cause sedations or hallucinations which the oral therapies may cause.  Indication/Diagnosis: chronic migraine BOTOX(J0585) injection was performed according to protocol by [facility]. 200 units of BOTOX was dissolved into 4 cc NS.  NDC: [address]-78   Description of procedure:  The patient was placed in a sitting position. The standard protocol was used for Botox as follows, with 5 units of Botox  injected at each site:   -Procerus muscle, midline injection  -Corrugator muscle, bilateral injection  -Frontalis muscle, bilateral injection, with 2 sites each side, medial injection was performed in the upper one third of the frontalis muscle, in the region vertical from the medial  inferior edge of the superior orbital rim. The lateral injection was again in the upper one third of the forehead vertically above the lateral limbus of the cornea, 1.5 cm lateral to the medial injection site.  -Temporalis muscle injection, 4 sites, bilaterally. The first injection was 3 cm above the tragus of the ear, second injection site was 1.5 cm to 3 cm up from the first injection site in line with the tragus of the ear. The third injection site was 1.5-3 cm forward between the first 2 injection sites. The fourth injection site was 1.5 cm posterior to the second injection site.  -Occipitalis muscle injection, 3 sites, bilaterally. The first injection was done one half way between the occipital protuberance and the tip of the mastoid process behind the ear. The second injection site was done lateral and superior to the first, 1 fingerbreadth from the first injection. The third injection site was 1 fingerbreadth superiorly and medially from the first injection site.  -Cervical paraspinal muscle injection, 2 sites, bilateral knee first injection site was 1 cm from the midline of the cervical spine, 3 cm inferior to the lower border of the occipital protuberance. The second injection site was 1.5 cm superiorly and laterally to the first injection site.  -Trapezius muscle injection was performed at 3 sites, bilaterally. The first injection site was in the upper trapezius muscle halfway between the inflection point of the neck, and the acromion. The second injection site was one half way between the acromion and the first injection site. The third injection was done between the first injection site and the inflection point of the neck.   Will return for repeat injection in 3 months.   A 200 unit sof Botox was used, 155 units were injected, the rest of the Botox was wasted. The patient tolerated the procedure well, there were no complications of the above procedure.

## 2018-03-02 NOTE — Progress Notes (Signed)
Botox- 100 units x 2 vials Lot: [id number] Expiration: 08/2020 NDC: [id number]  Bacteriostatic 0.9% Sodium Chloride- 4mL total Lot: [id number] Expiration: 12/16/2018 NDC: [id number]  Dx: [id number] S/P

## 2018-04-23 ENCOUNTER — Other Ambulatory Visit: Payer: Self-pay | Admitting: Neurology

## 2018-06-03 ENCOUNTER — Other Ambulatory Visit: Payer: Self-pay | Admitting: Neurology

## 2018-06-03 ENCOUNTER — Ambulatory Visit (INDEPENDENT_AMBULATORY_CARE_PROVIDER_SITE_OTHER): Payer: Commercial Managed Care - PPO | Admitting: Neurology

## 2018-06-03 DIAGNOSIS — G43709 Chronic migraine without aura, not intractable, without status migrainosus: Secondary | ICD-10-CM | POA: Diagnosis not present

## 2018-06-03 MED ORDER — ALMOTRIPTAN MALATE 12.5 MG PO TABS: 12.5000 mg | ORAL_TABLET | ORAL | 11 refills | Status: DC | PRN

## 2018-06-03 NOTE — Progress Notes (Signed)
Consent Form Botulism Toxin Injection For Chronic Migraine  Interval History 11/24/2017: This is her 4th botox. She has done exceptionally well and can now train for a marathon.  Patient's baseline is 20 headache days a month and 12 migraine days a month. This is 4th botox. She is on axert acutely and indomethacin prn for exertional headache, zofran for nausea. Tried topiramate. 20 headache free days a month, 10 headache days a month with 6 migaines. >50% improvement.  She has tried multiple SSRis and is not on one now, she prefers not to try another one right now. Recommend Prozac + wellbutrin combination but she wants to hold off at this time for her anxiety.    + orb, + temples, + masseters, +LS  Reviewed orally with patient, additionally signature is on file:  Botulism toxin has been approved by the Federal drug administration for treatment of chronic migraine. Botulism toxin does not cure chronic migraine and it may not be effective in some patients.  The administration of botulism toxin is accomplished by injecting a small amount of toxin into the muscles of the neck and head. Dosage must be titrated for each individual. Any benefits resulting from botulism toxin tend to wear off after 3 months with a repeat injection required if benefit is to be maintained. Injections are usually done every 3-4 months with maximum effect peak achieved by about 2 or 3 weeks. Botulism toxin is expensive and you should be sure of what costs you will incur resulting from the injection.  The side effects of botulism toxin use for chronic migraine may include:   -Transient, and usually mild, facial weakness with facial injections  -Transient, and usually mild, head or neck weakness with head/neck injections  -Reduction or loss of forehead facial animation due to forehead muscle weakness  -Eyelid drooping  -Dry eye  -Pain at the site of injection or bruising at the site of injection  -Double  vision  -Potential unknown long term risks  Contraindications: You should not have Botox if you are pregnant, nursing, allergic to albumin, have an infection, skin condition, or muscle weakness at the site of the injection, or have myasthenia gravis, Lambert-Eaton syndrome, or ALS.  It is also possible that as with any injection, there may be an allergic reaction or no effect from the medication. Reduced effectiveness after repeated injections is sometimes seen and rarely infection at the injection site may occur. All care will be taken to prevent these side effects. If therapy is given over a long time, atrophy and wasting in the muscle injected may occur. Occasionally the patient's become refractory to treatment because they develop antibodies to the toxin. In this event, therapy needs to be modified.  I have read the above information and consent to the administration of botulism toxin.    BOTOX PROCEDURE NOTE FOR MIGRAINE HEADACHE    Contraindications and precautions discussed with patient(above). Aseptic procedure was observed and patient tolerated procedure. Procedure performed by Dr. Artemio Aly  The condition has existed for more than 6 months, and pt does not have a diagnosis of ALS, Myasthenia Gravis or Lambert-Eaton Syndrome.  Risks and benefits of injections discussed and pt agrees to proceed with the procedure.  Written consent obtained  These injections are medically necessary. Pt  receives good benefits from these injections. These injections do not cause sedations or hallucinations which the oral therapies may cause.  Description of procedure:  The patient was placed in a sitting position. The standard protocol  was used for Botox as follows, with 5 units of Botox injected at each site:   -Procerus muscle, midline injection  -Corrugator muscle, bilateral injection  -Frontalis muscle, bilateral injection, with 2 sites each side, medial injection was performed in the upper  one third of the frontalis muscle, in the region vertical from the medial inferior edge of the superior orbital rim. The lateral injection was again in the upper one third of the forehead vertically above the lateral limbus of the cornea, 1.5 cm lateral to the medial injection site.  - Levator Scapulae: 5 units bilaterally  -Temporalis muscle injection, 5 sites, bilaterally. The first injection was 3 cm above the tragus of the ear, second injection site was 1.5 cm to 3 cm up from the first injection site in line with the tragus of the ear. The third injection site was 1.5-3 cm forward between the first 2 injection sites. The fourth injection site was 1.5 cm posterior to the second injection site. 5th site laterally in the temporalis  muscleat the level of the outer canthus.  - Patient feels her clenching is a trigger for headaches. +5 units masseter bilaterally   - Patient feels the migraines are centered around the eyes +5 units bilaterally at the outer canthus in the orbicularis occuli  -Occipitalis muscle injection, 3 sites, bilaterally. The first injection was done one half way between the occipital protuberance and the tip of the mastoid process behind the ear. The second injection site was done lateral and superior to the first, 1 fingerbreadth from the first injection. The third injection site was 1 fingerbreadth superiorly and medially from the first injection site.  -Cervical paraspinal muscle injection, 2 sites, bilateral knee first injection site was 1 cm from the midline of the cervical spine, 3 cm inferior to the lower border of the occipital protuberance. The second injection site was 1.5 cm superiorly and laterally to the first injection site.  -Trapezius muscle injection was performed at 3 sites, bilaterally. The first injection site was in the upper trapezius muscle halfway between the inflection point of the neck, and the acromion. The second injection site was one half way between  the acromion and the first injection site. The third injection was done between the first injection site and the inflection point of the neck.   Will return for repeat injection in 3 months.   A 200 unit sof Botox was used, any Botox not injected was wasted. The patient tolerated the procedure well, there were no complications of the above procedure.

## 2018-06-03 NOTE — Progress Notes (Signed)
Botox- 100 units x 2 vials Lot: [id number] Expiration: 10/2020 NDC: 1478-2956-21  Bacteriostatic 0.9% Sodium Chloride- 4mL total Lot: [id number] Expiration: 05/18/2019 NDC: 8469-6295-28  Dx: U13.244 S/P

## 2018-08-17 DIAGNOSIS — R42 Dizziness and giddiness: Secondary | ICD-10-CM

## 2018-08-17 HISTORY — DX: Dizziness and giddiness: R42

## 2018-08-30 ENCOUNTER — Telehealth: Payer: Self-pay | Admitting: Neurology

## 2018-08-30 NOTE — Telephone Encounter (Signed)
I called [facility] to request a refill on the patients medication. It is ready to ship but pending patient consent.   They tried to call the patient but she did not answer. I called her and asked her to give consent.

## 2018-08-30 NOTE — Telephone Encounter (Signed)
Noted, thank you

## 2018-08-30 NOTE — Telephone Encounter (Signed)
Pt states she has had a cluster of migraines in the past 72hours and has been taking her medication but it is not easing up and pt is pretty miserable. Pt would like to know what else she can do. Please advise.

## 2018-08-30 NOTE — Telephone Encounter (Signed)
FYI [facility] called to inform that on 09-01-2018 botox will be delivered with signature required with delivery

## 2018-08-31 NOTE — Telephone Encounter (Signed)
I called the patient. She reported a cluster of migraine since Saturday. Her last triptan use was Sunday night. She took Fioricet this AM, Indomethacin overnight, Toradol shot from PCP yesterday evening. She also took Zofran for nausea. Pt denied any new symptoms but stated this is lasting longer than usual. She has a botox appt scheduled for 1/21. I advised patient I would d/w Dr. Lucia Gaskins and call her back.

## 2018-08-31 NOTE — Telephone Encounter (Signed)
Discussed with Dr. Lucia Gaskins. Will offer medrol dose pack or infusion. Spoke with patient. She does not want any steroids. She agreed to migraine infusion, depacon, compazine if needed. She did take Zofran this AM. I spoke with Dr. Lucia Gaskins and she ordered Depacon 1 Gram IV x 1 and Toradol 30 mg IV x 1. Spoke with patient and she is aware and will come now for infusion. Orders written and printed insurance card.

## 2018-09-06 ENCOUNTER — Ambulatory Visit (INDEPENDENT_AMBULATORY_CARE_PROVIDER_SITE_OTHER): Payer: Commercial Managed Care - PPO | Admitting: Neurology

## 2018-09-06 ENCOUNTER — Telehealth: Payer: Self-pay | Admitting: Neurology

## 2018-09-06 DIAGNOSIS — G43709 Chronic migraine without aura, not intractable, without status migrainosus: Secondary | ICD-10-CM

## 2018-09-06 MED ORDER — SUMATRIPTAN 10 MG/ACT NA SOLN: 10.0000 mg | NASAL | 0 refills | Status: DC

## 2018-09-06 NOTE — Progress Notes (Signed)
Consent Form Botulism Toxin Injection For Chronic Migraine  Interval History 11/24/2017: This is her 4th botox. She has done exceptionally well and can now train for a marathon.  Patient's baseline is 20 headache days a month and 12 migraine days a month. This is 4th botox. She is on axert acutely and indomethacin prn for exertional headache, zofran for nausea. Tried topiramate. 20 headache free days a month, 10 headache days a month with 6 migaines. >50% improvement.  She has tried multiple SSRis and is not on one now, she prefers not to try another one right now. Recommend Prozac + wellbutrin combination but she wants to hold off at this time for her anxiety. +masseters, +temple. She fell and bruised ribs and abdomen.    + orb, + temples, + masseters, +LS  Reviewed orally with patient, additionally signature is on file:  Botulism toxin has been approved by the Federal drug administration for treatment of chronic migraine. Botulism toxin does not cure chronic migraine and it may not be effective in some patients.  The administration of botulism toxin is accomplished by injecting a small amount of toxin into the muscles of the neck and head. Dosage must be titrated for each individual. Any benefits resulting from botulism toxin tend to wear off after 3 months with a repeat injection required if benefit is to be maintained. Injections are usually done every 3-4 months with maximum effect peak achieved by about 2 or 3 weeks. Botulism toxin is expensive and you should be sure of what costs you will incur resulting from the injection.  The side effects of botulism toxin use for chronic migraine may include:   -Transient, and usually mild, facial weakness with facial injections  -Transient, and usually mild, head or neck weakness with head/neck injections  -Reduction or loss of forehead facial animation due to forehead muscle weakness  -Eyelid drooping  -Dry eye  -Pain at the site of injection or  bruising at the site of injection  -Double vision  -Potential unknown long term risks  Contraindications: You should not have Botox if you are pregnant, nursing, allergic to albumin, have an infection, skin condition, or muscle weakness at the site of the injection, or have myasthenia gravis, Lambert-Eaton syndrome, or ALS.  It is also possible that as with any injection, there may be an allergic reaction or no effect from the medication. Reduced effectiveness after repeated injections is sometimes seen and rarely infection at the injection site may occur. All care will be taken to prevent these side effects. If therapy is given over a long time, atrophy and wasting in the muscle injected may occur. Occasionally the patient's become refractory to treatment because they develop antibodies to the toxin. In this event, therapy needs to be modified.  I have read the above information and consent to the administration of botulism toxin.    BOTOX PROCEDURE NOTE FOR MIGRAINE HEADACHE    Contraindications and precautions discussed with patient(above). Aseptic procedure was observed and patient tolerated procedure. Procedure performed by Dr. Artemio Alyoni Jenavieve Freda  The condition has existed for more than 6 months, and pt does not have a diagnosis of ALS, Myasthenia Gravis or Lambert-Eaton Syndrome.  Risks and benefits of injections discussed and pt agrees to proceed with the procedure.  Written consent obtained  These injections are medically necessary. Pt  receives good benefits from these injections. These injections do not cause sedations or hallucinations which the oral therapies may cause.  Description of procedure:  The patient  was placed in a sitting position. The standard protocol was used for Botox as follows, with 5 units of Botox injected at each site:   -Procerus muscle, midline injection  -Corrugator muscle, bilateral injection  -Frontalis muscle, bilateral injection, with 2 sites each side,  medial injection was performed in the upper one third of the frontalis muscle, in the region vertical from the medial inferior edge of the superior orbital rim. The lateral injection was again in the upper one third of the forehead vertically above the lateral limbus of the cornea, 1.5 cm lateral to the medial injection site.  -Temporalis muscle injection, 5 sites, bilaterally. The first injection was 3 cm above the tragus of the ear, second injection site was 1.5 cm to 3 cm up from the first injection site in line with the tragus of the ear. The third injection site was 1.5-3 cm forward between the first 2 injection sites. The fourth injection site was 1.5 cm posterior to the second injection site. 5th site laterally in the temporalis  muscleat the level of the outer canthus.  - Patient feels her clenching is a trigger for headaches. +5 units masseter bilaterally   -Occipitalis muscle injection, 3 sites, bilaterally. The first injection was done one half way between the occipital protuberance and the tip of the mastoid process behind the ear. The second injection site was done lateral and superior to the first, 1 fingerbreadth from the first injection. The third injection site was 1 fingerbreadth superiorly and medially from the first injection site.  -Cervical paraspinal muscle injection, 2 sites, bilateral knee first injection site was 1 cm from the midline of the cervical spine, 3 cm inferior to the lower border of the occipital protuberance. The second injection site was 1.5 cm superiorly and laterally to the first injection site.  -Trapezius muscle injection was performed at 3 sites, bilaterally. The first injection site was in the upper trapezius muscle halfway between the inflection point of the neck, and the acromion. The second injection site was one half way between the acromion and the first injection site. The third injection was done between the first injection site and the inflection point  of the neck.   Will return for repeat injection in 3 months.   A 200 unit sof Botox was used, any Botox not injected was wasted. The patient tolerated the procedure well, there were no complications of the above procedure.

## 2018-09-06 NOTE — Patient Instructions (Signed)
Sumatriptan nasal spray What is this medicine? SUMATRIPTAN (soo ma TRIP tan) is used to treat migraines with or without aura. An aura is a strange feeling or visual disturbance that warns you of an attack. It is not used to prevent migraines. This medicine may be used for other purposes; ask your health care provider or pharmacist if you have questions. COMMON BRAND NAME(S): Imitrex, Tosymra What should I tell my health care provider before I take this medicine? They need to know if you have any of these conditions: -cigarette smoker -circulation problems in fingers and toes -diabetes -heart disease -high blood pressure -high cholesterol -history of irregular heartbeat -history of stroke -kidney disease -liver disease -stomach or intestine problems -an unusual or allergic reaction to sumatriptan, other medicines, foods, dyes, or preservatives -pregnant or trying to get pregnant -breast-feeding How should I use this medicine? This medicine is for use in the nose. Follow the directions on your product or prescription label. Do not use more often than directed. Make sure that you are using your nasal spray correctly. Ask your doctor or health care professional if you have any questions. Talk to your pediatrician regarding the use of this medicine in children. Special care may be needed. Overdosage: If you think you have taken too much of this medicine contact a poison control center or emergency room at once. NOTE: This medicine is only for you. Do not share this medicine with others. What if I miss a dose? This does not apply. This medicine is not for regular use. What may interact with this medicine? Do not take this medicine with any of the following medicines: -certain medicines for migraine headache like almotriptan, eletriptan, frovatriptan, naratriptan, rizatriptan, sumatriptan, zolmitriptan -ergot alkaloids like dihydroergotamine, ergonovine, ergotamine, methylergonovine -MAOIs  like Carbex, Eldepryl, Marplan, Nardil, and Parnate This medicine may also interact with the following medications: -certain medicines for depression, anxiety, or psychotic disorders This list may not describe all possible interactions. Give your health care provider a list of all the medicines, herbs, non-prescription drugs, or dietary supplements you use. Also tell them if you smoke, drink alcohol, or use illegal drugs. Some items may interact with your medicine. What should I watch for while using this medicine? Visit your healthcare professional for regular checks on your progress. Tell your healthcare professional if your symptoms do not start to get better or if they get worse. You may get drowsy or dizzy. Do not drive, use machinery, or do anything that needs mental alertness until you know how this medicine affects you. Do not stand up or sit up quickly, especially if you are an older patient. This reduces the risk of dizzy or fainting spells. Alcohol may interfere with the effect of this medicine. Tell your healthcare professional right away if you have any change in your eyesight. If you take migraine medicines for 10 or more days a month, your migraines may get worse. Keep a diary of headache days and medicine use. Contact your healthcare professional if your migraine attacks occur more frequently. What side effects may I notice from receiving this medicine? Side effects that you should report to your doctor or health care professional as soon as possible: -allergic reactions like skin rash, itching or hives, swelling of the face, lips, or tongue -changes in vision -chest pain or chest tightness -signs and symptoms of a dangerous change in heartbeat or heart rhythm like chest pain; dizziness; fast, irregular heartbeat; palpitations; feeling faint or lightheaded; falls; breathing problems -signs and symptoms   of a stroke like changes in vision; confusion; trouble speaking or understanding;  severe headaches; sudden numbness or weakness of the face, arm or leg; trouble walking; dizziness; loss of balance or coordination -signs and symptoms of serotonin syndrome like irritable; confusion; diarrhea; fast or irregular heartbeat; muscle twitching; stiff muscles; trouble walking; sweating; high fever; seizures; chills; vomiting Side effects that usually do not require medical attention (report to your doctor or health care professional if they continue or are bothersome): -changes in taste -diarrhea -dizziness -drowsiness -dry mouth -headache -nausea, vomiting -pain, tingling, numbness in the hands or feet -stomach pain This list may not describe all possible side effects. Call your doctor for medical advice about side effects. You may report side effects to FDA at 1-800-FDA-1088. Where should I keep my medicine? Keep out of the reach of children. Store at room temperature between 2 and 30 degrees C (36 and 86 degrees F). Throw away any unused medicine after the expiration date. NOTE: This sheet is a summary. It may not cover all possible information. If you have questions about this medicine, talk to your doctor, pharmacist, or health care provider.  2019 [employer] Standard (2018-02-15 15:08:07)  

## 2018-09-06 NOTE — Progress Notes (Signed)
Botox- 100 units x 2 vials Lot: [id number] Expiration: 12/2020 NDC: 6837-2902-11  Bacteriostatic 0.9% Sodium Chloride- 32mL total Lot: [id number] Expiration: 05/18/2019 NDC: 0223-3612-24  Dx: S97.530 S/P

## 2018-09-06 NOTE — Telephone Encounter (Signed)
3 mo Botox inj  °

## 2018-09-07 NOTE — Telephone Encounter (Signed)
I called and scheduled the patient.  °

## 2018-09-21 ENCOUNTER — Other Ambulatory Visit: Payer: Self-pay | Admitting: Neurology

## 2018-09-21 MED ORDER — SUMATRIPTAN 10 MG/ACT NA SOLN: 10.0000 mg | NASAL | 6 refills | Status: DC

## 2018-11-24 ENCOUNTER — Encounter: Payer: Self-pay | Admitting: *Deleted

## 2018-11-24 ENCOUNTER — Telehealth: Payer: Self-pay | Admitting: *Deleted

## 2018-11-24 NOTE — Telephone Encounter (Signed)
Per Cover My Meds Tosymra approved by [employer] [employer] from 11/24/2018 through 11/24/2019.   Sent pt mychart message notifying of approval.

## 2018-11-24 NOTE — Telephone Encounter (Signed)
Completed Tosymra 10 mg PA on Cover My [employer]s. KEY: [id number]. Determination by [employer] anticipated within 24 hours.   [employer]: [phone]

## 2018-11-29 ENCOUNTER — Telehealth: Payer: Self-pay | Admitting: *Deleted

## 2018-11-29 ENCOUNTER — Encounter: Payer: Self-pay | Admitting: *Deleted

## 2018-11-29 NOTE — Telephone Encounter (Signed)
Patient provided chart updates in mychart encounter. Chart updated in phone note.

## 2018-11-30 ENCOUNTER — Other Ambulatory Visit: Payer: Self-pay

## 2018-11-30 ENCOUNTER — Ambulatory Visit (INDEPENDENT_AMBULATORY_CARE_PROVIDER_SITE_OTHER): Payer: Commercial Managed Care - PPO | Admitting: Neurology

## 2018-11-30 VITALS — Temp 98.7°F

## 2018-11-30 DIAGNOSIS — G43709 Chronic migraine without aura, not intractable, without status migrainosus: Secondary | ICD-10-CM | POA: Diagnosis not present

## 2018-11-30 DIAGNOSIS — M542 Cervicalgia: Secondary | ICD-10-CM | POA: Insufficient documentation

## 2018-11-30 NOTE — Progress Notes (Addendum)
[facility]    Provider:  Dr Lucia Gaskins Referring Provider: Salli Real, MD Primary Care Physician:  Darrow Bussing, MD  CC:  Migraine  Virtual Visit via Telephone Note  I connected with Destiny Ellis on 11/30/18 at 11:30 AM EDT by telephone and verified that I am speaking with the correct person using two identifiers.   I discussed the limitations, risks, security and privacy concerns of performing an evaluation and management service by telephone and the availability of in person appointments. I also discussed with the patient that there may be a patient responsible charge related to this service. The patient expressed understanding and agreed to proceed.  Follow Up Instructions:    I discussed the assessment and treatment plan with the patient. The patient was provided an opportunity to ask questions and all were answered. The patient agreed with the plan and demonstrated an understanding of the instructions.   The patient was advised to call back or seek an in-person evaluation if the symptoms worsen or if the condition fails to improve as anticipated.  I provided 30 minutes of non-face-to-face time during this encounter.   Anson Fret, MD    Interval history 11/30/2018:  Patient was doing exceptionally well on botox. Baseline 20 headache days of which 12 migraines. > 50% improvement.  She also started Ajovy 11/2017 and has been on it a year as well as Trokendi . Acute she has Axert. Last botox was 09/06/2018. We cannot perform the next botox due to Covid19 restrictions. She has had a headache since the beginning of this month. If she does not get her botox she will go to ED and last time she had to have an infusion when she missed her migraines.  HPI:  Destiny Ellis is a 28 y.o. female here as a referral from Dr. Wynelle Link for migraines. Past medical history of major depression, generalized anxiety disorder, irritable bowel syndrome, endometriosis, chronic migraine.   Migraines since the 4th grade. Sleeping helps when she has one. Worsened in HS. Father with migraines and cluster headaches. 3 years ago migraines worsening(20 headache days a months, >12 migrainous), 3 migraine days a week since then at least , MRI of the brain was negative. She started Topiramate. She was on  daily. Topiramate was making her foggy. Migraines without aura, start behind the right eye, the right side of her face throbs, worse around the eye, her whole face is throbbing and her eye droops. Light, sound, smells bother her with nausea and can last 24 hours and be severe. 20 days out of month or more will have headaches. Most (>12 headache days a month) headaches are migrainous. She is missing work. Alcohol triggers. She has a lot of nausea and anxiety. No medication overuse. Her dog also hit her in the face last week. She has had 3 concussions in 2004, 2009. Since her dog hit her she has been having more headaches.No other focal neurologic deficits, associated symptoms, inciting events or modifiable factors. Husband here with patient and also provides information.  Meds tried: Imitrex. Maxalt, Topiramate, propranolol, zoloft  Reviewed notes, labs and imaging from outside physicians, which showed:   Reviewed primary care notes from the physician. CMP normal with BUN 14 and creatinine 0.87 June 2018  Patient seen in the ED 03/03/2017 for concussion. Collided heads with her dog. Immediate searing pain in her nose and head, mild nausea, no vision changes or focal neuro symptoms. She had mild bruising on her nasal bridge.otherwise normal exam. No  neurologic deficits. Brain imaging not indicated. Discharged.   Review of Systems: Patient complains of symptoms per HPI as well as the following symptoms: no CP, no SOB. Pertinent negatives and positives per HPI. All others negative.   Social History   Socioeconomic History  . Marital status: Significant Other    Spouse name: Not on file  .  Number of children: Not on file  . Years of education: Not on file  . Highest education level: Not on file  Occupational History  . Not on file  Social Needs  . Financial resource strain: Not on file  . Food insecurity:    Worry: Not on file    Inability: Not on file  . Transportation needs:    Medical: Not on file    Non-medical: Not on file  Tobacco Use  . Smoking status: Never Smoker  . Smokeless tobacco: Never Used  Substance and Sexual Activity  . Alcohol use: Yes    Comment: occasional  . Drug use: No  . Sexual activity: Yes    Birth control/protection: Pill  Lifestyle  . Physical activity:    Days per week: Not on file    Minutes per session: Not on file  . Stress: Not on file  Relationships  . Social connections:    Talks on phone: Not on file    Gets together: Not on file    Attends religious service: Not on file    Active member of club or organization: Not on file    Attends meetings of clubs or organizations: Not on file    Relationship status: Not on file  . Intimate partner violence:    Fear of current or ex partner: Not on file    Emotionally abused: Not on file    Physically abused: Not on file    Forced sexual activity: Not on file  Other Topics Concern  . Not on file  Social History Narrative   ** Merged History Encounter **        Family History  Problem Relation Age of Onset  . Hyperthyroidism Mother   . Hypertension Father   . Bipolar disorder Father   . Migraines Father   . Healthy Brother     Past Medical History:  Diagnosis Date  . Dizziness 2020   pt states she attributes this to "sinus pressure & pollen being out of control"  . Endometriosis   . Headache   . Migraines   . Ovarian cyst   . Upper respiratory infection 2019   severe, required chest xrays, Abx, steroids, heart echo  . Vision abnormalities     Past Surgical History:  Procedure Laterality Date  . LAPAROSCOPY ABDOMEN DIAGNOSTIC    . laporoscopy    .  TONSILLECTOMY      Current Outpatient Medications  Medication Sig Dispense Refill  . almotriptan (AXERT) 12.5 MG tablet Take 1 tablet (12.5 mg total) by mouth as needed for migraine. may repeat in 2 hours if needed 10 tablet 11  . BOTOX 100 units SOLR injection INJECT 155 UNITS INTRAMUSCULARLY EVERY 3 MONTHS (GIVEN AT PRESCRIBERS OFFICE, DISCARD UNUSED AFTER 1ST USE) 2 vial 3  . butalbital-acetaminophen-caffeine (FIORICET, ESGIC) 50-325-40 MG tablet Take 1-2 tabs every 6 hours as needed for headache 30 tablet 3  . cetirizine (ZYRTEC) 10 MG tablet Take 10 mg by mouth daily.    . clonazePAM (KLONOPIN) 0.5 MG tablet Take 0.5 mg by mouth daily.    . diazepam (VALIUM) 5 MG  tablet INSERT 1 TABLET (5 MG) TABLET INTO VAGINA AT NIGHT TO TREAT PELVIC FLOOR MUSCLE SPASM.    Marland Kitchen. Fremanezumab-vfrm (AJOVY) 225 MG/1.5ML SOSY Inject 225 mg into the skin every 30 (thirty) days. 1 Syringe 11  . indomethacin (INDOCIN) 50 MG capsule Take 1 capsule 30 minutes before exertion up to twice a day. 60 capsule 11  . JUNEL 1/20 1-20 MG-MCG tablet TAKE 1 TABLET BY MOUTH EVERY DAY 84 tablet 2  . montelukast (SINGULAIR) 10 MG tablet     . ondansetron (ZOFRAN) 8 MG tablet Take 1 tablet (8 mg total) by mouth every 8 (eight) hours as needed for nausea or vomiting. 20 tablet 5  . spironolactone (ALDACTONE) 25 MG tablet Take 25 mg by mouth daily.    . SUMAtriptan (TOSYMRA) 10 MG/ACT SOLN Place 10 mg into the nose every hour. Maximum 3 doses per day. 6 each 6  . tiZANidine (ZANAFLEX) 4 MG tablet Take 1 tablet (4 mg total) by mouth every 6 (six) hours as needed for muscle spasms. 30 tablet 11  . Topiramate ER (TROKENDI XR) 50 MG CP24 Take 100 mg by mouth at bedtime. 30 capsule 0   No current facility-administered medications for this visit.     Allergies as of 11/30/2018 - Review Complete 11/29/2018  Allergen Reaction Noted  . Other  11/29/2018  . Sulfa antibiotics  03/03/2017  . Sulfa antibiotics Hives 03/11/2017    Vitals:  There were no vitals taken for this visit. Last Weight:  Wt Readings from Last 1 Encounters:  03/11/17 148 lb 3.2 oz (67.2 kg)   Last Height:   Ht Readings from Last 1 Encounters:  03/11/17 5\' 3"  (1.6 m)   PRIOR EXAMS:  Physical exam: Exam: Gen: NAD, conversant, well nourised, well groomed                     CV: RRR, no MRG. No Carotid Bruits. No peripheral edema, warm, nontender Eyes: Conjunctivae clear without exudates or hemorrhage  Neuro: Detailed Neurologic Exam  Speech:    Speech is normal; fluent and spontaneous with normal comprehension.  Cognition:    The patient is oriented to person, place, and time;     recent and remote memory intact;     language fluent;     normal attention, concentration,     fund of knowledge Cranial Nerves:    The pupils are equal, round, and reactive to light. The fundi are normal and spontaneous venous pulsations are present. Visual fields are full to finger confrontation. Extraocular movements are intact. Trigeminal sensation is intact and the muscles of mastication are normal. The face is symmetric. The palate elevates in the midline. Hearing intact. Voice is normal. Shoulder shrug is normal. The tongue has normal motion without fasciculations.   Coordination:    Normal finger to nose and heel to shin. Normal rapid alternating movements.   Gait:    Heel-toe and tandem gait are normal.   Motor Observation:    No asymmetry, no atrophy, and no involuntary movements noted. Tone:    Normal muscle tone.    Posture:    Posture is normal. normal erect    Strength:    Strength is V/V in the upper and lower limbs.      Sensation: intact to LT     Reflex Exam:  DTR's:    Deep tendon reflexes in the upper and lower extremities are normal bilaterally.   Toes:    The toes are  downgoing bilaterally.   Clonus:    Clonus is absent.      Assessment/Plan:  28 year old with chronic migraines.     If she does not get her botox she  will go to ED and last time she had to have an infusion when she missed her botox. We will do botox in the office today.  Cc: Koirala, Dibas, MD To be performed later today:   Consent Form Botulism Toxin Injection For Chronic Migraine  Interval History 11/29/2017: This is her 5th botox. She has done exceptionally well and can now train for a marathon.  Patient's baseline is 20 headache days a month and 12 migraine days a month. This is 4th botox. She is on axert acutely and indomethacin prn for exertional headache, zofran for nausea. Tried topiramate. 20 headache free days a month, 10 headache days a month with 6 migaines. >50% improvement.  She has tried multiple SSRis and is not on one now, she prefers not to try another one right now. Recommend Prozac + wellbutrin combination but she wants to hold off at this time for her anxiety. +masseters, +temple. She fell and bruised ribs and abdomen.    + orb, + temples, + masseters, +LS  Reviewed orally with patient, additionally signature is on file:  Botulism toxin has been approved by the Federal drug administration for treatment of chronic migraine. Botulism toxin does not cure chronic migraine and it may not be effective in some patients.  The administration of botulism toxin is accomplished by injecting a small amount of toxin into the muscles of the neck and head. Dosage must be titrated for each individual. Any benefits resulting from botulism toxin tend to wear off after 3 months with a repeat injection required if benefit is to be maintained. Injections are usually done every 3-4 months with maximum effect peak achieved by about 2 or 3 weeks. Botulism toxin is expensive and you should be sure of what costs you will incur resulting from the injection.  The side effects of botulism toxin use for chronic migraine may include:   -Transient, and usually mild, facial weakness with facial injections  -Transient, and usually mild, head or neck  weakness with head/neck injections  -Reduction or loss of forehead facial animation due to forehead muscle weakness  -Eyelid drooping  -Dry eye  -Pain at the site of injection or bruising at the site of injection  -Double vision  -Potential unknown long term risks  Contraindications: You should not have Botox if you are pregnant, nursing, allergic to albumin, have an infection, skin condition, or muscle weakness at the site of the injection, or have myasthenia gravis, Lambert-Eaton syndrome, or ALS.  It is also possible that as with any injection, there may be an allergic reaction or no effect from the medication. Reduced effectiveness after repeated injections is sometimes seen and rarely infection at the injection site may occur. All care will be taken to prevent these side effects. If therapy is given over a long time, atrophy and wasting in the muscle injected may occur. Occasionally the patient's become refractory to treatment because they develop antibodies to the toxin. In this event, therapy needs to be modified.  I have read the above information and consent to the administration of botulism toxin.    BOTOX PROCEDURE NOTE FOR MIGRAINE HEADACHE    Contraindications and precautions discussed with patient(above). Aseptic procedure was observed and patient tolerated procedure. Procedure performed by Dr. Artemio Aly  The condition has  existed for more than 6 months, and pt does not have a diagnosis of ALS, Myasthenia Gravis or Lambert-Eaton Syndrome.  Risks and benefits of injections discussed and pt agrees to proceed with the procedure.  Written consent obtained  These injections are medically necessary. Pt  receives good benefits from these injections. These injections do not cause sedations or hallucinations which the oral therapies may cause.  Description of procedure:  The patient was placed in a sitting position. The standard protocol was used for Botox as follows, with 5 units  of Botox injected at each site:   -Procerus muscle, midline injection  -Corrugator muscle, bilateral injection  -Frontalis muscle, bilateral injection, with 2 sites each side, medial injection was performed in the upper one third of the frontalis muscle, in the region vertical from the medial inferior edge of the superior orbital rim. The lateral injection was again in the upper one third of the forehead vertically above the lateral limbus of the cornea, 1.5 cm lateral to the medial injection site.  -Temporalis muscle injection, 5 sites, bilaterally. The first injection was 3 cm above the tragus of the ear, second injection site was 1.5 cm to 3 cm up from the first injection site in line with the tragus of the ear. The third injection site was 1.5-3 cm forward between the first 2 injection sites. The fourth injection site was 1.5 cm posterior to the second injection site. 5th site laterally in the temporalis  muscleat the level of the outer canthus.  - Patient feels her clenching is a trigger for headaches. +5 units masseter bilaterally   -Occipitalis muscle injection, 3 sites, bilaterally. The first injection was done one half way between the occipital protuberance and the tip of the mastoid process behind the ear. The second injection site was done lateral and superior to the first, 1 fingerbreadth from the first injection. The third injection site was 1 fingerbreadth superiorly and medially from the first injection site.  -Cervical paraspinal muscle injection, 2 sites, bilateral knee first injection site was 1 cm from the midline of the cervical spine, 3 cm inferior to the lower border of the occipital protuberance. The second injection site was 1.5 cm superiorly and laterally to the first injection site.  -Trapezius muscle injection was performed at 3 sites, bilaterally. The first injection site was in the upper trapezius muscle halfway between the inflection point of the neck, and the acromion.  The second injection site was one half way between the acromion and the first injection site. The third injection was done between the first injection site and the inflection point of the neck.   Will return for repeat injection in 3 months.   A 200 unit sof Botox was used, any Botox not injected was wasted. The patient tolerated the procedure well, there were no complications of the above procedure.   Naomie Dean, MD  [facility] [address] [place], [place] [address]  Phone [phone] Fax [phone]

## 2018-11-30 NOTE — Progress Notes (Addendum)
Consent Form Botulism Toxin Injection For Chronic Migraine  A total of 25 minutes was spent face-to-face with this patient. Over half this time was spent on counseling patient on the  1. Chronic migraine without aura without status migrainosus, not intractable   2. Neck pain    diagnosis and different diagnostic and therapeutic options, counseling and coordination of care, risks ans benefits of management, compliance, or risk factor reduction and education and performing botox injection.    Reviewed orally with patient, additionally signature is on file:  Botulism toxin has been approved by the Federal drug administration for treatment of chronic migraine. Botulism toxin does not cure chronic migraine and it may not be effective in some patients.  The administration of botulism toxin is accomplished by injecting a small amount of toxin into the muscles of the neck and head. Dosage must be titrated for each individual. Any benefits resulting from botulism toxin tend to wear off after 3 months with a repeat injection required if benefit is to be maintained. Injections are usually done every 3-4 months with maximum effect peak achieved by about 2 or 3 weeks. Botulism toxin is expensive and you should be sure of what costs you will incur resulting from the injection.  The side effects of botulism toxin use for chronic migraine may include:   -Transient, and usually mild, facial weakness with facial injections  -Transient, and usually mild, head or neck weakness with head/neck injections  -Reduction or loss of forehead facial animation due to forehead muscle weakness  -Eyelid drooping  -Dry eye  -Pain at the site of injection or bruising at the site of injection  -Double vision  -Potential unknown long term risks  Contraindications: You should not have Botox if you are pregnant, nursing, allergic to albumin, have an infection, skin condition, or muscle weakness at the site of the injection,  or have myasthenia gravis, Lambert-Eaton syndrome, or ALS.  It is also possible that as with any injection, there may be an allergic reaction or no effect from the medication. Reduced effectiveness after repeated injections is sometimes seen and rarely infection at the injection site may occur. All care will be taken to prevent these side effects. If therapy is given over a long time, atrophy and wasting in the muscle injected may occur. Occasionally the patient's become refractory to treatment because they develop antibodies to the toxin. In this event, therapy needs to be modified.  I have read the above information and consent to the administration of botulism toxin.    BOTOX PROCEDURE NOTE FOR MIGRAINE HEADACHE    Contraindications and precautions discussed with patient(above). Aseptic procedure was observed and patient tolerated procedure. Procedure performed by Dr. Artemio Aly  The condition has existed for more than 6 months, and pt does not have a diagnosis of ALS, Myasthenia Gravis or Lambert-Eaton Syndrome.  Risks and benefits of injections discussed and pt agrees to proceed with the procedure.  Written consent obtained  These injections are medically necessary. Pt  receives good benefits from these injections. These injections do not cause sedations or hallucinations which the oral therapies may cause.  Description of procedure:  The patient was placed in a sitting position. The standard protocol was used for Botox as follows, with 5 units of Botox injected at each site:   -Procerus muscle, midline injection  -Corrugator muscle, bilateral injection  -Frontalis muscle, bilateral injection, with 2 sites each side, medial injection was performed in the upper one third of the frontalis  muscle, in the region vertical from the medial inferior edge of the superior orbital rim. The lateral injection was again in the upper one third of the forehead vertically above the lateral limbus of  the cornea, 1.5 cm lateral to the medial injection site.  - Levator Scapulae: 5 units bilaterally  -Temporalis muscle injection, 5 sites, bilaterally. The first injection was 3 cm above the tragus of the ear, second injection site was 1.5 cm to 3 cm up from the first injection site in line with the tragus of the ear. The third injection site was 1.5-3 cm forward between the first 2 injection sites. The fourth injection site was 1.5 cm posterior to the second injection site. 5th site laterally in the temporalis  muscleat the level of the outer canthus.  - Patient feels her clenching is a trigger for headaches. +5 units masseter bilaterally   - Patient feels the migraines are centered around the eyes +5 units bilaterally at the outer canthus in the orbicularis occuli  -Occipitalis muscle injection, 3 sites, bilaterally. The first injection was done one half way between the occipital protuberance and the tip of the mastoid process behind the ear. The second injection site was done lateral and superior to the first, 1 fingerbreadth from the first injection. The third injection site was 1 fingerbreadth superiorly and medially from the first injection site.  -Cervical paraspinal muscle injection, 2 sites, bilateral knee first injection site was 1 cm from the midline of the cervical spine, 3 cm inferior to the lower border of the occipital protuberance. The second injection site was 1.5 cm superiorly and laterally to the first injection site.  -Trapezius muscle injection was performed at 3 sites, bilaterally. The first injection site was in the upper trapezius muscle halfway between the inflection point of the neck, and the acromion. The second injection site was one half way between the acromion and the first injection site. The third injection was done between the first injection site and the inflection point of the neck.   Will return for repeat injection in 3 months.   A 200 unit sof Botox was used,  any Botox not injected was wasted. The patient tolerated the procedure well, there were no complications of the above procedure.

## 2018-11-30 NOTE — Addendum Note (Signed)
Addended by: Naomie Dean B on: 11/30/2018 04:22 PM   Modules accepted: Level of Service

## 2018-11-30 NOTE — Progress Notes (Signed)
Botox- 200 units x 1 vial Lot: [id number] Expiration: 06/2020 NDC: 3552-1747-15  Bacteriostatic 0.9% Sodium Chloride- 9mL total Lot: [id number] Expiration: 05/18/2019 NDC: 2897-9150-41  Dx: J64.383 Sample used today

## 2018-12-06 ENCOUNTER — Other Ambulatory Visit: Payer: Self-pay | Admitting: Neurology

## 2018-12-09 ENCOUNTER — Ambulatory Visit: Payer: Commercial Managed Care - PPO | Admitting: Neurology

## 2018-12-19 ENCOUNTER — Other Ambulatory Visit: Payer: Self-pay | Admitting: Neurology

## 2019-02-02 ENCOUNTER — Telehealth: Payer: Self-pay | Admitting: Neurology

## 2019-02-02 NOTE — Telephone Encounter (Signed)
Spoke with pt. This migraine has been coming and going for 10 days. No new or concerning symptoms. Pt offered infusion. She will come about 12 pm. She agreed to the plan to administer what she received last time, Depacon and Toradol. She stated this time she has a driver and asked for the nausea medication. She agreed to Compazine. Confirmed pt's allergies. Pt understands mask requirement and check-in process. She passed COVID19 screening questions.   Orders written and signed by Dr. Jaynee Eagles: Depacon 1 gram IV x 1  Toradol 30 mg IV x 1 Compazine 10 mg IV x 1  Infusion order and insurance information taken to [employer] in infusion.

## 2019-02-02 NOTE — Telephone Encounter (Signed)
Pt said she has had a 10 day migraine and she would like to be scheduled for an infusion.  Pt states she was told by Destiny Ellis in infusions to request an infusion thru RN for Dr Jaynee Eagles, please call

## 2019-03-02 ENCOUNTER — Telehealth: Payer: Self-pay | Admitting: Neurology

## 2019-03-02 NOTE — Telephone Encounter (Signed)
I called [facility] and they needed a new rx. I gave new rx and it will be processed within 24 hours. A PA will need to be done through [facility] once the rx is finalized. Please call them at [phone]. DW

## 2019-03-06 NOTE — Telephone Encounter (Signed)
Destiny Ellis [phone] they are going to fax PA sheet and they want me attach notes and sent back [facility] .

## 2019-03-07 NOTE — Telephone Encounter (Signed)
Approved [facility] 03/07/2019 - 03/06/2020  [facility] [id number].  [place] TBD 03/09/2019 Jayme Cloud

## 2019-03-09 NOTE — Telephone Encounter (Signed)
Medication is here

## 2019-03-14 ENCOUNTER — Encounter: Payer: Self-pay | Admitting: Neurology

## 2019-03-14 ENCOUNTER — Other Ambulatory Visit: Payer: Self-pay

## 2019-03-14 ENCOUNTER — Ambulatory Visit (INDEPENDENT_AMBULATORY_CARE_PROVIDER_SITE_OTHER): Payer: Commercial Managed Care - PPO | Admitting: Neurology

## 2019-03-14 DIAGNOSIS — G43709 Chronic migraine without aura, not intractable, without status migrainosus: Secondary | ICD-10-CM | POA: Diagnosis not present

## 2019-03-14 MED ORDER — GABAPENTIN 100 MG PO CAPS: 100.0000 mg | ORAL_CAPSULE | Freq: Three times a day (TID) | ORAL | 6 refills | Status: DC

## 2019-03-14 NOTE — Progress Notes (Signed)
Consent Form Botulism Toxin Injection For Chronic Migraine  >50% improvement in migraine frequency. Start Gabapentin for migraines, may also help with her other nerve pain and anxiety.   Reviewed orally with patient, additionally signature is on file:  Botulism toxin has been approved by the Federal drug administration for treatment of chronic migraine. Botulism toxin does not cure chronic migraine and it may not be effective in some patients.  The administration of botulism toxin is accomplished by injecting a small amount of toxin into the muscles of the neck and head. Dosage must be titrated for each individual. Any benefits resulting from botulism toxin tend to wear off after 3 months with a repeat injection required if benefit is to be maintained. Injections are usually done every 3-4 months with maximum effect peak achieved by about 2 or 3 weeks. Botulism toxin is expensive and you should be sure of what costs you will incur resulting from the injection.  The side effects of botulism toxin use for chronic migraine may include:   -Transient, and usually mild, facial weakness with facial injections  -Transient, and usually mild, head or neck weakness with head/neck injections  -Reduction or loss of forehead facial animation due to forehead muscle weakness  -Eyelid drooping  -Dry eye  -Pain at the site of injection or bruising at the site of injection  -Double vision  -Potential unknown long term risks  Contraindications: You should not have Botox if you are pregnant, nursing, allergic to albumin, have an infection, skin condition, or muscle weakness at the site of the injection, or have myasthenia gravis, Lambert-Eaton syndrome, or ALS.  It is also possible that as with any injection, there may be an allergic reaction or no effect from the medication. Reduced effectiveness after repeated injections is sometimes seen and rarely infection at the injection site may occur. All care will  be taken to prevent these side effects. If therapy is given over a long time, atrophy and wasting in the muscle injected may occur. Occasionally the patient's become refractory to treatment because they develop antibodies to the toxin. In this event, therapy needs to be modified.  I have read the above information and consent to the administration of botulism toxin.    BOTOX PROCEDURE NOTE FOR MIGRAINE HEADACHE    Contraindications and precautions discussed with patient(above). Aseptic procedure was observed and patient tolerated procedure. Procedure performed by Dr. Georgia Dom  The condition has existed for more than 6 months, and pt does not have a diagnosis of ALS, Myasthenia Gravis or Lambert-Eaton Syndrome.  Risks and benefits of injections discussed and pt agrees to proceed with the procedure.  Written consent obtained  These injections are medically necessary. Pt  receives good benefits from these injections. These injections do not cause sedations or hallucinations which the oral therapies may cause.  Description of procedure:  The patient was placed in a sitting position. The standard protocol was used for Botox as follows, with 5 units of Botox injected at each site:   -Procerus muscle, midline injection  -Corrugator muscle, bilateral injection  -Frontalis muscle, bilateral injection, with 2 sites each side, medial injection was performed in the upper one third of the frontalis muscle, in the region vertical from the medial inferior edge of the superior orbital rim. The lateral injection was again in the upper one third of the forehead vertically above the lateral limbus of the cornea, 1.5 cm lateral to the medial injection site.  - Levator Scapulae: 5 units bilaterally  -  Temporalis muscle injection, 5 sites, bilaterally. The first injection was 3 cm above the tragus of the ear, second injection site was 1.5 cm to 3 cm up from the first injection site in line with the tragus of  the ear. The third injection site was 1.5-3 cm forward between the first 2 injection sites. The fourth injection site was 1.5 cm posterior to the second injection site. 5th site laterally in the temporalis  muscleat the level of the outer canthus.  - Patient feels her clenching is a trigger for headaches. +5 units masseter bilaterally   - Patient feels the migraines are centered around the eyes +5 units bilaterally at the outer canthus in the orbicularis occuli  -Occipitalis muscle injection, 3 sites, bilaterally. The first injection was done one half way between the occipital protuberance and the tip of the mastoid process behind the ear. The second injection site was done lateral and superior to the first, 1 fingerbreadth from the first injection. The third injection site was 1 fingerbreadth superiorly and medially from the first injection site.  -Cervical paraspinal muscle injection, 2 sites, bilateral knee first injection site was 1 cm from the midline of the cervical spine, 3 cm inferior to the lower border of the occipital protuberance. The second injection site was 1.5 cm superiorly and laterally to the first injection site.  -Trapezius muscle injection was performed at 3 sites, bilaterally. The first injection site was in the upper trapezius muscle halfway between the inflection point of the neck, and the acromion. The second injection site was one half way between the acromion and the first injection site. The third injection was done between the first injection site and the inflection point of the neck.   Will return for repeat injection in 3 months.   A 200 unit sof Botox was used, any Botox not injected was wasted. The patient tolerated the procedure well, there were no complications of the above procedure.

## 2019-03-14 NOTE — Patient Instructions (Signed)
Start Gabapentin  Gabapentin capsules or tablets What is this medicine? GABAPENTIN (GA ba pen tin) is used to control seizures in certain types of epilepsy. It is also used to treat certain types of nerve pain, migraines and anxiety.  This medicine may be used for other purposes; ask your health care provider or pharmacist if you have questions. COMMON BRAND NAME(S): Active-PAC with Gabapentin, Gabarone, Neurontin What should I tell my health care provider before I take this medicine? They need to know if you have any of these conditions:  history of drug abuse or alcohol abuse problem  kidney disease  lung or breathing disease  suicidal thoughts, plans, or attempt; a previous suicide attempt by you or a family member  an unusual or allergic reaction to gabapentin, other medicines, foods, dyes, or preservatives  pregnant or trying to get pregnant  breast-feeding How should I use this medicine? Take this medicine by mouth with a glass of water. Follow the directions on the prescription label. You can take it with or without food. If it upsets your stomach, take it with food. Take your medicine at regular intervals. Do not take it more often than directed. Do not stop taking except on your doctor's advice. If you are directed to break the 600 or 800 mg tablets in half as part of your dose, the extra half tablet should be used for the next dose. If you have not used the extra half tablet within 28 days, it should be thrown away. A special MedGuide will be given to you by the pharmacist with each prescription and refill. Be sure to read this information carefully each time. Talk to your pediatrician regarding the use of this medicine in children. While this drug may be prescribed for children as young as 3 years for selected conditions, precautions do apply. Overdosage: If you think you have taken too much of this medicine contact a poison control center or emergency room at once. NOTE: This  medicine is only for you. Do not share this medicine with others. What if I miss a dose? If you miss a dose, take it as soon as you can. If it is almost time for your next dose, take only that dose. Do not take double or extra doses. What may interact with this medicine? This medicine may interact with the following medications:  alcohol  antihistamines for allergy, cough, and cold  certain medicines for anxiety or sleep  certain medicines for depression like amitriptyline, fluoxetine, sertraline  certain medicines for seizures like phenobarbital, primidone  certain medicines for stomach problems  general anesthetics like halothane, isoflurane, methoxyflurane, propofol  local anesthetics like lidocaine, pramoxine, tetracaine  medicines that relax muscles for surgery  narcotic medicines for pain  phenothiazines like chlorpromazine, mesoridazine, prochlorperazine, thioridazine This list may not describe all possible interactions. Give your health care provider a list of all the medicines, herbs, non-prescription drugs, or dietary supplements you use. Also tell them if you smoke, drink alcohol, or use illegal drugs. Some items may interact with your medicine. What should I watch for while using this medicine? Visit your doctor or health care provider for regular checks on your progress. You may want to keep a record at home of how you feel your condition is responding to treatment. You may want to share this information with your doctor or health care provider at each visit. You should contact your doctor or health care provider if your seizures get worse or if you have any new  types of seizures. Do not stop taking this medicine or any of your seizure medicines unless instructed by your doctor or health care provider. Stopping your medicine suddenly can increase your seizures or their severity. This medicine may cause serious skin reactions. They can happen weeks to months after starting  the medicine. Contact your health care provider right away if you notice fevers or flu-like symptoms with a rash. The rash may be red or purple and then turn into blisters or peeling of the skin. Or, you might notice a red rash with swelling of the face, lips or lymph nodes in your neck or under your arms. Wear a medical identification bracelet or chain if you are taking this medicine for seizures, and carry a card that lists all your medications. You may get drowsy, dizzy, or have blurred vision. Do not drive, use machinery, or do anything that needs mental alertness until you know how this medicine affects you. To reduce dizzy or fainting spells, do not sit or stand up quickly, especially if you are an older patient. Alcohol can increase drowsiness and dizziness. Avoid alcoholic drinks. Your mouth may get dry. Chewing sugarless gum or sucking hard candy, and drinking plenty of water will help. The use of this medicine may increase the chance of suicidal thoughts or actions. Pay special attention to how you are responding while on this medicine. Any worsening of mood, or thoughts of suicide or dying should be reported to your health care provider right away. Women who become pregnant while using this medicine may enroll in the [place] Pregnancy Registry by calling [phone]. This registry collects information about the safety of antiepileptic drug use during pregnancy. What side effects may I notice from receiving this medicine? Side effects that you should report to your doctor or health care professional as soon as possible:  allergic reactions like skin rash, itching or hives, swelling of the face, lips, or tongue  breathing problems  rash, fever, and swollen lymph nodes  redness, blistering, peeling or loosening of the skin, including inside the mouth  suicidal thoughts, mood changes Side effects that usually do not require medical attention (report to your  doctor or health care professional if they continue or are bothersome):  dizziness  drowsiness  headache  nausea, vomiting  swelling of ankles, feet, hands  tiredness This list may not describe all possible side effects. Call your doctor for medical advice about side effects. You may report side effects to FDA at 1-800-FDA-1088. Where should I keep my medicine? Keep out of reach of children. This medicine may cause accidental overdose and death if it taken by other adults, children, or pets. Mix any unused medicine with a substance like cat litter or coffee grounds. Then throw the medicine away in a sealed container like a sealed bag or a coffee can with a lid. Do not use the medicine after the expiration date. Store at room temperature between 15 and 30 degrees C (59 and 86 degrees F). NOTE: This sheet is a summary. It may not cover all possible information. If you have questions about this medicine, talk to your doctor, pharmacist, or health care provider.  2020 [employer]vier/Gold Standard (2018-11-04 14:16:43)

## 2019-03-14 NOTE — Progress Notes (Signed)
Botox- 100 units x 2 vials Lot: [id number] Expiration: 09/2021 NDC: 0023-1145-01  Bacteriostatic 0.9% Sodium Chloride- 4mL total Lot: [id number] Expiration: 05/18/2019 NDC: 0409-1966-02  Dx: G43.709 S/P   

## 2019-04-26 ENCOUNTER — Other Ambulatory Visit: Payer: Self-pay | Admitting: Neurology

## 2019-04-27 ENCOUNTER — Telehealth: Payer: Self-pay | Admitting: Neurology

## 2019-04-27 NOTE — Telephone Encounter (Signed)
I called and scheduled the patient for their Botox injection but she did not answer and her VM was full. DW

## 2019-05-22 ENCOUNTER — Other Ambulatory Visit: Payer: Self-pay | Admitting: Neurology

## 2019-06-15 ENCOUNTER — Ambulatory Visit: Payer: Commercial Managed Care - PPO | Admitting: Neurology

## 2019-06-15 ENCOUNTER — Other Ambulatory Visit: Payer: Self-pay

## 2019-06-15 VITALS — Temp 97.6°F

## 2019-06-15 DIAGNOSIS — G43709 Chronic migraine without aura, not intractable, without status migrainosus: Secondary | ICD-10-CM | POA: Diagnosis not present

## 2019-06-15 NOTE — Progress Notes (Signed)
Botox- 100 units x 2 vials Lot: [id number] Expiration: 01/2022 NDC: 5465-0354-65  Bacteriostatic 0.9% Sodium Chloride- 36mL total Lot: [id number] Expiration: 02/15/2020 NDC: 1700-1749-44  Dx: H67.591 SP

## 2019-06-15 NOTE — Progress Notes (Addendum)
Consent Form Botulism Toxin Injection For Chronic Migraine  06/15/2019: >50% improvement in migraine frequency. Started Gabapentin for migraines, may also help with her other nerve pain and anxiety, she is doing extremely well on the Gabapentin  Reviewed orally with patient, additionally signature is on file:  Botulism toxin has been approved by the Federal drug administration for treatment of chronic migraine. Botulism toxin does not cure chronic migraine and it may not be effective in some patients.  The administration of botulism toxin is accomplished by injecting a small amount of toxin into the muscles of the neck and head. Dosage must be titrated for each individual. Any benefits resulting from botulism toxin tend to wear off after 3 months with a repeat injection required if benefit is to be maintained. Injections are usually done every 3-4 months with maximum effect peak achieved by about 2 or 3 weeks. Botulism toxin is expensive and you should be sure of what costs you will incur resulting from the injection.  The side effects of botulism toxin use for chronic migraine may include:   -Transient, and usually mild, facial weakness with facial injections  -Transient, and usually mild, head or neck weakness with head/neck injections  -Reduction or loss of forehead facial animation due to forehead muscle weakness  -Eyelid drooping  -Dry eye  -Pain at the site of injection or bruising at the site of injection  -Double vision  -Potential unknown long term risks  Contraindications: You should not have Botox if you are pregnant, nursing, allergic to albumin, have an infection, skin condition, or muscle weakness at the site of the injection, or have myasthenia gravis, Lambert-Eaton syndrome, or ALS.  It is also possible that as with any injection, there may be an allergic reaction or no effect from the medication. Reduced effectiveness after repeated injections is sometimes seen and rarely  infection at the injection site may occur. All care will be taken to prevent these side effects. If therapy is given over a long time, atrophy and wasting in the muscle injected may occur. Occasionally the patient's become refractory to treatment because they develop antibodies to the toxin. In this event, therapy needs to be modified.  I have read the above information and consent to the administration of botulism toxin.    BOTOX PROCEDURE NOTE FOR MIGRAINE HEADACHE    Contraindications and precautions discussed with patient(above). Aseptic procedure was observed and patient tolerated procedure. Procedure performed by Dr. Artemio Aly  The condition has existed for more than 6 months, and pt does not have a diagnosis of ALS, Myasthenia Gravis or Lambert-Eaton Syndrome.  Risks and benefits of injections discussed and pt agrees to proceed with the procedure.  Written consent obtained  These injections are medically necessary. Pt  receives good benefits from these injections. These injections do not cause sedations or hallucinations which the oral therapies may cause.  Description of procedure:  The patient was placed in a sitting position. The standard protocol was used for Botox as follows, with 5 units of Botox injected at each site:   -Procerus muscle, midline injection  -Corrugator muscle, bilateral injection  -Frontalis muscle, bilateral injection, with 2 sites each side, medial injection was performed in the upper one third of the frontalis muscle, in the region vertical from the medial inferior edge of the superior orbital rim. The lateral injection was again in the upper one third of the forehead vertically above the lateral limbus of the cornea, 1.5 cm lateral to the medial injection  site.  - Levator Scapulae: 5 units bilaterally  -Temporalis muscle injection, 5 sites, bilaterally. The first injection was 3 cm above the tragus of the ear, second injection site was 1.5 cm to 3 cm up  from the first injection site in line with the tragus of the ear. The third injection site was 1.5-3 cm forward between the first 2 injection sites. The fourth injection site was 1.5 cm posterior to the second injection site. 5th site laterally in the temporalis  muscleat the level of the outer canthus.  - Patient feels her clenching is a trigger for headaches. +5 units masseter bilaterally   - Patient feels the migraines are centered around the eyes +5 units bilaterally at the outer canthus in the orbicularis occuli  -Occipitalis muscle injection, 3 sites, bilaterally. The first injection was done one half way between the occipital protuberance and the tip of the mastoid process behind the ear. The second injection site was done lateral and superior to the first, 1 fingerbreadth from the first injection. The third injection site was 1 fingerbreadth superiorly and medially from the first injection site.  -Cervical paraspinal muscle injection, 2 sites, bilateral knee first injection site was 1 cm from the midline of the cervical spine, 3 cm inferior to the lower border of the occipital protuberance. The second injection site was 1.5 cm superiorly and laterally to the first injection site.  -Trapezius muscle injection was performed at 3 sites, bilaterally. The first injection site was in the upper trapezius muscle halfway between the inflection point of the neck, and the acromion. The second injection site was one half way between the acromion and the first injection site. The third injection was done between the first injection site and the inflection point of the neck.   Will return for repeat injection in 3 months.   A 200 unit sof Botox was used, any Botox not injected was wasted. The patient tolerated the procedure well, there were no complications of the above procedure.

## 2019-06-22 ENCOUNTER — Other Ambulatory Visit: Payer: Self-pay | Admitting: Neurology

## 2019-07-06 ENCOUNTER — Other Ambulatory Visit: Payer: Self-pay | Admitting: Neurology

## 2019-08-29 ENCOUNTER — Telehealth: Payer: Self-pay | Admitting: *Deleted

## 2019-08-29 NOTE — Telephone Encounter (Signed)
Completed Ajovy PA on CMM. Awaiting [facility] [employer] determination. Key: [id number].

## 2019-08-30 NOTE — Telephone Encounter (Signed)
Received approval from [employer]. Ajovy approved from 08/29/19-08/28/20. I faxed this to the pt's pharmacy. Received a receipt of confirmation.

## 2019-09-19 ENCOUNTER — Other Ambulatory Visit: Payer: Self-pay

## 2019-09-19 ENCOUNTER — Ambulatory Visit (INDEPENDENT_AMBULATORY_CARE_PROVIDER_SITE_OTHER): Payer: 59 | Admitting: Neurology

## 2019-09-19 VITALS — Temp 97.5°F

## 2019-09-19 DIAGNOSIS — G43709 Chronic migraine without aura, not intractable, without status migrainosus: Secondary | ICD-10-CM

## 2019-09-19 MED ORDER — BACLOFEN 10 MG PO TABS: 10.0000 mg | ORAL_TABLET | Freq: Three times a day (TID) | ORAL | 6 refills | Status: DC

## 2019-09-19 NOTE — Progress Notes (Signed)
Botox- 100 units x 2 vials Lot: [id number] Expiration: 05/2022 NDC: 6226-3335-45  Bacteriostatic 0.9% Sodium Chloride- 72mL total Lot: [id number] Expiration: 11/16/2019 NDC: 9373-4287-68  Dx: T15.726 S/P

## 2019-09-19 NOTE — Progress Notes (Signed)
Consent Form Botulism Toxin Injection For Chronic Migraine  09/19/2019: : >60% improvement in migraine frequency. Started Gabapentin for migraines, may also help with her other nerve pain and anxiety, she is doing extremely well on the Gabapentin. Also will try Baclofen at bedtime.   Meds ordered this encounter  Medications  . baclofen (LIORESAL) 10 MG tablet    Sig: Take 1 tablet (10 mg total) by mouth 3 (three) times daily.    Dispense:  30 each    Refill:  6     Reviewed orally with patient, additionally signature is on file:  Botulism toxin has been approved by the Federal drug administration for treatment of chronic migraine. Botulism toxin does not cure chronic migraine and it may not be effective in some patients.  The administration of botulism toxin is accomplished by injecting a small amount of toxin into the muscles of the neck and head. Dosage must be titrated for each individual. Any benefits resulting from botulism toxin tend to wear off after 3 months with a repeat injection required if benefit is to be maintained. Injections are usually done every 3-4 months with maximum effect peak achieved by about 2 or 3 weeks. Botulism toxin is expensive and you should be sure of what costs you will incur resulting from the injection.  The side effects of botulism toxin use for chronic migraine may include:   -Transient, and usually mild, facial weakness with facial injections  -Transient, and usually mild, head or neck weakness with head/neck injections  -Reduction or loss of forehead facial animation due to forehead muscle weakness  -Eyelid drooping  -Dry eye  -Pain at the site of injection or bruising at the site of injection  -Double vision  -Potential unknown long term risks  Contraindications: You should not have Botox if you are pregnant, nursing, allergic to albumin, have an infection, skin condition, or muscle weakness at the site of the injection, or have myasthenia  gravis, Lambert-Eaton syndrome, or ALS.  It is also possible that as with any injection, there may be an allergic reaction or no effect from the medication. Reduced effectiveness after repeated injections is sometimes seen and rarely infection at the injection site may occur. All care will be taken to prevent these side effects. If therapy is given over a long time, atrophy and wasting in the muscle injected may occur. Occasionally the patient's become refractory to treatment because they develop antibodies to the toxin. In this event, therapy needs to be modified.  I have read the above information and consent to the administration of botulism toxin.    BOTOX PROCEDURE NOTE FOR MIGRAINE HEADACHE    Contraindications and precautions discussed with patient(above). Aseptic procedure was observed and patient tolerated procedure. Procedure performed by Dr. Georgia Dom  The condition has existed for more than 6 months, and pt does not have a diagnosis of ALS, Myasthenia Gravis or Lambert-Eaton Syndrome.  Risks and benefits of injections discussed and pt agrees to proceed with the procedure.  Written consent obtained  These injections are medically necessary. Pt  receives good benefits from these injections. These injections do not cause sedations or hallucinations which the oral therapies may cause.  Description of procedure:  The patient was placed in a sitting position. The standard protocol was used for Botox as follows, with 5 units of Botox injected at each site:   -Procerus muscle, midline injection  -Corrugator muscle, bilateral injection  -Frontalis muscle, bilateral injection, with 2 sites each side, medial  injection was performed in the upper one third of the frontalis muscle, in the region vertical from the medial inferior edge of the superior orbital rim. The lateral injection was again in the upper one third of the forehead vertically above the lateral limbus of the cornea, 1.5 cm  lateral to the medial injection site.  - Levator Scapulae: 5 units bilaterally  -Temporalis muscle injection, 5 sites, bilaterally. The first injection was 3 cm above the tragus of the ear, second injection site was 1.5 cm to 3 cm up from the first injection site in line with the tragus of the ear. The third injection site was 1.5-3 cm forward between the first 2 injection sites. The fourth injection site was 1.5 cm posterior to the second injection site. 5th site laterally in the temporalis  muscleat the level of the outer canthus.  - Patient feels her clenching is a trigger for headaches. +5 units masseter bilaterally   - Patient feels the migraines are centered around the eyes +5 units bilaterally at the outer canthus in the orbicularis occuli  -Occipitalis muscle injection, 3 sites, bilaterally. The first injection was done one half way between the occipital protuberance and the tip of the mastoid process behind the ear. The second injection site was done lateral and superior to the first, 1 fingerbreadth from the first injection. The third injection site was 1 fingerbreadth superiorly and medially from the first injection site.  -Cervical paraspinal muscle injection, 2 sites, bilateral knee first injection site was 1 cm from the midline of the cervical spine, 3 cm inferior to the lower border of the occipital protuberance. The second injection site was 1.5 cm superiorly and laterally to the first injection site.  -Trapezius muscle injection was performed at 3 sites, bilaterally. The first injection site was in the upper trapezius muscle halfway between the inflection point of the neck, and the acromion. The second injection site was one half way between the acromion and the first injection site. The third injection was done between the first injection site and the inflection point of the neck.   Will return for repeat injection in 3 months.   A 200 unit sof Botox was used, any Botox not  injected was wasted. The patient tolerated the procedure well, there were no complications of the above procedure.

## 2019-10-17 ENCOUNTER — Other Ambulatory Visit: Payer: Self-pay | Admitting: Neurology

## 2019-11-07 ENCOUNTER — Other Ambulatory Visit: Payer: Self-pay | Admitting: Neurology

## 2019-11-26 ENCOUNTER — Other Ambulatory Visit: Payer: Self-pay | Admitting: Neurology

## 2019-12-05 ENCOUNTER — Telehealth: Payer: Self-pay | Admitting: *Deleted

## 2019-12-05 NOTE — Telephone Encounter (Signed)
I called [facility] [phone] and spoke to [place].  She states that [id number] and 13086 are valid and billable.  V7846 will require PA.  Fax completed PA form and clinical notes to [phone].  Ref# for call is [id number].  I called again to verify if eligible for B/B and spoke to Rem.  She states it is eligible for B/B Ref# for call is [id number].  I filled out PA form and faxed with clinical notes.

## 2019-12-06 NOTE — Telephone Encounter (Signed)
PA Approval for Botox received via fax.  The PA Case# [id number] Valid from 12/06/2019-06/03/2020

## 2019-12-14 ENCOUNTER — Telehealth: Payer: Self-pay | Admitting: Neurology

## 2019-12-14 MED ORDER — METHYLPREDNISOLONE 4 MG PO TBPK: ORAL_TABLET | ORAL | 0 refills | Status: DC

## 2019-12-14 NOTE — Addendum Note (Signed)
Addended by: Bertram Savin on: 12/14/2019 05:18 PM   Modules accepted: Orders

## 2019-12-14 NOTE — Telephone Encounter (Signed)
Spoke with pt. She said the stiffness started around lunchtime yesterday. It is hard to touch ear to shoulder, chin to chest. She said her arm has gone numb and hard to hold things. She says she does have pain in her neck. She denies fever. She said Tues she had a dizzy spell in [employer]. She has pain up and down her spine like referred pain. She notes in the past she had vertigo/difficulty going up and down stairs and had falls. She has taken Baclofen & Tizanidine, more than in the past, and they only make her sleepy but are not helping her neck. Pt scared because she lives alone.   I spoke with Dr. Lucia Gaskins who is willing to order a medrol dose pack for patient to try but states that she should go to the ED if she is not better over the weekend.   I called pt back and we discussed. She is willing to try a medrol dose pack. We discussed the instructions and common side effects. Her questions were answered. She is aware to proceed to the ED if she does not get better over the weekend. She was also advised to call on-call MD for advice over weekend if needed. Pt has a botox appt next Tuesday. She verbalized appreciation for the call.   Medrol dose pack sent to [facility].

## 2019-12-14 NOTE — Telephone Encounter (Signed)
See phone note. I confirmed with pt that she is not allergic to prednisone. She will try medrol dose pack per Dr. Lucia Gaskins and will proceed to ER if not improving over the weekend.

## 2019-12-14 NOTE — Telephone Encounter (Addendum)
Pt is asking for a call from RN.  Pt states since yesterday after she has not been able to move her neck, it is stuck.  Pt states she is only able to look forward.  Please call, pt states she has tried everything she can think of [id number].  Pt asked that it also be mentioned that on Tuesday she had a very bad dizzy spell.

## 2019-12-17 ENCOUNTER — Other Ambulatory Visit: Payer: Self-pay | Admitting: Neurology

## 2019-12-19 ENCOUNTER — Ambulatory Visit: Payer: Self-pay | Admitting: Neurology

## 2019-12-19 ENCOUNTER — Other Ambulatory Visit: Payer: Self-pay

## 2019-12-19 ENCOUNTER — Ambulatory Visit: Payer: 59 | Admitting: Neurology

## 2019-12-19 DIAGNOSIS — G43709 Chronic migraine without aura, not intractable, without status migrainosus: Secondary | ICD-10-CM

## 2019-12-19 MED ORDER — CLONAZEPAM 0.5 MG PO TABS: 0.5000 mg | ORAL_TABLET | Freq: Every day | ORAL | 4 refills | Status: DC

## 2019-12-19 MED ORDER — BUPROPION HCL ER (XL) 150 MG PO TB24: 150.0000 mg | ORAL_TABLET | ORAL | 6 refills | Status: DC

## 2019-12-19 NOTE — Patient Instructions (Addendum)
Bupropion extended-release tablets (Depression/Mood Disorders) What is this medicine? BUPROPION (byoo PROE pee on) is used to treat depression. This medicine may be used for other purposes; ask your health care provider or pharmacist if you have questions. COMMON BRAND NAME(S): Aplenzin, Budeprion XL, Forfivo XL, Wellbutrin XL What should I tell my health care provider before I take this medicine? They need to know if you have any of these conditions:  an eating disorder, such as anorexia or bulimia  bipolar disorder or psychosis  diabetes or high blood sugar, treated with medication  glaucoma  head injury or brain tumor  heart disease, previous heart attack, or irregular heart beat  high blood pressure  kidney or liver disease  seizures (convulsions)  suicidal thoughts or a previous suicide attempt  Tourette's syndrome  weight loss  an unusual or allergic reaction to bupropion, other medicines, foods, dyes, or preservatives  breast-feeding  pregnant or trying to become pregnant How should I use this medicine? Take this medicine by mouth with a glass of water. Follow the directions on the prescription label. You can take it with or without food. If it upsets your stomach, take it with food. Do not crush, chew, or cut these tablets. This medicine is taken once daily at the same time each day. Do not take your medicine more often than directed. Do not stop taking this medicine suddenly except upon the advice of your doctor. Stopping this medicine too quickly may cause serious side effects or your condition may worsen. A special MedGuide will be given to you by the pharmacist with each prescription and refill. Be sure to read this information carefully each time. Talk to your pediatrician regarding the use of this medicine in children. Special care may be needed. Overdosage: If you think you have taken too much of this medicine contact a poison control center or emergency room  at once. NOTE: This medicine is only for you. Do not share this medicine with others. What if I miss a dose? If you miss a dose, skip the missed dose and take your next tablet at the regular time. Do not take double or extra doses. What may interact with this medicine? Do not take this medicine with any of the following medications:  linezolid  MAOIs like Azilect, Carbex, Eldepryl, Marplan, Nardil, and Parnate  methylene blue (injected into a vein)  other medicines that contain bupropion like Zyban This medicine may also interact with the following medications:  alcohol  certain medicines for anxiety or sleep  certain medicines for blood pressure like metoprolol, propranolol  certain medicines for depression or psychotic disturbances  certain medicines for HIV or AIDS like efavirenz, lopinavir, nelfinavir, ritonavir  certain medicines for irregular heart beat like propafenone, flecainide  certain medicines for Parkinson's disease like amantadine, levodopa  certain medicines for seizures like carbamazepine, phenytoin, phenobarbital  cimetidine  clopidogrel  cyclophosphamide  digoxin  furazolidone  isoniazid  nicotine  orphenadrine  procarbazine  steroid medicines like prednisone or cortisone  stimulant medicines for attention disorders, weight loss, or to stay awake  tamoxifen  theophylline  thiotepa  ticlopidine  tramadol  warfarin This list may not describe all possible interactions. Give your health care provider a list of all the medicines, herbs, non-prescription drugs, or dietary supplements you use. Also tell them if you smoke, drink alcohol, or use illegal drugs. Some items may interact with your medicine. What should I watch for while using this medicine? Tell your doctor if your symptoms do   not get better or if they get worse. Visit your doctor or healthcare provider for regular checks on your progress. Because it may take several weeks to  see the full effects of this medicine, it is important to continue your treatment as prescribed by your doctor. This medicine may cause serious skin reactions. They can happen weeks to months after starting the medicine. Contact your healthcare provider right away if you notice fevers or flu-like symptoms with a rash. The rash may be red or purple and then turn into blisters or peeling of the skin. Or, you might notice a red rash with swelling of the face, lips or lymph nodes in your neck or under your arms. Patients and their families should watch out for new or worsening thoughts of suicide or depression. Also watch out for sudden changes in feelings such as feeling anxious, agitated, panicky, irritable, hostile, aggressive, impulsive, severely restless, overly excited and hyperactive, or not being able to sleep. If this happens, especially at the beginning of treatment or after a change in dose, call your healthcare provider. Avoid alcoholic drinks while taking this medicine. Drinking large amounts of alcoholic beverages, using sleeping or anxiety medicines, or quickly stopping the use of these agents while taking this medicine may increase your risk for a seizure. Do not drive or use heavy machinery until you know how this medicine affects you. This medicine can impair your ability to perform these tasks. Do not take this medicine close to bedtime. It may prevent you from sleeping. Your mouth may get dry. Chewing sugarless gum or sucking hard candy, and drinking plenty of water may help. Contact your doctor if the problem does not go away or is severe. The tablet shell for some brands of this medicine does not dissolve. This is normal. The tablet shell may appear whole in the stool. This is not a cause for concern. What side effects may I notice from receiving this medicine? Side effects that you should report to your doctor or health care professional as soon as possible:  allergic reactions like  skin rash, itching or hives, swelling of the face, lips, or tongue  breathing problems  changes in vision  confusion  elevated mood, decreased need for sleep, racing thoughts, impulsive behavior  fast or irregular heartbeat  hallucinations, loss of contact with reality  increased blood pressure  rash, fever, and swollen lymph nodes  redness, blistering, peeling or loosening of the skin, including inside the mouth  seizures  suicidal thoughts or other mood changes  unusually weak or tired  vomiting Side effects that usually do not require medical attention (report to your doctor or health care professional if they continue or are bothersome):  constipation  headache  loss of appetite  nausea  tremors  weight loss This list may not describe all possible side effects. Call your doctor for medical advice about side effects. You may report side effects to FDA at 1-800-FDA-1088. Where should I keep my medicine? Keep out of the reach of children. Store at room temperature between 15 and 30 degrees C (59 and 86 degrees F). Throw away any unused medicine after the expiration date. NOTE: This sheet is a summary. It may not cover all possible information. If you have questions about this medicine, talk to your doctor, pharmacist, or health care provider.  2020 Elsevier/Gold Standard (2018-10-27 13:45:31) Clonazepam tablets What is this medicine? CLONAZEPAM (kloe NA ze pam) is a benzodiazepine. It is used to treat certain types of seizures.  It is also used to treat panic disorder. This medicine may be used for other purposes; ask your health care provider or pharmacist if you have questions. COMMON BRAND NAME(S): Ceberclon, Klonopin What should I tell my health care provider before I take this medicine? They need to know if you have any of these conditions:  an alcohol or drug abuse problem  bipolar disorder, depression, psychosis or other mental health  condition  glaucoma  kidney or liver disease  lung or breathing disease  myasthenia gravis  Parkinson's disease  porphyria  seizures or a history of seizures  suicidal thoughts  an unusual or allergic reaction to clonazepam, other benzodiazepines, foods, dyes, or preservatives  pregnant or trying to get pregnant  breast-feeding How should I use this medicine? Take this medicine by mouth with a glass of water. Follow the directions on the prescription label. If it upsets your stomach, take it with food or milk. Take your medicine at regular intervals. Do not take it more often than directed. Do not stop taking or change the dose except on the advice of your doctor or health care professional. A special MedGuide will be given to you by the pharmacist with each prescription and refill. Be sure to read this information carefully each time. Talk to your pediatrician regarding the use of this medicine in children. Special care may be needed. Overdosage: If you think you have taken too much of this medicine contact a poison control center or emergency room at once. NOTE: This medicine is only for you. Do not share this medicine with others. What if I miss a dose? If you miss a dose, take it as soon as you can. If it is almost time for your next dose, take only that dose. Do not take double or extra doses. What may interact with this medicine? Do not take this medication with any of the following medicines:  narcotic medicines for cough  sodium oxybate This medicine may also interact with the following medications:  alcohol  antihistamines for allergy, cough and cold  antiviral medicines for HIV or AIDS  certain medicines for anxiety or sleep  certain medicines for depression, like amitriptyline, fluoxetine, sertraline  certain medicines for fungal infections like ketoconazole and itraconazole  certain medicines for seizures like carbamazepine, phenobarbital, phenytoin,  primidone  general anesthetics like halothane, isoflurane, methoxyflurane, propofol  local anesthetics like lidocaine, pramoxine, tetracaine  medicines that relax muscles for surgery  narcotic medicines for pain  phenothiazines like chlorpromazine, mesoridazine, prochlorperazine, thioridazine This list may not describe all possible interactions. Give your health care provider a list of all the medicines, herbs, non-prescription drugs, or dietary supplements you use. Also tell them if you smoke, drink alcohol, or use illegal drugs. Some items may interact with your medicine. What should I watch for while using this medicine? Tell your doctor or health care professional if your symptoms do not start to get better or if they get worse. Do not stop taking except on your doctor's advice. You may develop a severe reaction. Your doctor will tell you how much medicine to take. You may get drowsy or dizzy. Do not drive, use machinery, or do anything that needs mental alertness until you know how this medicine affects you. To reduce the risk of dizzy and fainting spells, do not stand or sit up quickly, especially if you are an older patient. Alcohol may increase dizziness and drowsiness. Avoid alcoholic drinks. If you are taking another medicine that also causes drowsiness,  you may have more side effects. Give your health care provider a list of all medicines you use. Your doctor will tell you how much medicine to take. Do not take more medicine than directed. Call emergency for help if you have problems breathing or unusual sleepiness. The use of this medicine may increase the chance of suicidal thoughts or actions. Pay special attention to how you are responding while on this medicine. Any worsening of mood, or thoughts of suicide or dying should be reported to your health care professional right away. What side effects may I notice from receiving this medicine? Side effects that you should report to your  doctor or health care professional as soon as possible:  allergic reactions like skin rash, itching or hives, swelling of the face, lips, or tongue  breathing problems  confusion  loss of balance or coordination  signs and symptoms of low blood pressure like dizziness; feeling faint or lightheaded, falls; unusually weak or tired  suicidal thoughts or mood changes Side effects that usually do not require medical attention (report to your doctor or health care professional if they continue or are bothersome):  dizziness  headache  tiredness  upset stomach This list may not describe all possible side effects. Call your doctor for medical advice about side effects. You may report side effects to FDA at 1-800-FDA-1088. Where should I keep my medicine? Keep out of the reach of children. This medicine can be abused. Keep your medicine in a safe place to protect it from theft. Do not share this medicine with anyone. Selling or giving away this medicine is dangerous and against the law. This medicine may cause accidental overdose and death if taken by other adults, children, or pets. Mix any unused medicine with a substance like cat litter or coffee grounds. Then throw the medicine away in a sealed container like a sealed bag or a coffee can with a lid. Do not use the medicine after the expiration date. Store at room temperature between 15 and 30 degrees C (59 and 86 degrees F). Protect from light. Keep container tightly closed. NOTE: This sheet is a summary. It may not cover all possible information. If you have questions about this medicine, talk to your doctor, pharmacist, or health care provider.  2020 Elsevier/Gold Standard (2016-01-10 18:46:32)

## 2019-12-19 NOTE — Progress Notes (Signed)
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

## 2019-12-19 NOTE — Progress Notes (Signed)
Consent Form Botulism Toxin Injection For Chronic Migraine  12/19/2019: : >60% improvement in migraine frequency. Started Gabapentin for migraines, may also help with her other nerve pain and anxiety, she is doing extremely well on the Gabapentin. Also will try Baclofen at bedtime. She is doing better on zanaflex every night. Steroids helped her neck spasms. Gave her some clonazepam to see if that will help, she has dry needling at [facility] tomorrow, we also discussed Dr. Karena Addison as well as if she needs referral to a counselor I can ask Dr. Dorethea Clan for referral within her practice. Try Wellburin instead of effexor.   Meds ordered this encounter  Medications  . buPROPion (WELLBUTRIN XL) 150 MG 24 hr tablet    Sig: Take 1 tablet (150 mg total) by mouth every morning.    Dispense:  30 tablet    Refill:  6  . clonazePAM (KLONOPIN) 0.5 MG tablet    Sig: Take 1-2 tablets (0.5-1 mg total) by mouth at bedtime.    Dispense:  30 tablet    Refill:  4     Reviewed orally with patient, additionally signature is on file:  Botulism toxin has been approved by the Federal drug administration for treatment of chronic migraine. Botulism toxin does not cure chronic migraine and it may not be effective in some patients.  The administration of botulism toxin is accomplished by injecting a small amount of toxin into the muscles of the neck and head. Dosage must be titrated for each individual. Any benefits resulting from botulism toxin tend to wear off after 3 months with a repeat injection required if benefit is to be maintained. Injections are usually done every 3-4 months with maximum effect peak achieved by about 2 or 3 weeks. Botulism toxin is expensive and you should be sure of what costs you will incur resulting from the injection.  The side effects of botulism toxin use for chronic migraine may include:   -Transient, and usually mild, facial weakness with facial injections  -Transient, and usually mild,  head or neck weakness with head/neck injections  -Reduction or loss of forehead facial animation due to forehead muscle weakness  -Eyelid drooping  -Dry eye  -Pain at the site of injection or bruising at the site of injection  -Double vision  -Potential unknown long term risks  Contraindications: You should not have Botox if you are pregnant, nursing, allergic to albumin, have an infection, skin condition, or muscle weakness at the site of the injection, or have myasthenia gravis, Lambert-Eaton syndrome, or ALS.  It is also possible that as with any injection, there may be an allergic reaction or no effect from the medication. Reduced effectiveness after repeated injections is sometimes seen and rarely infection at the injection site may occur. All care will be taken to prevent these side effects. If therapy is given over a long time, atrophy and wasting in the muscle injected may occur. Occasionally the patient's become refractory to treatment because they develop antibodies to the toxin. In this event, therapy needs to be modified.  I have read the above information and consent to the administration of botulism toxin.    BOTOX PROCEDURE NOTE FOR MIGRAINE HEADACHE    Contraindications and precautions discussed with patient(above). Aseptic procedure was observed and patient tolerated procedure. Procedure performed by Dr. Georgia Dom  The condition has existed for more than 6 months, and pt does not have a diagnosis of ALS, Myasthenia Gravis or Lambert-Eaton Syndrome.  Risks and benefits  of injections discussed and pt agrees to proceed with the procedure.  Written consent obtained  These injections are medically necessary. Pt  receives good benefits from these injections. These injections do not cause sedations or hallucinations which the oral therapies may cause.  Description of procedure:  The patient was placed in a sitting position. The standard protocol was used for Botox as follows,  with 5 units of Botox injected at each site:   -Procerus muscle, midline injection  -Corrugator muscle, bilateral injection  -Frontalis muscle, bilateral injection, with 2 sites each side, medial injection was performed in the upper one third of the frontalis muscle, in the region vertical from the medial inferior edge of the superior orbital rim. The lateral injection was again in the upper one third of the forehead vertically above the lateral limbus of the cornea, 1.5 cm lateral to the medial injection site.  - Levator Scapulae: 5 units bilaterally  -Temporalis muscle injection, 5 sites, bilaterally. The first injection was 3 cm above the tragus of the ear, second injection site was 1.5 cm to 3 cm up from the first injection site in line with the tragus of the ear. The third injection site was 1.5-3 cm forward between the first 2 injection sites. The fourth injection site was 1.5 cm posterior to the second injection site. 5th site laterally in the temporalis  muscleat the level of the outer canthus.  - Patient feels her clenching is a trigger for headaches. +5 units masseter bilaterally   - Patient feels the migraines are centered around the eyes +5 units bilaterally at the outer canthus in the orbicularis occuli  -Occipitalis muscle injection, 3 sites, bilaterally. The first injection was done one half way between the occipital protuberance and the tip of the mastoid process behind the ear. The second injection site was done lateral and superior to the first, 1 fingerbreadth from the first injection. The third injection site was 1 fingerbreadth superiorly and medially from the first injection site.  -Cervical paraspinal muscle injection, 2 sites, bilateral knee first injection site was 1 cm from the midline of the cervical spine, 3 cm inferior to the lower border of the occipital protuberance. The second injection site was 1.5 cm superiorly and laterally to the first injection  site.  -Trapezius muscle injection was performed at 3 sites, bilaterally. The first injection site was in the upper trapezius muscle halfway between the inflection point of the neck, and the acromion. The second injection site was one half way between the acromion and the first injection site. The third injection was done between the first injection site and the inflection point of the neck.   Will return for repeat injection in 3 months.   A 200 unit sof Botox was used, any Botox not injected was wasted. The patient tolerated the procedure well, there were no complications of the above procedure.

## 2019-12-28 ENCOUNTER — Other Ambulatory Visit (HOSPITAL_COMMUNITY)
Admission: RE | Admit: 2019-12-28 | Discharge: 2019-12-28 | Disposition: A | Discharge status: Home / Self Care | Payer: 59 | Source: Ambulatory Visit | Attending: Obstetrics and Gynecology | Admitting: Obstetrics and Gynecology

## 2019-12-28 ENCOUNTER — Ambulatory Visit: Payer: 59 | Admitting: Obstetrics and Gynecology

## 2019-12-28 ENCOUNTER — Other Ambulatory Visit: Payer: Self-pay

## 2019-12-28 ENCOUNTER — Encounter: Payer: Self-pay | Admitting: Obstetrics and Gynecology

## 2019-12-28 VITALS — BP 118/68 | HR 88 | Temp 97.5°F | Resp 16 | Ht 62.0 in | Wt 156.6 lb

## 2019-12-28 DIAGNOSIS — Z01419 Encounter for gynecological examination (general) (routine) without abnormal findings: Secondary | ICD-10-CM | POA: Insufficient documentation

## 2019-12-28 DIAGNOSIS — Z113 Encounter for screening for infections with a predominantly sexual mode of transmission: Secondary | ICD-10-CM | POA: Diagnosis not present

## 2019-12-28 DIAGNOSIS — N926 Irregular menstruation, unspecified: Secondary | ICD-10-CM

## 2019-12-28 LAB — POCT URINE PREGNANCY: Preg Test, Ur: NEGATIVE

## 2019-12-28 NOTE — Patient Instructions (Signed)

## 2019-12-28 NOTE — Progress Notes (Signed)
29 y.o. G48P0000 Divorced Caucasian female here for annual exam.   Patient going through a divorce. She is currently sexually active and requesting STD testing and UPT today.   History of endometriosis and sees GYN specialist in [place].  Patient had pain with a BM and then vaginal bleeding.  Thinks she had an ovarian cyst rupture.   She takes monophasic birth control pills to control her pelvic pain. She states she has migraines with aura.  She states her migraines get worse off of her pills.    In past, Mirena IUD was uncomfortable.  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   Camilla causes constant spotting.   Not interested in Depo Provera due to potential weight gain.   Patient is an Arts administrator for [employer].  UPT today is negative.   PCP: Salli Real, MD    No LMP recorded. (Menstrual status: Oral contraceptives).     Period Cycle (Days): (no cycle with continuous pills)     Sexually active: Yes.    The current method of family planning is OCP (estrogen/progesterone).    Exercising: Yes.    walks her dog Smoker:  no  Health Maintenance: Pap: 2016 normal per patient History of abnormal Pap:  no MMG:  n/a Colonoscopy:  n/a BMD:   n/a  Result  n/a TDaP:  Unsure Gardasil:   no HIV: no Hep C:no Screening Labs:     reports that she has never smoked. She has never used smokeless tobacco. She reports current alcohol use. She reports that she does not use drugs.  Past Medical History:  Diagnosis Date  . Anxiety   . Depression   . Dizziness 2020   pt states she attributes this to "sinus pressure & pollen being out of control"  . Dysmenorrhea   . Endometriosis   . Headache   . Migraines    WITH aura  . Ovarian cyst   . Seasonal allergies   . Upper respiratory infection 2019   severe, required chest xrays, Abx, steroids, heart echo  . Vision abnormalities     Past Surgical History:  Procedure Laterality Date  . LAPAROSCOPY ABDOMEN DIAGNOSTIC    .  laporoscopy    . PELVIC LAPAROSCOPY  2016   Dx'd endometriosis  . TONSILLECTOMY      Current Outpatient Medications  Medication Sig Dispense Refill  . AJOVY 225 MG/1.5ML SOAJ INJECT 225 MG INTO THE SKIN EVERY 30 DAYS 3 pen 4  . AJOVY 225 MG/1.5ML SOSY INJECT 225 MG INTO THE SKIN EVERY 30 (THIRTY) DAYS. 1.5 mL 11  . almotriptan (AXERT) 12.5 MG tablet TAKE 1 TABLET BY MOUTH AS NEEDED FOR MIGRAINE. MAY REPEAT IN 2 HOURS IF NEEDED 10 tablet 11  . baclofen (LIORESAL) 10 MG tablet Take 1 tablet (10 mg total) by mouth 3 (three) times daily. 30 each 6  . BOTOX 100 units SOLR injection INJECT 155 UNITS  INTRAMUSCULARLY EVERY 3  MONTHS (GIVEN AT  PRESCRIBERS OFFICE, DISCARD UNUSED AFTER 1ST USE) 2 each 1  . buPROPion (WELLBUTRIN XL) 150 MG 24 hr tablet Take 150 mg by mouth daily.    . busPIRone (BUSPAR) 30 MG tablet Take 30 mg by mouth daily.    . butalbital-acetaminophen-caffeine (FIORICET, ESGIC) 50-325-40 MG tablet Take 1-2 tabs every 6 hours as needed for headache 30 tablet 3  . cetirizine (ZYRTEC) 10 MG tablet Take 10 mg by mouth daily.    . clonazePAM (KLONOPIN) 0.5 MG tablet Take 1-2 tablets (0.5-1  mg total) by mouth at bedtime. 30 tablet 4  . diazepam (VALIUM) 5 MG tablet INSERT 1 TABLET (5 MG) TABLET INTO VAGINA AT NIGHT TO TREAT PELVIC FLOOR MUSCLE SPASM.    Marland Kitchen gabapentin (NEURONTIN) 100 MG capsule TAKE 1 CAPSULE BY MOUTH THREE TIMES A DAY 90 capsule 2  . indomethacin (INDOCIN) 50 MG capsule Take 1 capsule 30 minutes before exertion up to twice a day. 60 capsule 11  . JUNEL 1/20 1-20 MG-MCG tablet TAKE 1 TABLET BY MOUTH EVERY DAY 84 tablet 1  . montelukast (SINGULAIR) 10 MG tablet     . ondansetron (ZOFRAN) 8 MG tablet TAKE 1 TABLET BY MOUTH EVERY 8 HOURS AS NEEDED FOR NAUSEA OR VOMITING. 20 tablet 5  . spironolactone (ALDACTONE) 25 MG tablet Take 25 mg by mouth daily.    . SUMAtriptan (TOSYMRA) 10 MG/ACT SOLN Place 10 mg into the nose every hour. Maximum 3 doses per day. 6 each 6  .  tiZANidine (ZANAFLEX) 4 MG tablet TAKE 1 TABLET (4 MG TOTAL) BY MOUTH EVERY 6 (SIX) HOURS AS NEEDED FOR MUSCLE SPASMS. 30 tablet 11  . Topiramate ER (TROKENDI XR) 50 MG CP24 Take 100 mg by mouth at bedtime. (Patient taking differently: Take 150 mg by mouth at bedtime. ) 30 capsule 0   No current facility-administered medications for this visit.    Family History  Problem Relation Age of Onset  . Hyperthyroidism Mother   . Thyroid disease Mother   . Hypertension Father   . Bipolar disorder Father   . Migraines Father   . Healthy Brother   . Hyperlipidemia Maternal Grandmother   . Stroke Maternal Grandmother   . Hyperlipidemia Maternal Grandfather     Review of Systems  Genitourinary: Positive for pelvic pain (RLQ pain for 48 hrs. 2 days ago).  All other systems reviewed and are negative.   Exam:   BP 118/68   Pulse 88   Temp (!) 97.5 F (36.4 C) (Temporal)   Resp 16   Ht 5\' 2"  (1.575 m)   Wt 156 lb 9.6 oz (71 kg)   BMI 28.64 kg/m     General appearance: alert, cooperative and appears stated age Head: normocephalic, without obvious abnormality, atraumatic Neck: no adenopathy, supple, symmetrical, trachea midline and thyroid normal to inspection and palpation Lungs: clear to auscultation bilaterally Breasts: normal appearance, no masses or tenderness, No nipple retraction or dimpling, No nipple discharge or bleeding, No axillary adenopathy Heart: regular rate and rhythm Abdomen: soft, non-tender; no masses, no organomegaly Extremities: extremities normal, atraumatic, no cyanosis or edema Skin: skin color, texture, turgor normal. No rashes or lesions Lymph nodes: cervical, supraclavicular, and axillary nodes normal. Neurologic: grossly normal  Pelvic: External genitalia:  no lesions              No abnormal inguinal nodes palpated.              Urethra:  normal appearing urethra with no masses, tenderness or lesions              Bartholins and Skenes: normal                  Vagina: normal appearing vagina with normal color and discharge, no lesions              Cervix: no lesions              Pap taken: Yes.   Bimanual Exam:  Uterus:  normal size, contour, position,  consistency, mobility, non-tender              Adnexa: no mass, fullness, tenderness                Chaperone was present for exam.  Assessment:   Well woman visit with normal exam. Chronic migraine with aura. Endometriosis.  On combined oral contraception.   Plan: Mammogram screening age 4. Self breast awareness reviewed. Pap and HR HPV as above. Guidelines for Calcium, Vitamin D, regular exercise program including cardiovascular and weight bearing exercise. STD screening.  I declined to refill of COCs.  We discussed at length her increased risk of stroke with her migraine hx and use of COCs. We reviewed alternatives of progesterone only contraception.  I encouraged follow up with her neurologist regarding risks of combined contraception in patients with migraine with aura.  Follow up for annual exam and prn.   After visit summary provided.

## 2019-12-29 LAB — HEP, RPR, HIV PANEL
HIV Screen 4th Generation wRfx: NONREACTIVE
Hepatitis B Surface Ag: NEGATIVE
RPR Ser Ql: NONREACTIVE

## 2019-12-29 LAB — HEPATITIS C ANTIBODY: Hep C Virus Ab: <0.1 s/co ratio (ref 0.0–0.9)

## 2020-01-01 LAB — CYTOLOGY - PAP
Chlamydia: NEGATIVE
Comment: NEGATIVE
Comment: NEGATIVE
Comment: NEGATIVE
Comment: NORMAL
Diagnosis: UNDETERMINED — AB
High risk HPV: NEGATIVE
Neisseria Gonorrhea: NEGATIVE
Trichomonas: NEGATIVE

## 2020-01-03 ENCOUNTER — Telehealth: Payer: Self-pay

## 2020-01-03 NOTE — Telephone Encounter (Signed)
Patient is calling in regards to results she received for a pap. Patient would like to review results with nurse.

## 2020-01-03 NOTE — Telephone Encounter (Signed)
Routing to Dr. Edward Jolly to review 12/28/19 pap and advise.

## 2020-01-04 NOTE — Telephone Encounter (Signed)
Spoke with patient, advised of results as seen below per Dr. Edward Jolly. Patient denies any symptoms of yeast, no Rx sent. Patient aware to call if any symptoms develop. Next AEX 12/31/20. Patient verbalizes understanding and is agreeable.   36 recall placed.   Encounter closed.

## 2020-01-04 NOTE — Telephone Encounter (Signed)
Patient's pap showed ASCUS and negative HR HPV.  By ASCCP guidelines, she is due to have a pap in 36 months.  Please inform her and place her in recall.   Her pap also showed yeast.  If she is having symptoms, she can take Diflucan 150 mg po x 1 and repeat in 72 hours if needed.  Disp: 2 RF: none.  The remaining testing was negative for gonorrhea, chlamydia, trichomonas.

## 2020-03-26 ENCOUNTER — Other Ambulatory Visit: Payer: Self-pay

## 2020-03-26 ENCOUNTER — Ambulatory Visit (INDEPENDENT_AMBULATORY_CARE_PROVIDER_SITE_OTHER): Payer: No Typology Code available for payment source | Admitting: Neurology

## 2020-03-26 DIAGNOSIS — G43709 Chronic migraine without aura, not intractable, without status migrainosus: Secondary | ICD-10-CM

## 2020-03-26 MED ORDER — CYCLOBENZAPRINE HCL 10 MG PO TABS: 10.0000 mg | ORAL_TABLET | Freq: Every day | ORAL | 3 refills | Status: DC

## 2020-03-26 NOTE — Progress Notes (Signed)
Botox- 200 units x 1 vial Lot: [id number] Expiration: 11/2022 NDC: 0023-3921-02  Bacteriostatic 0.9% Sodium Chloride- 4mL total Lot: [id number] Expiration: 05/17/2021 NDC: 0409-1966-02  Dx: G43.709 B/B  

## 2020-03-26 NOTE — Progress Notes (Signed)
Consent Form Botulism Toxin Injection For Chronic Migraine  03/26/2020: : >65% improvement in migraine frequency. Started Gabapentin for migraines, may also help with her other nerve pain and anxiety, she is doing extremely well on the Gabapentin. Also will try Baclofen at bedtime. She is doing better on zanaflex every night. Steroids helped her neck spasms. Gave her some clonazepam to see if that will help, she has dry needling at [facility] tomorrow, we also discussed Dr. Stephannie Peters as well as if she needs referral to a counselor I can ask Dr. Lance Coon for referral within her practice. Try Wellburin instead of effexor. She hasn't even had to take a triptan in 2 months she has done so well this time. She has stopped the Ajovy as she does not need it anymore.   No orders of the defined types were placed in this encounter.    Reviewed orally with patient, additionally signature is on file:  Botulism toxin has been approved by the Federal drug administration for treatment of chronic migraine. Botulism toxin does not cure chronic migraine and it may not be effective in some patients.  The administration of botulism toxin is accomplished by injecting a small amount of toxin into the muscles of the neck and head. Dosage must be titrated for each individual. Any benefits resulting from botulism toxin tend to wear off after 3 months with a repeat injection required if benefit is to be maintained. Injections are usually done every 3-4 months with maximum effect peak achieved by about 2 or 3 weeks. Botulism toxin is expensive and you should be sure of what costs you will incur resulting from the injection.  The side effects of botulism toxin use for chronic migraine may include:   -Transient, and usually mild, facial weakness with facial injections  -Transient, and usually mild, head or neck weakness with head/neck injections  -Reduction or loss of forehead facial animation due to forehead muscle  weakness  -Eyelid drooping  -Dry eye  -Pain at the site of injection or bruising at the site of injection  -Double vision  -Potential unknown long term risks  Contraindications: You should not have Botox if you are pregnant, nursing, allergic to albumin, have an infection, skin condition, or muscle weakness at the site of the injection, or have myasthenia gravis, Lambert-Eaton syndrome, or ALS.  It is also possible that as with any injection, there may be an allergic reaction or no effect from the medication. Reduced effectiveness after repeated injections is sometimes seen and rarely infection at the injection site may occur. All care will be taken to prevent these side effects. If therapy is given over a long time, atrophy and wasting in the muscle injected may occur. Occasionally the patient's become refractory to treatment because they develop antibodies to the toxin. In this event, therapy needs to be modified.  I have read the above information and consent to the administration of botulism toxin.    BOTOX PROCEDURE NOTE FOR MIGRAINE HEADACHE    Contraindications and precautions discussed with patient(above). Aseptic procedure was observed and patient tolerated procedure. Procedure performed by Dr. Artemio Aly  The condition has existed for more than 6 months, and pt does not have a diagnosis of ALS, Myasthenia Gravis or Lambert-Eaton Syndrome.  Risks and benefits of injections discussed and pt agrees to proceed with the procedure.  Written consent obtained  These injections are medically necessary. Pt  receives good benefits from these injections. These injections do not cause sedations or hallucinations  hallucinations which the oral therapies may cause.  Description of procedure:  The patient was placed in a sitting position. The standard protocol was used for Botox as follows, with 5 units of Botox injected at each site:   -Procerus muscle, midline injection  -Corrugator muscle, bilateral  injection  -Frontalis muscle, bilateral injection, with 2 sites each side, medial injection was performed in the upper one third of the frontalis muscle, in the region vertical from the medial inferior edge of the superior orbital rim. The lateral injection was again in the upper one third of the forehead vertically above the lateral limbus of the cornea, 1.5 cm lateral to the medial injection site.  - Levator Scapulae: 5 units bilaterally  -Temporalis muscle injection, 5 sites, bilaterally. The first injection was 3 cm above the tragus of the ear, second injection site was 1.5 cm to 3 cm up from the first injection site in line with the tragus of the ear. The third injection site was 1.5-3 cm forward between the first 2 injection sites. The fourth injection site was 1.5 cm posterior to the second injection site. 5th site laterally in the temporalis  muscleat the level of the outer canthus.  - Patient feels her clenching is a trigger for headaches. +5 units masseter bilaterally   - Patient feels the migraines are centered around the eyes +5 units bilaterally at the outer canthus in the orbicularis occuli  -Occipitalis muscle injection, 3 sites, bilaterally. The first injection was done one half way between the occipital protuberance and the tip of the mastoid process behind the ear. The second injection site was done lateral and superior to the first, 1 fingerbreadth from the first injection. The third injection site was 1 fingerbreadth superiorly and medially from the first injection site.  -Cervical paraspinal muscle injection, 2 sites, bilateral knee first injection site was 1 cm from the midline of the cervical spine, 3 cm inferior to the lower border of the occipital protuberance. The second injection site was 1.5 cm superiorly and laterally to the first injection site.  -Trapezius muscle injection was performed at 3 sites, bilaterally. The first injection site was in the upper trapezius muscle  halfway between the inflection point of the neck, and the acromion. The second injection site was one half way between the acromion and the first injection site. The third injection was done between the first injection site and the inflection point of the neck.   Will return for repeat injection in 3 months.   A 200 unit sof Botox was used, any Botox not injected was wasted. The patient tolerated the procedure well, there were no complications of the above procedure.   

## 2020-05-16 ENCOUNTER — Telehealth: Payer: Self-pay | Admitting: Neurology

## 2020-05-16 MED ORDER — BOTOX 100 UNITS IJ SOLR: INTRAMUSCULAR | 1 refills | Status: DC

## 2020-05-16 NOTE — Addendum Note (Signed)
Addended by: Bertram Savin on: 05/16/2020 04:30 PM   Modules accepted: Orders

## 2020-05-16 NOTE — Telephone Encounter (Signed)
Patient's next Botox injection is scheduled for 11/3. Her current PA on file with [employer]ia P[employer] will expire on 10/18. I filled out new PA form and faxed to [facility] with clinical notes.   Bethany, Can you send patient's Botox prescription to [facility]?

## 2020-05-29 NOTE — Telephone Encounter (Signed)
I called [employer] to check the status of PA for Botox. The automated system told me that patient's insurance termed on 04/16/20. Reference #[id number]. I called the patient and she states that information is incorrect. She states that she does have a new insurance with a high deductible but she still pays for the [place]. She states that she received a claim from her last injection for $3,500. I told her that she would need to speak with Angie in billing. FYI Angie

## 2020-05-30 NOTE — Telephone Encounter (Signed)
I spoke with patient again today. She states that the information that she provided me with was incorrect, and her [employer] policy did term on 04/16/20. She gave me a copy of her new insurance card [facility]). I added it to patient's chart and will call to see if PA for Botox is needed.

## 2020-06-03 NOTE — Telephone Encounter (Signed)
I called [employer] to see if PA is required for J0585 and 62831. I spoke with Riley Lam and she states no PA is required for either code. She states patient will need to use [employer]. Reference #[id number].

## 2020-06-11 NOTE — Telephone Encounter (Addendum)
I called [employer] to see if Botox could be delivered. I spoke with Sheryle Spray who states medical benefit review needs to be completed.

## 2020-06-17 NOTE — Telephone Encounter (Signed)
It is fine with me, ask Angie too

## 2020-06-17 NOTE — Telephone Encounter (Signed)
Ok per [employer].

## 2020-06-17 NOTE — Telephone Encounter (Signed)
I called [employer] to check on Botox delivery. I spoke with Jonny Ruiz who states that patient has a 1,253.00 co-pay. He states that he has spoken with patient and she states she can not pay that. I then called the patient and spoke with her about the Botox Savings Program. She states she will sign up for this. Is it okay to proceed with patient as B/B?

## 2020-06-19 ENCOUNTER — Ambulatory Visit (INDEPENDENT_AMBULATORY_CARE_PROVIDER_SITE_OTHER): Payer: 59 | Admitting: Neurology

## 2020-06-19 ENCOUNTER — Other Ambulatory Visit: Payer: Self-pay

## 2020-06-19 ENCOUNTER — Ambulatory Visit: Payer: Self-pay | Admitting: Neurology

## 2020-06-19 DIAGNOSIS — G43709 Chronic migraine without aura, not intractable, without status migrainosus: Secondary | ICD-10-CM | POA: Diagnosis not present

## 2020-06-19 MED ORDER — GABAPENTIN 100 MG PO CAPS: ORAL_CAPSULE | ORAL | 3 refills | Status: AC

## 2020-06-19 MED ORDER — ONDANSETRON HCL 8 MG PO TABS: ORAL_TABLET | ORAL | 3 refills | Status: DC

## 2020-06-19 NOTE — Progress Notes (Signed)
Consent Form Botulism Toxin Injection For Chronic Migraine  06/19/2020: doing great, >70% improvement in migraine and headache frequency, +a.   03/26/2020: : >65% improvement in migraine frequency. Started Gabapentin for migraines, may also help with her other nerve pain and anxiety, she is doing extremely well on the Gabapentin. Also will try Baclofen at bedtime. She is doing better on zanaflex every night. Steroids helped her neck spasms. Gave her some clonazepam to see if that will help, she has dry needling at [facility] tomorrow, we also discussed Dr. Stephannie Peters as well as if she needs referral to a counselor I can ask Dr. Lance Coon for referral within her practice. Try Wellburin instead of effexor. She hasn't even had to take a triptan in 2 months she has done so well this time. She has stopped the Ajovy as she does not need it anymore. +a  Meds ordered this encounter  Medications  . gabapentin (NEURONTIN) 100 MG capsule    Sig: TAKE 1 CAPSULE BY MOUTH THREE TIMES A DAY.    Dispense:  270 capsule    Refill:  3    90 day supply.  . ondansetron (ZOFRAN) 8 MG tablet    Sig: TAKE 1 TABLET BY MOUTH EVERY 8 HOURS AS NEEDED FOR NAUSEA OR VOMITING.    Dispense:  60 tablet    Refill:  3    90 day supply.     Reviewed orally with patient, additionally signature is on file:  Botulism toxin has been approved by the Federal drug administration for treatment of chronic migraine. Botulism toxin does not cure chronic migraine and it may not be effective in some patients.  The administration of botulism toxin is accomplished by injecting a small amount of toxin into the muscles of the neck and head. Dosage must be titrated for each individual. Any benefits resulting from botulism toxin tend to wear off after 3 months with a repeat injection required if benefit is to be maintained. Injections are usually done every 3-4 months with maximum effect peak achieved by about 2 or 3 weeks. Botulism toxin is expensive  and you should be sure of what costs you will incur resulting from the injection.  The side effects of botulism toxin use for chronic migraine may include:   -Transient, and usually mild, facial weakness with facial injections  -Transient, and usually mild, head or neck weakness with head/neck injections  -Reduction or loss of forehead facial animation due to forehead muscle weakness  -Eyelid drooping  -Dry eye  -Pain at the site of injection or bruising at the site of injection  -Double vision  -Potential unknown long term risks  Contraindications: You should not have Botox if you are pregnant, nursing, allergic to albumin, have an infection, skin condition, or muscle weakness at the site of the injection, or have myasthenia gravis, Lambert-Eaton syndrome, or ALS.  It is also possible that as with any injection, there may be an allergic reaction or no effect from the medication. Reduced effectiveness after repeated injections is sometimes seen and rarely infection at the injection site may occur. All care will be taken to prevent these side effects. If therapy is given over a long time, atrophy and wasting in the muscle injected may occur. Occasionally the patient's become refractory to treatment because they develop antibodies to the toxin. In this event, therapy needs to be modified.  I have read the above information and consent to the administration of botulism toxin.    BOTOX PROCEDURE  NOTE FOR MIGRAINE HEADACHE    Contraindications and precautions discussed with patient(above). Aseptic procedure was observed and patient tolerated procedure. Procedure performed by Dr. Artemio Aly  The condition has existed for more than 6 months, and pt does not have a diagnosis of ALS, Myasthenia Gravis or Lambert-Eaton Syndrome.  Risks and benefits of injections discussed and pt agrees to proceed with the procedure.  Written consent obtained  These injections are medically necessary. Pt  receives  good benefits from these injections. These injections do not cause sedations or hallucinations which the oral therapies may cause.  Description of procedure:  The patient was placed in a sitting position. The standard protocol was used for Botox as follows, with 5 units of Botox injected at each site:   -Procerus muscle, midline injection  -Corrugator muscle, bilateral injection  -Frontalis muscle, bilateral injection, with 2 sites each side, medial injection was performed in the upper one third of the frontalis muscle, in the region vertical from the medial inferior edge of the superior orbital rim. The lateral injection was again in the upper one third of the forehead vertically above the lateral limbus of the cornea, 1.5 cm lateral to the medial injection site.  - Levator Scapulae: 5 units bilaterally  -Temporalis muscle injection, 5 sites, bilaterally. The first injection was 3 cm above the tragus of the ear, second injection site was 1.5 cm to 3 cm up from the first injection site in line with the tragus of the ear. The third injection site was 1.5-3 cm forward between the first 2 injection sites. The fourth injection site was 1.5 cm posterior to the second injection site. 5th site laterally in the temporalis  muscleat the level of the outer canthus.  - Patient feels her clenching is a trigger for headaches. +5 units masseter bilaterally   - Patient feels the migraines are centered around the eyes +5 units bilaterally at the outer canthus in the orbicularis occuli  -Occipitalis muscle injection, 3 sites, bilaterally. The first injection was done one half way between the occipital protuberance and the tip of the mastoid process behind the ear. The second injection site was done lateral and superior to the first, 1 fingerbreadth from the first injection. The third injection site was 1 fingerbreadth superiorly and medially from the first injection site.  -Cervical paraspinal muscle  injection, 2 sites, bilateral knee first injection site was 1 cm from the midline of the cervical spine, 3 cm inferior to the lower border of the occipital protuberance. The second injection site was 1.5 cm superiorly and laterally to the first injection site.  -Trapezius muscle injection was performed at 3 sites, bilaterally. The first injection site was in the upper trapezius muscle halfway between the inflection point of the neck, and the acromion. The second injection site was one half way between the acromion and the first injection site. The third injection was done between the first injection site and the inflection point of the neck.   Will return for repeat injection in 3 months.   A 200 unit sof Botox was used, any Botox not injected was wasted. The patient tolerated the procedure well, there were no complications of the above procedure.

## 2020-06-19 NOTE — Progress Notes (Signed)
Botox- 200 units x 1 vial Lot: [id number] Expiration: 01/2023 NDC: 7948-0165-53  Bacteriostatic 0.9% Sodium Chloride- 81mL total Lot: [id number] Expiration: 09/17/2021 NDC: 7867-5449-20  Dx: F00.712 B/B

## 2020-07-08 ENCOUNTER — Other Ambulatory Visit: Payer: Self-pay | Admitting: Neurology

## 2020-07-15 ENCOUNTER — Other Ambulatory Visit: Payer: Self-pay | Admitting: Neurology

## 2020-07-16 ENCOUNTER — Other Ambulatory Visit: Payer: Self-pay | Admitting: Neurology

## 2020-08-07 ENCOUNTER — Ambulatory Visit: Payer: 59 | Attending: Obstetrics and Gynecology | Admitting: Physical Therapy

## 2020-08-07 ENCOUNTER — Other Ambulatory Visit: Payer: Self-pay

## 2020-08-07 ENCOUNTER — Encounter: Payer: Self-pay | Admitting: Physical Therapy

## 2020-08-07 DIAGNOSIS — R278 Other lack of coordination: Secondary | ICD-10-CM | POA: Diagnosis present

## 2020-08-07 DIAGNOSIS — M6281 Muscle weakness (generalized): Secondary | ICD-10-CM | POA: Diagnosis not present

## 2020-08-07 DIAGNOSIS — R252 Cramp and spasm: Secondary | ICD-10-CM | POA: Diagnosis present
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
## 2020-08-07 NOTE — Patient Instructions (Addendum)
Irritants/Allergens ( on exposure, can cause immediate stinging or burning)  Body fluids: urine, feces, vaginal discharge, sweat, semen  Feminine Hygiene Products: douches, yogurt douche, feminine wipes, baby wipes, sanitary pads, panty liners, tampons, deodorants (including deodorants in tampons/pads), lotions, powders (including talcum powder), perfumes, shampoos,soaps  Sexual support: lubricants, condoms, diaphragms, spermicides, arousal stimulants  Laundry detergent, bleach, fabric softener  Cleansing products: soap, bubble baths and salts, shampoo, conditioner, perfumed toielt paper  Physical irritants: tight fitting clothes, Nylon underwear, synthetic undergarments/pantyhose, chemically treated clothing, latex, wash cloths, sponges, hot water, excessive washing, vigorous drying with towel/hair dryer  HPV medication, tea tree oil, [place]  Alcohol and astringents  Topical medicaments: Benzocaine, Neomycin, Chlorhexidine (in K-Y Jelly), Imidazole antifungal, Propylene glycol ( Preservative used in many products:, tea tree oil, preservatives and bases medications are placed in.   Adapted from the V Book by Gardiner Ramus. Roseanne Reno, MD., and Gwynneth Albright [facility] Books, 2002), and Proceedings in Obstetrics and Gynecology, 2014; 4(2):1    Gentle Vulvar care- Adapted from the National Vulvodynia Association and the V Book  Wear loose clothing.  Wear all-cotton underwear, and go without when at home. Avoid wearing pantyhose, wear thigh high or knee high pantyhose. If you do not use underwear with yoga pants, try using an all-cotton against the vulva. Tight clothing can be irritating to the vulva by increasing friction and exacerbate irritation and redness.  Wear loose fitting clothes when possible. When swimming, remove wet bathing suits and shower to remove cholerine from the area.Marland Kitchen  Avoid hot tubs and heavily chlorinated pools. Shower after exercise to eliminate excess consider avoiding  exercise that causes friction or pressure to the area such as horseback riding, bicycle riding, and spinning classes.   To cleanse the area, use your fingers instead of a washcloth and plain water.  If you feel you must use a cleanser, unscented, non-alkaline cleanser such as Cetaphil, or mild soaps made for sensitive skin such as Dove or Neurtrogena. Be sure any cleaners are unscented. Avoid rubbing the vulva. Soak for five minutes in lukewarm water to remove any residue of sweat or lotions or other products.  Pat dry, and apply any prescribed medication. Avoid products with multiple ingredients.  Even those that sound designed for vulvar care, like A& D Original Ointment, baby lotion, or Vagisil, contain chemicals that could irritate or cause contact dermatitis. Avoid using bubble bath, feminine hygiene sprays, or any type of perfumed creams or sprays near the vulva.  In the bathroom, forgo moistened wipes. If you want moisture, use a spray bottle with plain water, and then pat dry. Use soft, white, unscented toilet paper. Avoid repetitive back and forth motion or rubbing. Drink plenty of water and fluids to dilute urine. Concentrated urine can be irritating to the vulvar skin.   When doing laundry, use hypoallergenic laundry detergent that is very mild, such as those made for babies. Avoid using fabric softeners with undergarments and double rinse undergarments.  For sexual intercourse, be sure to use a lubricant that is without additives or preservatives.  Additives like bactericides, spermicides, warming agents, and flavors can cause vulvar irritation. Rinse the vulva after intercourse. A frozen gel pack wrapped in a towel can be used for irritation after exercise or intercourse.   Look into V-magic can get on [facility] [address], [address] [place], [place] [address] Phone # [phone] Fax [phone]

## 2020-08-29 ENCOUNTER — Encounter: Payer: Self-pay | Admitting: Physical Therapy

## 2020-08-29 ENCOUNTER — Ambulatory Visit: Payer: 59 | Attending: Obstetrics and Gynecology | Admitting: Physical Therapy

## 2020-08-29 ENCOUNTER — Other Ambulatory Visit: Payer: Self-pay

## 2020-08-29 DIAGNOSIS — R252 Cramp and spasm: Secondary | ICD-10-CM | POA: Insufficient documentation

## 2020-08-29 DIAGNOSIS — M6281 Muscle weakness (generalized): Secondary | ICD-10-CM

## 2020-08-29 DIAGNOSIS — R278 Other lack of coordination: Secondary | ICD-10-CM | POA: Diagnosis present

## 2020-08-29 NOTE — Therapy (Signed)
[facility] [address], [place], [place], [address] Phone: [phone]   Fax:  [phone]  Physical Therapy Treatment  Patient Details  Name: Destiny Ellis MRN: [id number] Date of Birth: Sep 08, 1990 Referring Provider (PT): Dr. Jackquline Denmark   Encounter Date: 08/29/2020   PT End of Session - 08/29/20 1536    Visit Number 2    Date for PT Re-Evaluation 12/06/20    Authorization Type UHC    PT Start Time 7673    PT Stop Time 4193    PT Time Calculation (min) 45 min    Activity Tolerance Patient tolerated treatment well;No increased pain    Behavior During Therapy WFL for tasks assessed/performed           Past Medical History:  Diagnosis Date  . Anxiety   . Depression   . Dizziness 2020   pt states she attributes this to "sinus pressure & pollen being out of control"  . Dysmenorrhea   . Endometriosis   . Headache   . Migraines    WITH aura  . Ovarian cyst   . Seasonal allergies   . Upper respiratory infection 2019   severe, required chest xrays, Abx, steroids, heart echo  . Vision abnormalities     Past Surgical History:  Procedure Laterality Date  . LAPAROSCOPY ABDOMEN DIAGNOSTIC    . laporoscopy    . PELVIC LAPAROSCOPY  2016   Dx'd endometriosis  . TONSILLECTOMY      There were no vitals filed for this visit.   Subjective Assessment - 08/29/20 1538    Subjective I had some pain in the lower abdominal after the eval.    Patient Stated Goals reduce pain    Currently in Pain? Yes    Pain Score 5     Pain Location Pelvis    Pain Orientation Right;Left    Pain Descriptors / Indicators Discomfort    Pain Type Chronic pain    Pain Onset More than a month ago    Pain Frequency Constant    Aggravating Factors  sitting, pain makes her anxious, when 10/10 may need to go to the hosptial, driving    Pain Relieving Factors stop what she is doing, heating pad, lay flat on back, epson salt baths,  valium suppositories    Multiple Pain Sites Yes    Pain Score 5    Pain Location Back    Pain Orientation Lower    Pain Descriptors / Indicators Aching;Cramping;Sharp    Pain Type Chronic pain    Pain Onset More than a month ago    Pain Frequency Intermittent    Aggravating Factors  driving    Pain Relieving Factors rest                             [facility] Adult PT Treatment/Exercise - 08/29/20 0001      Self-Care   Self-Care Other Self-Care Comments    Other Self-Care Comments  gave patient you tube video for hip adductor massage and hip stretches      Manual Therapy   Manual Therapy Myofascial release;Soft tissue mobilization    Manual therapy comments educated pateint on how to perform manual work suprapubically and along the hip adductors    Soft tissue mobilization suprapubically pull upward to pull the tissue off the bladder, soft tissue work to the hip adductors    Myofascial Release fascial release through  the 3 planes for thoracic inlet, respiratory diaphgram, and urogenital diaphgram                  PT Education - 08/29/20 1617    Education Details perineal massage for the hip adductors, pelvic floor stretches, manual work suprapubic bone    Person(s) Educated Patient    Methods Explanation;Handout    Comprehension Verbalized understanding            PT Short Term Goals - 08/07/20 1238      PT SHORT TERM GOAL #1   Title independent with stretches for her hips and pelvic floor    Time 4    Period Weeks    Status New    Target Date 09/04/20      PT SHORT TERM GOAL #2   Title understand ways to care for the vulvar skin to reduce the irritation    Time 4    Period Weeks    Status New    Target Date 09/04/20      PT SHORT TERM GOAL #3   Title able to perform diaphragmatic breathing to elongate the pelvic floor    Time 4    Period Weeks    Status New    Target Date 09/04/20             PT Long Term Goals - 08/07/20 1240       PT LONG TERM GOAL #1   Title independent with advanced HEP    Time 4    Period Months    Status New    Target Date 12/06/20      PT LONG TERM GOAL #2   Title able to use vaginal dilators or wand to reduce tissue tension in the pelvic floor muscles    Time 4    Period Months    Status New    Target Date 12/06/20      PT LONG TERM GOAL #3   Title able to have a vaginal exam with reduction of anxiety due to pelvic pain decreased </=2/10    Time 4    Period Months    Status New    Target Date 12/06/20      PT LONG TERM GOAL #4   Title able to wear a tampon due to expansion of the vaginal canal and reduction of the pain </= 2/10    Time 4    Period Months    Status New    Target Date 12/06/20      PT LONG TERM GOAL #5   Title abdominal pain decreased </= 2/10 due to elongation of the muscles and understanding pain management    Time 4    Period Months    Status New    Target Date 12/06/20                 Plan - 08/29/20 1536    Clinical Impression Statement Patient was able to tolerate the therapist to do manual work suprapubically and inner thigh through her cloths. Patient has tightness suprapubically. Patient would have muscle cramping after the manual work. Patient has not met goals due to just starting therapy. Patient will benefit from skilled therapy to reduce her pain and improve her function.    Personal Factors and Comorbidities Age;Comorbidity 2;Sex;Time since onset of injury/illness/exacerbation;Past/Current Experience    Comorbidities endometiriosis; PTSD;    Examination-Activity Limitations Locomotion Level;Transfers;Bed Mobility;Bend;Reach Overhead;Carry;Sit;Sleep;Dressing;Toileting;Stand;Stairs;Squat    Examination-Participation Restrictions Meal Prep;Cleaning;Occupation;Community Activity;Driving;Interpersonal Relationship;Shop  Stability/Clinical Decision Making Evolving/Moderate complexity    Rehab Potential Good    PT Frequency 1x / week     PT Duration Other (comment)   4 months   PT Treatment/Interventions ADLs/Self Care Home Management;Biofeedback;Cryotherapy;Electrical Stimulation;Iontophoresis 79m/ml Dexamethasone;Moist Heat;Ultrasound;Neuromuscular re-education;Therapeutic exercise;Therapeutic activities;Patient/family education;Manual techniques;Energy conservation;Dry needling;Passive range of motion;Taping;Spinal Manipulations;Joint Manipulations    PT Next Visit Plan diaphragmatic breathing, fascial release in the abdomen and the 3 planes; gentle hip stretches; foam rolling    Recommended Other Services MD signed iniital eval    Consulted and Agree with Plan of Care Patient           Patient will benefit from skilled therapeutic intervention in order to improve the following deficits and impairments:  Decreased coordination,Decreased range of motion,Increased fascial restricitons,Decreased endurance,Increased muscle spasms,Decreased activity tolerance,Pain,Decreased strength,Decreased mobility,Impaired flexibility  Visit Diagnosis: Muscle weakness (generalized)  Cramp and spasm  Other lack of coordination     Problem List Patient Active Problem List   Diagnosis Date Noted  . Neck pain 11/30/2018  . Chronic migraine without aura without status migrainosus, not intractable 03/13/2017    CEarlie Counts PT 08/29/20 4:22 PM   [facility] [address] [place] [address]Phone: [phone]  Fax:  [phone] Name: MHailea EaglinMRN: [id number] of Birth: 710-08-1990

## 2020-08-29 NOTE — Patient Instructions (Addendum)
   Self treatment for pelvic pain  [facility]    Pelvic Floor Release Stretches  [employer]  Above the suprapubic bone pulling the skin upward  [facility] [address] [place], [place] [address] Phone # [phone] Fax [phone]

## 2020-09-05 ENCOUNTER — Encounter: Payer: 59 | Admitting: Physical Therapy

## 2020-09-09 ENCOUNTER — Telehealth: Payer: Self-pay | Admitting: Neurology

## 2020-09-09 ENCOUNTER — Ambulatory Visit: Payer: 59 | Admitting: Physical Therapy

## 2020-09-09 NOTE — Telephone Encounter (Signed)
Patient has a Botox appointment on 2/3. She has expressed in the past that she would prefer not to use specialty pharmacy due to cost. Unfortunately, her insurance requires that we get her Botox through [employer]. I called the patient and briefly explained this to her. I was not able to go into detail because the patient states she fell last week and hit her head and is "fuzzy." Patient wanted me to send the information in a MyChart message as she is not retaining information well at this time. I advised that I would do so. The patient went on to state that she is feeling worse this week than she did last week. She states Dr. Lucia Gaskins is already aware of this and patient is trying to figure out the next steps. I advised patient I would share our conversation with Dr. Lucia Gaskins as well. Patient is concerned that she should not have the injections as she hit her head in an area that is usually injected.

## 2020-09-09 NOTE — Telephone Encounter (Signed)
I called [facility] and spoke with [place] to check status of Botox prescription. She states that she will have to send a request to the revenue team to start medical benefit review for the month.

## 2020-09-09 NOTE — Telephone Encounter (Signed)
I can use samples if needed thanks

## 2020-09-09 NOTE — Telephone Encounter (Signed)
I think we will be able to get medication from pharmacy in time, just wanted to make sure we did not need to cancel or change patient's Botox appointment due to her fall.

## 2020-09-10 NOTE — Telephone Encounter (Signed)
Received fax from [facility] requesting medical notes for review. I faxed medical notes back to them to prove medical necessity.

## 2020-09-16 ENCOUNTER — Other Ambulatory Visit: Payer: Self-pay | Admitting: Neurology

## 2020-09-16 NOTE — Telephone Encounter (Addendum)
I called [facility] to check status of Botox order. I spoke with [place] who states that there is not a prescription on file. She transferred me to a pharmacist, Onalee Hua, to give the prescription for Botox. He checked patient's insurance and states that it is rejecting in the system and termed at the beginning of the year. I advised that patient's insurance is active. He states there could be a new ID number. He advised to check back on Wednesday for updated status. I called patient's insurance [phone]). Plan is still active.

## 2020-09-17 NOTE — Telephone Encounter (Signed)
I called [employer] and spoke to [place] who states that the order is under medical benefit review. She states it should be ready by tomorrow morning. Patient has not yet given consent.

## 2020-09-18 ENCOUNTER — Ambulatory Visit: Payer: 59 | Attending: Obstetrics and Gynecology | Admitting: Physical Therapy

## 2020-09-18 ENCOUNTER — Other Ambulatory Visit: Payer: Self-pay

## 2020-09-18 ENCOUNTER — Encounter: Payer: Self-pay | Admitting: Physical Therapy

## 2020-09-18 DIAGNOSIS — R252 Cramp and spasm: Secondary | ICD-10-CM | POA: Diagnosis present

## 2020-09-18 DIAGNOSIS — R278 Other lack of coordination: Secondary | ICD-10-CM | POA: Insufficient documentation

## 2020-09-18 DIAGNOSIS — M6281 Muscle weakness (generalized): Secondary | ICD-10-CM

## 2020-09-18 NOTE — Therapy (Addendum)
[facility] [address] [place], [place], [address] Phone: [phone]   Fax:  [phone]  Physical Therapy Treatment  Patient Details  Name: Destiny Ellis MRN: [id number] Date of Birth: Feb 03, 1991 Referring Provider (PT): Dr. Jackquline Denmark   Encounter Date: 09/18/2020   PT End of Session - 09/18/20 1701    Visit Number 3    Authorization Type UHC    PT Start Time 3151    PT Stop Time 1656    PT Time Calculation (min) 41 min    Activity Tolerance Patient tolerated treatment well;No increased pain    Behavior During Therapy WFL for tasks assessed/performed           Past Medical History:  Diagnosis Date  . Anxiety   . Depression   . Dizziness 2020   pt states she attributes this to "sinus pressure & pollen being out of control"  . Dysmenorrhea   . Endometriosis   . Headache   . Migraines    WITH aura  . Ovarian cyst   . Seasonal allergies   . Upper respiratory infection 2019   severe, required chest xrays, Abx, steroids, heart echo  . Vision abnormalities     Past Surgical History:  Procedure Laterality Date  . LAPAROSCOPY ABDOMEN DIAGNOSTIC    . laporoscopy    . PELVIC LAPAROSCOPY  2016   Dx'd endometriosis  . TONSILLECTOMY      There were no vitals filed for this visit.   Subjective Assessment - 09/18/20 1619    Subjective After last visit I tripped over my feet and hit my head and bridge of the nose. I have some dizzyness and headache. I am seeing my neurologist tomorrow.    Patient Stated Goals reduce pain    Currently in Pain? Yes    Pain Score 5     Pain Location Pelvis    Pain Orientation Right;Left    Pain Descriptors / Indicators Discomfort    Pain Type Chronic pain    Pain Onset More than a month ago    Pain Frequency Constant    Aggravating Factors  sititng, pain makes  her anxious, wake her up at night at level 8/10    Pain Relieving Factors stop what she is doing,  heating pad, lay flat on back, epson salt baths, valium suppositories    Multiple Pain Sites No                             [facility] Adult PT Treatment/Exercise - 09/18/20 0001      Lumbar Exercises: Stretches   Active Hamstring Stretch Right;Left;1 rep;30 seconds    Active Hamstring Stretch Limitations supine with strap    Double Knee to Chest Stretch 1 rep;30 seconds    Double Knee to Chest Stretch Limitations with knees apart    Lower Trunk Rotation 2 reps;30 seconds    Lower Trunk Rotation Limitations each side    Other Lumbar Stretch Exercise butterfly stretch holding for 1 min.      Manual Therapy   Manual Therapy Myofascial release    Myofascial Release fascial release throughout the abdomen feeling for restrictions, painful areas and around the diaphgram, release of the pubic vessicle                  PT Education - 09/18/20 1700    Education Details Access Code: [email or web address]    Person(s)  Educated Patient    Methods Explanation;Demonstration;Verbal cues;Handout    Comprehension Returned demonstration;Verbalized understanding            PT Short Term Goals - 09/18/20 1707      PT SHORT TERM GOAL #1   Title independent with stretches for her hips and pelvic floor    Time 4    Period Weeks    Status On-going      PT SHORT TERM GOAL #2   Title understand ways to care for the vulvar skin to reduce the irritation    Time 4    Period Weeks    Status On-going      PT SHORT TERM GOAL #3   Title able to perform diaphragmatic breathing to elongate the pelvic floor    Time 4    Period Weeks    Status On-going             PT Long Term Goals - 08/07/20 1240      PT LONG TERM GOAL #1   Title independent with advanced HEP    Time 4    Period Months    Status New    Target Date 12/06/20      PT LONG TERM GOAL #2   Title able to use vaginal dilators or wand to reduce tissue tension in the pelvic floor muscles    Time 4    Period Months     Status New    Target Date 12/06/20      PT LONG TERM GOAL #3   Title able to have a vaginal exam with reduction of anxiety due to pelvic pain decreased </=2/10    Time 4    Period Months    Status New    Target Date 12/06/20      PT LONG TERM GOAL #4   Title able to wear a tampon due to expansion of the vaginal canal and reduction of the pain </= 2/10    Time 4    Period Months    Status New    Target Date 12/06/20      PT LONG TERM GOAL #5   Title abdominal pain decreased </= 2/10 due to elongation of the muscles and understanding pain management    Time 4    Period Months    Status New    Target Date 12/06/20                 Plan - 09/18/20 1702    Clinical Impression Statement Patient has fallen on her steps and shows signs of a concussion. She will be seeing her neurolgist tomorrow. Patient was havig a headache during therapy and at the end felt nauseous. Patient was able to stretch her legs today and understands on how to do the stretches at home. Patient holds herself in a flexed posture with shoulders elevated and therapist discussed with her about her posture and how it could intensify her pain. Patient has fascial restrictions in the abdomen and tender areas. She is doing good diaphragmatic breathing. Patient does better with going slowly in therapy. Patient will benefit from skilled therapy to reduce her pain and improve her function.    Personal Factors and Comorbidities Age;Comorbidity 2;Sex;Time since onset of injury/illness/exacerbation;Past/Current Experience    Comorbidities endometiriosis; PTSD;    Examination-Activity Limitations Locomotion Level;Transfers;Bed Mobility;Bend;Reach Overhead;Carry;Sit;Sleep;Dressing;Toileting;Stand;Stairs;Squat    Examination-Participation Restrictions Meal Prep;Cleaning;Occupation;Community Activity;Driving;Interpersonal Relationship;Shop    Stability/Clinical Decision Making Evolving/Moderate complexity    Rehab Potential Good  PT Frequency 1x / week    PT Duration Other (comment)   4 months   PT Treatment/Interventions ADLs/Self Care Home Management;Biofeedback;Cryotherapy;Electrical Stimulation;Iontophoresis 75m/ml Dexamethasone;Moist Heat;Ultrasound;Neuromuscular re-education;Therapeutic exercise;Therapeutic activities;Patient/family education;Manual techniques;Energy conservation;Dry needling;Passive range of motion;Taping;Spinal Manipulations;Joint Manipulations    PT Next Visit Plan diaphragmatic breathing, fascial release in the abdomen and the 3 planes; gentle hip stretches; tissue rolling of the thighs, have her start, see what her neurologist says; chck on vulvar care    PT Home Exercise Plan Access Code: [email or web address]    Consulted and Agree with Plan of Care Patient           Patient will benefit from skilled therapeutic intervention in order to improve the following deficits and impairments:  Decreased coordination,Decreased range of motion,Increased fascial restricitons,Decreased endurance,Increased muscle spasms,Decreased activity tolerance,Pain,Decreased strength,Decreased mobility,Impaired flexibility  Visit Diagnosis: Muscle weakness (generalized)  Cramp and spasm  Other lack of coordination     Problem List Patient Active Problem List   Diagnosis Date Noted  . Neck pain 11/30/2018  . Chronic migraine without aura without status migrainosus, not intractable 03/13/2017    CEarlie Counts PT 09/18/20 5:09 PM   [facility] [address] [place] [address]Phone: [phone]  Fax:  [phone] Name: MInioluwa BoulayMRN: [id number] of Birth: 707-26-1992 PHYSICAL THERAPY DISCHARGE SUMMARY  Visits from Start of Care: 3  Current functional level related to goals / functional outcomes: See above.    Remaining deficits: See above.   Education / Equipment: HEP Plan:                                                     Patient goals were not met. Patient is being discharged due to not returning since the last visit.  Thank you for the referral. CEarlie Counts PT 11/11/20 5:03 PM  ?????

## 2020-09-18 NOTE — Telephone Encounter (Signed)
I called [employer] and spoke with Corrie Dandy to check order status. Corrie Dandy states that the order is ready for scheduling, but the patient has not given consent or co-pay card information. Mary placed me on hold to call the patient. She states patient did not answer and mailbox is full. After our call ended, I attempted to call the patient but was directed to voicemail. VM is full.

## 2020-09-18 NOTE — Progress Notes (Signed)
Consent Form Botulism Toxin Injection For Chronic Migraine 09/20/2019: still doing great > 70% improvement  06/19/2020: doing great, >70% improvement in migraine and headache frequency, +a.   03/26/2020: : >65% improvement in migraine frequency. Started Gabapentin for migraines, may also help with her other nerve pain and anxiety, she is doing extremely well on the Gabapentin. Also will try Baclofen at bedtime. She is doing better on zanaflex every night. Steroids helped her neck spasms. Gave her some clonazepam to see if that will help, she has dry needling at [facility] PT tomorrow, we also discussed Dr. Stephannie Peters as well as if she needs referral to a counselor I can ask Dr. Lance Coon for referral within her practice. Try Wellburin instead of effexor. She hasn't even had to take a triptan in 2 months she has done so well this time. She has stopped the Ajovy as she does not need it anymore. +a  CT head  No orders of the defined types were placed in this encounter.    Reviewed orally with patient, additionally signature is on file:  Botulism toxin has been approved by the Federal drug administration for treatment of chronic migraine. Botulism toxin does not cure chronic migraine and it may not be effective in some patients.  The administration of botulism toxin is accomplished by injecting a small amount of toxin into the muscles of the neck and head. Dosage must be titrated for each individual. Any benefits resulting from botulism toxin tend to wear off after 3 months with a repeat injection required if benefit is to be maintained. Injections are usually done every 3-4 months with maximum effect peak achieved by about 2 or 3 weeks. Botulism toxin is expensive and you should be sure of what costs you will incur resulting from the injection.  The side effects of botulism toxin use for chronic migraine may include:   -Transient, and usually mild, facial weakness with facial injections  -Transient, and usually  mild, head or neck weakness with head/neck injections  -Reduction or loss of forehead facial animation due to forehead muscle weakness  -Eyelid drooping  -Dry eye  -Pain at the site of injection or bruising at the site of injection  -Double vision  -Potential unknown long term risks  Contraindications: You should not have Botox if you are pregnant, nursing, allergic to albumin, have an infection, skin condition, or muscle weakness at the site of the injection, or have myasthenia gravis, Lambert-Eaton syndrome, or ALS.  It is also possible that as with any injection, there may be an allergic reaction or no effect from the medication. Reduced effectiveness after repeated injections is sometimes seen and rarely infection at the injection site may occur. All care will be taken to prevent these side effects. If therapy is given over a long time, atrophy and wasting in the muscle injected may occur. Occasionally the patient's become refractory to treatment because they develop antibodies to the toxin. In this event, therapy needs to be modified.  I have read the above information and consent to the administration of botulism toxin.    BOTOX PROCEDURE NOTE FOR MIGRAINE HEADACHE    Contraindications and precautions discussed with patient(above). Aseptic procedure was observed and patient tolerated procedure. Procedure performed by Dr. Artemio Aly  The condition has existed for more than 6 months, and pt does not have a diagnosis of ALS, Myasthenia Gravis or Lambert-Eaton Syndrome.  Risks and benefits of injections discussed and pt agrees to proceed with the procedure.  Written consent  obtained  These injections are medically necessary. Pt  receives good benefits from these injections. These injections do not cause sedations or hallucinations which the oral therapies may cause.  Description of procedure:  The patient was placed in a sitting position. The standard protocol was used for Botox as  follows, with 5 units of Botox injected at each site:   -Procerus muscle, midline injection  -Corrugator muscle, bilateral injection  -Frontalis muscle, bilateral injection, with 2 sites each side, medial injection was performed in the upper one third of the frontalis muscle, in the region vertical from the medial inferior edge of the superior orbital rim. The lateral injection was again in the upper one third of the forehead vertically above the lateral limbus of the cornea, 1.5 cm lateral to the medial injection site.  - Levator Scapulae: 5 units bilaterally  -Temporalis muscle injection, 5 sites, bilaterally. The first injection was 3 cm above the tragus of the ear, second injection site was 1.5 cm to 3 cm up from the first injection site in line with the tragus of the ear. The third injection site was 1.5-3 cm forward between the first 2 injection sites. The fourth injection site was 1.5 cm posterior to the second injection site. 5th site laterally in the temporalis  muscleat the level of the outer canthus.  - Patient feels her clenching is a trigger for headaches. +5 units masseter bilaterally   - Patient feels the migraines are centered around the eyes +5 units bilaterally at the outer canthus in the orbicularis occuli  -Occipitalis muscle injection, 3 sites, bilaterally. The first injection was done one half way between the occipital protuberance and the tip of the mastoid process behind the ear. The second injection site was done lateral and superior to the first, 1 fingerbreadth from the first injection. The third injection site was 1 fingerbreadth superiorly and medially from the first injection site.  -Cervical paraspinal muscle injection, 2 sites, bilateral knee first injection site was 1 cm from the midline of the cervical spine, 3 cm inferior to the lower border of the occipital protuberance. The second injection site was 1.5 cm superiorly and laterally to the first injection  site.  -Trapezius muscle injection was performed at 3 sites, bilaterally. The first injection site was in the upper trapezius muscle halfway between the inflection point of the neck, and the acromion. The second injection site was one half way between the acromion and the first injection site. The third injection was done between the first injection site and the inflection point of the neck.   Will return for repeat injection in 3 months.   A 200 unit sof Botox was used, any Botox not injected was wasted. The patient tolerated the procedure well, there were no complications of the above procedure.

## 2020-09-18 NOTE — Telephone Encounter (Signed)
Received call from [place] at [place]. Tonya states Botox TBD 2/3 AM.

## 2020-09-18 NOTE — Patient Instructions (Signed)
Access Code: [email or web address] URL: [email or web address] Date: 09/18/2020 Prepared by: Eulis Foster  Exercises Supine Piriformis Stretch with Leg Straight - 1 x daily - 7 x weekly - 1 sets - 2 reps - 30 sec hold Supine Hamstring Stretch with Strap - 1 x daily - 7 x weekly - 1 sets - 2 reps - 30 sec hold Supine Butterfly Groin Stretch - 1 x daily - 7 x weekly - 1 sets - 1 reps - 1 min hold Supine Double Knee to Chest - 1 x daily - 7 x weekly - 1 sets - 2 reps - 30 sec hold [facility] [address], [address] [place], [place] [address] Phone # [phone] Fax [phone]

## 2020-09-19 ENCOUNTER — Ambulatory Visit (INDEPENDENT_AMBULATORY_CARE_PROVIDER_SITE_OTHER): Payer: 59 | Admitting: Neurology

## 2020-09-19 DIAGNOSIS — G43709 Chronic migraine without aura, not intractable, without status migrainosus: Secondary | ICD-10-CM

## 2020-09-19 DIAGNOSIS — R519 Headache, unspecified: Secondary | ICD-10-CM

## 2020-09-19 DIAGNOSIS — S069X0A Unspecified intracranial injury without loss of consciousness, initial encounter: Secondary | ICD-10-CM

## 2020-09-19 DIAGNOSIS — S0285XA Fracture of orbit, unspecified, initial encounter for closed fracture: Secondary | ICD-10-CM

## 2020-09-19 DIAGNOSIS — R42 Dizziness and giddiness: Secondary | ICD-10-CM

## 2020-09-19 MED ORDER — CLONAZEPAM 0.5 MG PO TABS: 0.2500 mg | ORAL_TABLET | Freq: Every evening | ORAL | 1 refills | Status: DC | PRN

## 2020-09-19 MED ORDER — INDOMETHACIN 50 MG PO CAPS: 50.0000 mg | ORAL_CAPSULE | Freq: Three times a day (TID) | ORAL | 11 refills | Status: DC

## 2020-09-19 NOTE — Progress Notes (Signed)
Botox- 100 units x 2 vials Lot: [id number] Expiration:10/2022 NDC: 6837-2902-11  Bacteriostatic 0.9% Sodium Chloride- 71mL total Lot: [id number] Expiration: 09/17/2021 NDC: 0223-3612-24  Dx: S97.530 S/P

## 2020-09-19 NOTE — Telephone Encounter (Signed)
Received (2) 100U vials of Botox today from [facility]. 

## 2020-09-23 ENCOUNTER — Telehealth: Payer: Self-pay | Admitting: Neurology

## 2020-09-23 NOTE — Telephone Encounter (Signed)
[employer] [facility]kley Harvey: [id number] (Exp. 09/23/20 to 11/07/20) order sent to [facility]. They will reach out to the patient to schedule.

## 2020-09-26 ENCOUNTER — Ambulatory Visit: Payer: 59 | Admitting: Physical Therapy

## 2020-09-26 ENCOUNTER — Ambulatory Visit
Admission: RE | Admit: 2020-09-26 | Discharge: 2020-09-26 | Disposition: A | Discharge status: Home / Self Care | Payer: 59 | Source: Ambulatory Visit | Attending: Neurology | Admitting: Neurology

## 2020-09-26 ENCOUNTER — Other Ambulatory Visit: Payer: Self-pay

## 2020-09-26 DIAGNOSIS — R519 Headache, unspecified: Secondary | ICD-10-CM

## 2020-09-26 DIAGNOSIS — S0285XA Fracture of orbit, unspecified, initial encounter for closed fracture: Secondary | ICD-10-CM

## 2020-09-26 DIAGNOSIS — R42 Dizziness and giddiness: Secondary | ICD-10-CM

## 2020-09-26 DIAGNOSIS — S069X0A Unspecified intracranial injury without loss of consciousness, initial encounter: Secondary | ICD-10-CM

## 2020-11-05 ENCOUNTER — Telehealth: Payer: Self-pay | Admitting: Neurology

## 2020-11-05 NOTE — Telephone Encounter (Signed)
Patient's next Botox appointment is 5/5. I received (1) 200 unit vial of Botox today from [facility].

## 2020-11-11 ENCOUNTER — Other Ambulatory Visit: Payer: Self-pay | Admitting: Neurology

## 2020-12-19 ENCOUNTER — Ambulatory Visit (INDEPENDENT_AMBULATORY_CARE_PROVIDER_SITE_OTHER): Payer: 59 | Admitting: Neurology

## 2020-12-19 ENCOUNTER — Other Ambulatory Visit: Payer: Self-pay

## 2020-12-19 DIAGNOSIS — G43709 Chronic migraine without aura, not intractable, without status migrainosus: Secondary | ICD-10-CM

## 2020-12-19 NOTE — Progress Notes (Signed)
Botox- 200 units x 1 vial Lot: [id number] Expiration: 05/2023 NDC: 6579-0383-33  Bacteriostatic 0.9% Sodium Chloride- 70mL total Lot: [id number] Expiration: 01/15/2022 NDC: 1660-6004-59  Dx: X77.414 S/P

## 2020-12-19 NOTE — Progress Notes (Signed)
Consent Form Botulism Toxin Injection For Chronic Migraine 12/19/2020: Stable, still doing well 09/20/2019: still doing great > 70% improvement  06/19/2020: doing great, >70% improvement in migraine and headache frequency, +a.   03/26/2020: : >65% improvement in migraine frequency. Started Gabapentin for migraines, may also help with her other nerve pain and anxiety, she is doing extremely well on the Gabapentin. Also will try Baclofen at bedtime. She is doing better on zanaflex every night. Steroids helped her neck spasms. Gave her some clonazepam to see if that will help, she has dry needling at [facility] PT tomorrow, we also discussed Dr. Stephannie Peters as well as if she needs referral to a counselor I can ask Dr. Lance Coon for referral within her practice. Try Wellburin instead of effexor. She hasn't even had to take a triptan in 2 months she has done so well this time. She has stopped the Ajovy as she does not need it anymore. +a  CT head  No orders of the defined types were placed in this encounter.    Reviewed orally with patient, additionally signature is on file:  Botulism toxin has been approved by the Federal drug administration for treatment of chronic migraine. Botulism toxin does not cure chronic migraine and it may not be effective in some patients.  The administration of botulism toxin is accomplished by injecting a small amount of toxin into the muscles of the neck and head. Dosage must be titrated for each individual. Any benefits resulting from botulism toxin tend to wear off after 3 months with a repeat injection required if benefit is to be maintained. Injections are usually done every 3-4 months with maximum effect peak achieved by about 2 or 3 weeks. Botulism toxin is expensive and you should be sure of what costs you will incur resulting from the injection.  The side effects of botulism toxin use for chronic migraine may include:   -Transient, and usually mild, facial weakness with facial  injections  -Transient, and usually mild, head or neck weakness with head/neck injections  -Reduction or loss of forehead facial animation due to forehead muscle weakness  -Eyelid drooping  -Dry eye  -Pain at the site of injection or bruising at the site of injection  -Double vision  -Potential unknown long term risks  Contraindications: You should not have Botox if you are pregnant, nursing, allergic to albumin, have an infection, skin condition, or muscle weakness at the site of the injection, or have myasthenia gravis, Lambert-Eaton syndrome, or ALS.  It is also possible that as with any injection, there may be an allergic reaction or no effect from the medication. Reduced effectiveness after repeated injections is sometimes seen and rarely infection at the injection site may occur. All care will be taken to prevent these side effects. If therapy is given over a long time, atrophy and wasting in the muscle injected may occur. Occasionally the patient's become refractory to treatment because they develop antibodies to the toxin. In this event, therapy needs to be modified.  I have read the above information and consent to the administration of botulism toxin.    BOTOX PROCEDURE NOTE FOR MIGRAINE HEADACHE    Contraindications and precautions discussed with patient(above). Aseptic procedure was observed and patient tolerated procedure. Procedure performed by Dr. Artemio Aly  The condition has existed for more than 6 months, and pt does not have a diagnosis of ALS, Myasthenia Gravis or Lambert-Eaton Syndrome.  Risks and benefits of injections discussed and pt agrees to proceed with  the procedure.  Written consent obtained  These injections are medically necessary. Pt  receives good benefits from these injections. These injections do not cause sedations or hallucinations which the oral therapies may cause.  Description of procedure:  The patient was placed in a sitting position. The  standard protocol was used for Botox as follows, with 5 units of Botox injected at each site:   -Procerus muscle, midline injection  -Corrugator muscle, bilateral injection  -Frontalis muscle, bilateral injection, with 2 sites each side, medial injection was performed in the upper one third of the frontalis muscle, in the region vertical from the medial inferior edge of the superior orbital rim. The lateral injection was again in the upper one third of the forehead vertically above the lateral limbus of the cornea, 1.5 cm lateral to the medial injection site.  - Levator Scapulae: 5 units bilaterally  -Temporalis muscle injection, 5 sites, bilaterally. The first injection was 3 cm above the tragus of the ear, second injection site was 1.5 cm to 3 cm up from the first injection site in line with the tragus of the ear. The third injection site was 1.5-3 cm forward between the first 2 injection sites. The fourth injection site was 1.5 cm posterior to the second injection site. 5th site laterally in the temporalis  muscleat the level of the outer canthus.  - Patient feels her clenching is a trigger for headaches. +5 units masseter bilaterally   - Patient feels the migraines are centered around the eyes +5 units bilaterally at the outer canthus in the orbicularis occuli  -Occipitalis muscle injection, 3 sites, bilaterally. The first injection was done one half way between the occipital protuberance and the tip of the mastoid process behind the ear. The second injection site was done lateral and superior to the first, 1 fingerbreadth from the first injection. The third injection site was 1 fingerbreadth superiorly and medially from the first injection site.  -Cervical paraspinal muscle injection, 2 sites, bilateral knee first injection site was 1 cm from the midline of the cervical spine, 3 cm inferior to the lower border of the occipital protuberance. The second injection site was 1.5 cm superiorly and  laterally to the first injection site.  -Trapezius muscle injection was performed at 3 sites, bilaterally. The first injection site was in the upper trapezius muscle halfway between the inflection point of the neck, and the acromion. The second injection site was one half way between the acromion and the first injection site. The third injection was done between the first injection site and the inflection point of the neck.   Will return for repeat injection in 3 months.   A 200 unit sof Botox was used, any Botox not injected was wasted. The patient tolerated the procedure well, there were no complications of the above procedure.

## 2020-12-30 NOTE — Progress Notes (Deleted)
30 y.o. G54P0000 Divorced Caucasian female here for annual exam.    PCP:     No LMP recorded. (Menstrual status: Oral contraceptives).           Sexually active: {yes no:314532}  The current method of family planning is OCP (estrogen/progesterone).    Exercising: {yes no:314532}  {types:19826} Smoker:  no  Health Maintenance: Pap: 12-28-19 ASCUS:Neg HR HPV, 2016 normal per patient History of abnormal Pap:  no MMG:  n/a Colonoscopy:  n/a BMD:   n/a  Result  n/a TDaP:  ***Unsure Gardasil:   no HIV: 12-28-19 NR Hep C: 12-28-19 Neg Screening Labs:  Hb today: ***, Urine today: ***   reports that she has never smoked. She has never used smokeless tobacco. She reports current alcohol use. She reports that she does not use drugs.  Past Medical History:  Diagnosis Date  . Anxiety   . Depression   . Dizziness 2020   pt states she attributes this to "sinus pressure & pollen being out of control"  . Dysmenorrhea   . Endometriosis   . Headache   . Migraines    WITH aura  . Ovarian cyst   . Seasonal allergies   . Upper respiratory infection 2019   severe, required chest xrays, Abx, steroids, heart echo  . Vision abnormalities     Past Surgical History:  Procedure Laterality Date  . LAPAROSCOPY ABDOMEN DIAGNOSTIC    . laporoscopy    . PELVIC LAPAROSCOPY  2016   Dx'd endometriosis  . TONSILLECTOMY      Current Outpatient Medications  Medication Sig Dispense Refill  . almotriptan (AXERT) 12.5 MG tablet TAKE 1 TABLET BY MOUTH AS NEEDED FOR MIGRAINE. MAY REPEAT IN 2 HOURS IF NEEDED 10 tablet 11  . botulinum toxin Type A (BOTOX) 100 units SOLR injection INJECT 155 UNITS  INTRAMUSCULARLY EVERY 3  MONTHS (GIVEN AT MD OFFICE, DISCARD UNUSED) 2 each 1  . buPROPion (WELLBUTRIN XL) 150 MG 24 hr tablet TAKE 1 TABLET BY MOUTH EVERY DAY IN THE MORNING 90 tablet 3  . busPIRone (BUSPAR) 30 MG tablet Take 30 mg by mouth daily.    . butalbital-acetaminophen-caffeine (FIORICET, ESGIC) 50-325-40  MG tablet Take 1-2 tabs every 6 hours as needed for headache 30 tablet 3  . cetirizine (ZYRTEC) 10 MG tablet Take 10 mg by mouth daily.    . clonazePAM (KLONOPIN) 0.5 MG tablet Take 0.5-1 tablets (0.25-0.5 mg total) by mouth at bedtime as needed for anxiety. 15 tablet 1  . cyclobenzaprine (FLEXERIL) 10 MG tablet Take 1 tablet (10 mg total) by mouth at bedtime. 90 tablet 3  . gabapentin (NEURONTIN) 100 MG capsule TAKE 1 CAPSULE BY MOUTH THREE TIMES A DAY. 270 capsule 3  . indomethacin (INDOCIN) 50 MG capsule Take 1 capsule (50 mg total) by mouth 3 (three) times daily with meals. 60 capsule 11  . JUNEL 1/20 1-20 MG-MCG tablet TAKE 1 TABLET BY MOUTH EVERY DAY 84 tablet 1  . montelukast (SINGULAIR) 10 MG tablet     . ondansetron (ZOFRAN) 8 MG tablet TAKE 1 TABLET BY MOUTH EVERY 8 HOURS AS NEEDED FOR NAUSEA OR VOMITING. 60 tablet 3  . spironolactone (ALDACTONE) 25 MG tablet Take 25 mg by mouth daily.    . SUMAtriptan (TOSYMRA) 10 MG/ACT SOLN Place 10 mg into the nose every hour. Maximum 3 doses per day. 6 each 6  . tiZANidine (ZANAFLEX) 4 MG tablet TAKE 1 TABLET (4 MG TOTAL) BY MOUTH EVERY 6 (SIX)  HOURS AS NEEDED FOR MUSCLE SPASMS. 30 tablet 11  . Topiramate ER (TROKENDI XR) 50 MG CP24 Take 100 mg by mouth at bedtime. (Patient taking differently: Take 150 mg by mouth at bedtime.) 30 capsule 0   No current facility-administered medications for this visit.    Family History  Problem Relation Age of Onset  . Hyperthyroidism Mother   . Thyroid disease Mother   . Hypertension Father   . Bipolar disorder Father   . Migraines Father   . Healthy Brother   . Hyperlipidemia Maternal Grandmother   . Stroke Maternal Grandmother   . Hyperlipidemia Maternal Grandfather     Review of Systems  Exam:   There were no vitals taken for this visit.    General appearance: alert, cooperative and appears stated age Head: normocephalic, without obvious abnormality, atraumatic Neck: no adenopathy, supple,  symmetrical, trachea midline and thyroid normal to inspection and palpation Lungs: clear to auscultation bilaterally Breasts: normal appearance, no masses or tenderness, No nipple retraction or dimpling, No nipple discharge or bleeding, No axillary adenopathy Heart: regular rate and rhythm Abdomen: soft, non-tender; no masses, no organomegaly Extremities: extremities normal, atraumatic, no cyanosis or edema Skin: skin color, texture, turgor normal. No rashes or lesions Lymph nodes: cervical, supraclavicular, and axillary nodes normal. Neurologic: grossly normal  Pelvic: External genitalia:  no lesions              No abnormal inguinal nodes palpated.              Urethra:  normal appearing urethra with no masses, tenderness or lesions              Bartholins and Skenes: normal                 Vagina: normal appearing vagina with normal color and discharge, no lesions              Cervix: no lesions              Pap taken: {yes no:314532} Bimanual Exam:  Uterus:  normal size, contour, position, consistency, mobility, non-tender              Adnexa: no mass, fullness, tenderness              Rectal exam: {yes no:314532}.  Confirms.              Anus:  normal sphincter tone, no lesions  Chaperone was present for exam.  Assessment:   Well woman visit with normal exam.   Plan: Mammogram screening discussed. Self breast awareness reviewed. Pap and HR HPV as above. Guidelines for Calcium, Vitamin D, regular exercise program including cardiovascular and weight bearing exercise.   Follow up annually and prn.   Additional counseling given.  {yes [id number]. _______ minutes face to face time of which over 50% was spent in counseling.    After visit summary provided.

## 2020-12-31 ENCOUNTER — Ambulatory Visit: Payer: 59 | Admitting: Obstetrics and Gynecology

## 2020-12-31 DIAGNOSIS — Z0289 Encounter for other administrative examinations: Secondary | ICD-10-CM

## 2021-02-12 ENCOUNTER — Encounter: Payer: Self-pay | Admitting: Neurology

## 2021-02-12 ENCOUNTER — Other Ambulatory Visit: Payer: Self-pay

## 2021-02-12 ENCOUNTER — Ambulatory Visit (INDEPENDENT_AMBULATORY_CARE_PROVIDER_SITE_OTHER): Payer: 59 | Admitting: Neurology

## 2021-02-12 VITALS — BP 118/81 | HR 96 | Ht 62.75 in | Wt 154.0 lb

## 2021-02-12 DIAGNOSIS — Z87820 Personal history of traumatic brain injury: Secondary | ICD-10-CM | POA: Diagnosis not present

## 2021-02-12 DIAGNOSIS — F0781 Postconcussional syndrome: Secondary | ICD-10-CM | POA: Diagnosis not present
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
## 2021-02-12 NOTE — Patient Instructions (Addendum)
Formal memory testing Blood work See if you can stop/change Topamax and gabapentin (used for the chronic pain endometriosis) Discuss with Therapist a psychiatrist who can prescribe something for anxiety/PTSD - possibly SSRI or something other than Wellbutrin Stop Clonazepam  Use Flexeril and tizanidine sparingly   Post-Concussion Syndrome  A concussion is a brain injury from a direct hit to the head or body. This hit causes the brain to shake quickly back and forth inside the skull. This can damage brain cells and cause chemical changes in the brain. Concussions areusually not life-threatening but can cause serious symptoms. Post-concussion syndrome is when symptoms that occur after a concussion lastlonger than normal. These symptoms can last from weeks to months. What are the causes? The cause of this condition is not known. It can happen whether your headinjury was mild or severe. What increases the risk? You are more likely to develop this condition if: You are female. You are a child, teen, or young adult. You have had a past head injury. You have a history of headaches. You have depression or anxiety. You have loss of consciousness or cannot remember the event (have amnesia of the event). You have multiple symptoms or severe symptoms at the time of your concussion. What are the signs or symptoms? Symptoms of this condition include: Physical symptoms. You may have: Headaches. Tiredness. Dizziness and weakness. Blurry vision and sensitivity to light. Hearing difficulties. Problems with balance. Mental and emotional symptoms. You may have: Memory problems and trouble concentrating. Difficulty sleeping or staying asleep. Feelings of irritability. Anxiety or depression. Difficulty learning new things. How is this diagnosed? This condition may be diagnosed based on: Your symptoms. A description of your injury. Your medical history. Testing your strength, balance, and nerve  function (neurological examination). Your health care provider may order other tests, including brain imaging such as a CT scan or an MRI, and memory testing (neuropsychological testing). How is this treated? Treatment for this condition may depend on your symptoms. Symptoms usually go away on their own over time. Treatments may include: Medicines for headaches, anxiety, depression, and trouble sleeping (insomnia). Resting your brain and body for a few days after your injury. Rehabilitation therapy, such as: Physical or occupational therapy. This may include exercises to help with balance and dizziness. Mental health counseling. A form of talk therapy called cognitive behavioral therapy (CBT) can be especially helpful. This therapy helps you set goals and follow up on the changes that you make. Speech therapy. Vision therapy. A brain and eye specialist can recommend treatments for vision problems. Follow these instructions at home: Medicines Take over-the-counter and prescription medicines only as told by your health care provider. Avoid opioid prescription pain medicines when recovering from a concussion. Activity Limit your mental activities for the first few days after your injury. This may include not doing the following: Homework or job-related work. Complex thinking. Watching TV, and using a computer or phone. Playing memory games and puzzles. Gradually return to your normal activity level. If a certain activity brings on your symptoms, stop or slow down until you can do the activity without it triggering your symptoms. Limit physical activity, such as exercise or sports, for the first few days after a concussion. Gradually return to normal activity as told by your health care provider. Rest. Rest helps your brain heal. Make sure you: Get plenty of sleep at night. Most adults should get at least 7-9 hours of sleep each night. Rest during the day. Take naps or rest  breaks when you feel  tired. Do not do high-risk activities that could cause a second concussion, such as riding a bike or playing sports. Having another concussion before the first one has healed can be dangerous. General instructions  Do not drink alcohol until your health care provider says that you can. Keep track of the frequency and the severity of your symptoms. Give this information to your health care provider. Keep all follow-up visits as directed by your health care provider. This is important. This includes visits with specialists.  Contact a health care provider if: Your symptoms do not improve. You have another injury. Get help right away if you: Have a severe or worsening headache. Are confused. Have trouble staying awake. Faint. Vomit. Have weakness or numbness in any part of your body. Have a seizure. Have trouble speaking. Summary Post-concussion syndrome is when symptoms that occur after a concussion last longer than normal. Symptoms usually go away on their own over time. Depending on your symptoms, you may need treatment, such as medicines or rehabilitation therapy. Rest your brain and body for a few days after your injury. Gradually return to normal activities as told by your health care provider. Get plenty of sleep, and avoid alcohol and opioid pain medicines while recovering from a concussion. This information is not intended to replace advice given to you by your health care provider. Make sure you discuss any questions you have with your healthcare provider. Document Revised: 07/26/2019 Document Reviewed: 07/26/2019 Elsevier Patient Education  2022 Elsevier Inc.   Memory Compensation Strategies  Use "WARM" strategy.  W= write it down  A= associate it  R= repeat it  M= make a mental note  2.   You can keep a Glass blower/designer.  Use a 3-ring notebook with sections for the following: calendar, important names and phone numbers,  medications, doctors' names/phone numbers,  lists/reminders, and a section to journal what you did  each day.   3.    Use a calendar to write appointments down.  4.    Write yourself a schedule for the day.  This can be placed on the calendar or in a separate section of the Memory Notebook.  Keeping a  regular schedule can help memory.  5.    Use medication organizer with sections for each day or morning/evening pills.  You may need help loading it  6.    Keep a basket, or pegboard by the door.  Place items that you need to take out with you in the basket or on the pegboard.  You may also want to  include a message board for reminders.  7.    Use sticky notes.  Place sticky notes with reminders in a place where the task is performed.  For example: " turn off the  stove" placed by the stove, "lock the door" placed on the door at eye level, " take your medications" on  the bathroom mirror or by the place where you normally take your medications.  8.    Use alarms/timers.  Use while cooking to remind yourself to check on food or as a reminder to take your medicine, or as a  reminder to make a call, or as a reminder to perform another task, etc.

## 2021-02-14 ENCOUNTER — Encounter: Payer: Self-pay | Admitting: Psychology

## 2021-02-18 LAB — COMPREHENSIVE METABOLIC PANEL
ALT: 22 IU/L (ref 0–32)
AST: 18 IU/L (ref 0–40)
Albumin/Globulin Ratio: 1.8 (ref 1.2–2.2)
Albumin: 4.9 g/dL — ABNORMAL HIGH (ref 3.8–4.8)
Alkaline Phosphatase: 65 IU/L (ref 44–121)
BUN/Creatinine Ratio: 9 (ref 9–23)
BUN: 9 mg/dL (ref 6–20)
Bilirubin Total: 0.4 mg/dL (ref 0.0–1.2)
CO2: 17 mmol/L — ABNORMAL LOW (ref 20–29)
Calcium: 9.6 mg/dL (ref 8.7–10.2)
Chloride: 105 mmol/L (ref 96–106)
Creatinine, Ser: 1.03 mg/dL — ABNORMAL HIGH (ref 0.57–1.00)
Globulin, Total: 2.7 g/dL (ref 1.5–4.5)
Glucose: 76 mg/dL (ref 65–99)
Potassium: 4.1 mmol/L (ref 3.5–5.2)
Sodium: 138 mmol/L (ref 134–144)
Total Protein: 7.6 g/dL (ref 6.0–8.5)
eGFR: 75 mL/min/{1.73_m2} (ref >59)

## 2021-02-18 LAB — CBC WITH DIFFERENTIAL/PLATELET
Basophils Absolute: 0.1 10*3/uL (ref 0.0–0.2)
Basos: 1 %
EOS (ABSOLUTE): 0.1 10*3/uL (ref 0.0–0.4)
Eos: 1 %
Hematocrit: 42.5 % (ref 34.0–46.6)
Hemoglobin: 14.2 g/dL (ref 11.1–15.9)
Immature Grans (Abs): 0 10*3/uL (ref 0.0–0.1)
Immature Granulocytes: 0 %
Lymphocytes Absolute: 4.4 10*3/uL — ABNORMAL HIGH (ref 0.7–3.1)
Lymphs: 51 %
MCH: 31.1 pg (ref 26.6–33.0)
MCHC: 33.4 g/dL (ref 31.5–35.7)
MCV: 93 fL (ref 79–97)
Monocytes Absolute: 0.4 10*3/uL (ref 0.1–0.9)
Monocytes: 5 %
Neutrophils Absolute: 3.5 10*3/uL (ref 1.4–7.0)
Neutrophils: 42 %
Platelets: 375 10*3/uL (ref 150–450)
RBC: 4.56 x10E6/uL (ref 3.77–5.28)
RDW: 11.7 % (ref 11.7–15.4)
WBC: 8.4 10*3/uL (ref 3.4–10.8)

## 2021-02-18 LAB — B12 AND FOLATE PANEL
Folate: 6.9 ng/mL (ref >3.0)
Vitamin B-12: 402 pg/mL (ref 232–1245)

## 2021-02-18 LAB — VITAMIN B1: Thiamine: 156.7 nmol/L (ref 66.5–200.0)

## 2021-02-18 LAB — TSH: TSH: 1.85 u[IU]/mL (ref 0.450–4.500)

## 2021-02-18 LAB — METHYLMALONIC ACID, SERUM: Methylmalonic Acid: 128 nmol/L (ref 0–378)

## 2021-02-18 LAB — SEDIMENTATION RATE: Sed Rate: 5 mm/hr (ref 0–32)

## 2021-02-19 ENCOUNTER — Telehealth: Payer: Self-pay | Admitting: *Deleted

## 2021-02-19 NOTE — Telephone Encounter (Signed)
-----   Message from Anson Fret, MD sent at 02/13/2021  9:14 AM EDT ----- Blood work so far unremarkable. Still awaiting a few to come back, if they come back abnormal we will let her know thanks

## 2021-02-25 ENCOUNTER — Telehealth: Payer: Self-pay | Admitting: Neurology

## 2021-02-25 NOTE — Telephone Encounter (Signed)
Patient's next Botox appointment is 8/9. I received (2) 100 unit vials of Botox today from [facility].

## 2021-03-10 ENCOUNTER — Telehealth: Payer: Self-pay | Admitting: Neurology

## 2021-03-10 NOTE — Telephone Encounter (Signed)
Pt states she woke up with a migraine, she'd like to know if she can come in for a headache infusion, please call

## 2021-03-10 NOTE — Telephone Encounter (Signed)
Orders placed per Dr Lucia Gaskins for Depacon 1 gram IV x 1, Toradol 30 mg IV x 1, and Solumedrol 125 mg IV x 1. Orders signed by MD and given to Loralyn Freshwater RN w/ [facility].

## 2021-03-10 NOTE — Telephone Encounter (Signed)
Per Dr. Lucia Gaskins, okay for infusion.Spoke with patient.  She states the headache started yesterday afternoon.  She tried her first line of medication and had to repeat before bed last night.  She woke up twice in the night from the pain.  She has used rizatriptan, Zofran, Benadryl, part of a muscle relaxer.  She states the pain creeped up the back of her neck and has lodged itself behind her eye. She states this is the first headache she has had in 1 year that required Rizatriptan.  She denies any new or concerning symptoms. This is her typical headache/migraine.  Discussed migraine infusion.  Patient will come in around 1215 today with plans to give Depacon, possibly steroids and Toradol.  She will not have a driver. Allergies reconciled.  Patient did not have any concerns and verbalized appreciation.

## 2021-03-11 ENCOUNTER — Other Ambulatory Visit: Payer: Self-pay | Admitting: Neurology

## 2021-03-24 NOTE — Telephone Encounter (Signed)
Patient's insurance through [employer] now requires PA for Botox. I called [facility] Guidance program @ [phone] and spoke to [place] to obtain PA. Dx [phone]. Request was approved. PA- [id number] (03/24/21- 08/24/21).

## 2021-03-25 ENCOUNTER — Ambulatory Visit (INDEPENDENT_AMBULATORY_CARE_PROVIDER_SITE_OTHER): Payer: 59 | Admitting: Neurology

## 2021-03-25 DIAGNOSIS — G43709 Chronic migraine without aura, not intractable, without status migrainosus: Secondary | ICD-10-CM

## 2021-03-25 NOTE — Progress Notes (Signed)
Botox- 200 units x 1 vial Lot: [id number] Expiration: 01/2023 NDC: 2060-1561-53  Bacteriostatic 0.9% Sodium Chloride- 67mL total Lot: [id number] Expiration: 08/17/2022 NDC: 6147-0929-57  Dx: M73.403 S/P

## 2021-03-25 NOTE — Progress Notes (Signed)
Consent Form Botulism Toxin Injection For Chronic Migraine  03/25/2021: Stable, still doing well. +5 each orb oculi. +10u each masseter. +5units each at th temples where you can feel temporalis bulge out when she clenches. Also she has a lot neck tightness ask her to point out the trouble spots to you and spread the rest of the botox around cervical spinal and trap muscles.  12/19/2020: Stable, still doing well 09/20/2019: still doing great > 70% improvement  06/19/2020: doing great, >70% improvement in migraine and headache frequency, +a.   03/26/2020: : >65% improvement in migraine frequency. Started Gabapentin for migraines, may also help with her other nerve pain and anxiety, she is doing extremely well on the Gabapentin. Also will try Baclofen at bedtime. She is doing better on zanaflex every night. Steroids helped her neck spasms. Gave her some clonazepam to see if that will help, she has dry needling at [facility] tomorrow, we also discussed Dr. Stephannie Peters as well as if she needs referral to a counselor I can ask Dr. Lance Coon for referral within her practice. Try Wellburin instead of effexor. She hasn't even had to take a triptan in 2 months she has done so well this time. She has stopped the Ajovy as she does not need it anymore. +a  CT head  No orders of the defined types were placed in this encounter.    Reviewed orally with patient, additionally signature is on file:  Botulism toxin has been approved by the Federal drug administration for treatment of chronic migraine. Botulism toxin does not cure chronic migraine and it may not be effective in some patients.  The administration of botulism toxin is accomplished by injecting a small amount of toxin into the muscles of the neck and head. Dosage must be titrated for each individual. Any benefits resulting from botulism toxin tend to wear off after 3 months with a repeat injection required if benefit is to be maintained. Injections are usually done  every 3-4 months with maximum effect peak achieved by about 2 or 3 weeks. Botulism toxin is expensive and you should be sure of what costs you will incur resulting from the injection.  The side effects of botulism toxin use for chronic migraine may include:   -Transient, and usually mild, facial weakness with facial injections  -Transient, and usually mild, head or neck weakness with head/neck injections  -Reduction or loss of forehead facial animation due to forehead muscle weakness  -Eyelid drooping  -Dry eye  -Pain at the site of injection or bruising at the site of injection  -Double vision  -Potential unknown long term risks  Contraindications: You should not have Botox if you are pregnant, nursing, allergic to albumin, have an infection, skin condition, or muscle weakness at the site of the injection, or have myasthenia gravis, Lambert-Eaton syndrome, or ALS.  It is also possible that as with any injection, there may be an allergic reaction or no effect from the medication. Reduced effectiveness after repeated injections is sometimes seen and rarely infection at the injection site may occur. All care will be taken to prevent these side effects. If therapy is given over a long time, atrophy and wasting in the muscle injected may occur. Occasionally the patient's become refractory to treatment because they develop antibodies to the toxin. In this event, therapy needs to be modified.  I have read the above information and consent to the administration of botulism toxin.    BOTOX PROCEDURE NOTE FOR MIGRAINE HEADACHE  Contraindications and precautions discussed with patient(above). Aseptic procedure was observed and patient tolerated procedure. Procedure performed by Dr. Artemio Aly  The condition has existed for more than 6 months, and pt does not have a diagnosis of ALS, Myasthenia Gravis or Lambert-Eaton Syndrome.  Risks and benefits of injections discussed and pt agrees to proceed  with the procedure.  Written consent obtained  These injections are medically necessary. Pt  receives good benefits from these injections. These injections do not cause sedations or hallucinations which the oral therapies may cause.  Description of procedure:  The patient was placed in a sitting position. The standard protocol was used for Botox as follows, with 5 units of Botox injected at each site:   -Procerus muscle, midline injection  -Corrugator muscle, bilateral injection  -Frontalis muscle, bilateral injection, with 2 sites each side, medial injection was performed in the upper one third of the frontalis muscle, in the region vertical from the medial inferior edge of the superior orbital rim. The lateral injection was again in the upper one third of the forehead vertically above the lateral limbus of the cornea, 1.5 cm lateral to the medial injection site.  - Levator Scapulae: 5 units bilaterally  -Temporalis muscle injection, 5 sites, bilaterally. The first injection was 3 cm above the tragus of the ear, second injection site was 1.5 cm to 3 cm up from the first injection site in line with the tragus of the ear. The third injection site was 1.5-3 cm forward between the first 2 injection sites. The fourth injection site was 1.5 cm posterior to the second injection site. 5th site laterally in the temporalis  muscleat the level of the outer canthus.  - Patient feels her clenching is a trigger for headaches. +5 units masseter bilaterally   - Patient feels the migraines are centered around the eyes +5 units bilaterally at the outer canthus in the orbicularis occuli  -Occipitalis muscle injection, 3 sites, bilaterally. The first injection was done one half way between the occipital protuberance and the tip of the mastoid process behind the ear. The second injection site was done lateral and superior to the first, 1 fingerbreadth from the first injection. The third injection site was 1  fingerbreadth superiorly and medially from the first injection site.  -Cervical paraspinal muscle injection, 2 sites, bilateral knee first injection site was 1 cm from the midline of the cervical spine, 3 cm inferior to the lower border of the occipital protuberance. The second injection site was 1.5 cm superiorly and laterally to the first injection site.  -Trapezius muscle injection was performed at 3 sites, bilaterally. The first injection site was in the upper trapezius muscle halfway between the inflection point of the neck, and the acromion. The second injection site was one half way between the acromion and the first injection site. The third injection was done between the first injection site and the inflection point of the neck.   Will return for repeat injection in 3 months.   A 200 unit sof Botox was used, any Botox not injected was wasted. The patient tolerated the procedure well, there were no complications of the above procedure.

## 2021-04-11 ENCOUNTER — Other Ambulatory Visit: Payer: Self-pay

## 2021-04-11 ENCOUNTER — Emergency Department (HOSPITAL_COMMUNITY): Payer: 59

## 2021-04-11 ENCOUNTER — Emergency Department (HOSPITAL_COMMUNITY)
Admission: EM | Admit: 2021-04-11 | Discharge: 2021-04-12 | Disposition: A | Discharge status: Home / Self Care | Payer: 59 | Attending: Emergency Medicine | Admitting: Emergency Medicine

## 2021-04-11 DIAGNOSIS — R000 Tachycardia, unspecified: Secondary | ICD-10-CM | POA: Insufficient documentation

## 2021-04-11 DIAGNOSIS — N9489 Other specified conditions associated with female genital organs and menstrual cycle: Secondary | ICD-10-CM | POA: Insufficient documentation

## 2021-04-11 DIAGNOSIS — R002 Palpitations: Secondary | ICD-10-CM

## 2021-04-11 DIAGNOSIS — R110 Nausea: Secondary | ICD-10-CM | POA: Diagnosis not present

## 2021-04-11 DIAGNOSIS — Z79899 Other long term (current) drug therapy: Secondary | ICD-10-CM | POA: Diagnosis not present

## 2021-04-11 DIAGNOSIS — Z20822 Contact with and (suspected) exposure to covid-19: Secondary | ICD-10-CM | POA: Insufficient documentation

## 2021-04-11 DIAGNOSIS — Z91040 Latex allergy status: Secondary | ICD-10-CM | POA: Insufficient documentation

## 2021-04-11 LAB — CBC
HCT: 40 % (ref 36.0–46.0)
Hemoglobin: 13.4 g/dL (ref 12.0–15.0)
MCH: 32.4 pg (ref 26.0–34.0)
MCHC: 33.5 g/dL (ref 30.0–36.0)
MCV: 96.6 fL (ref 80.0–100.0)
Platelets: 188 10*3/uL (ref 150–400)
RBC: 4.14 MIL/uL (ref 3.87–5.11)
RDW: 12.6 % (ref 11.5–15.5)
WBC: 9 10*3/uL (ref 4.0–10.5)
nRBC: 0 % (ref 0.0–0.2)

## 2021-04-11 LAB — RESP PANEL BY RT-PCR (FLU A&B, COVID) ARPGX2
Influenza A by PCR: NEGATIVE
Influenza B by PCR: NEGATIVE
SARS Coronavirus 2 by RT PCR: NEGATIVE

## 2021-04-11 LAB — BASIC METABOLIC PANEL
Anion gap: 5 (ref 5–15)
BUN: 12 mg/dL (ref 6–20)
CO2: 19 mmol/L — ABNORMAL LOW (ref 22–32)
Calcium: 8.6 mg/dL — ABNORMAL LOW (ref 8.9–10.3)
Chloride: 113 mmol/L — ABNORMAL HIGH (ref 98–111)
Creatinine, Ser: 0.95 mg/dL (ref 0.44–1.00)
GFR, Estimated: >60 mL/min (ref >60)
Glucose, Bld: 123 mg/dL — ABNORMAL HIGH (ref 70–99)
Potassium: 3.5 mmol/L (ref 3.5–5.1)
Sodium: 137 mmol/L (ref 135–145)

## 2021-04-11 LAB — MAGNESIUM: Magnesium: 2.1 mg/dL (ref 1.7–2.4)

## 2021-04-11 LAB — TSH: TSH: 2.26 u[IU]/mL (ref 0.350–4.500)

## 2021-04-11 LAB — I-STAT BETA HCG BLOOD, ED (MC, WL, AP ONLY): I-stat hCG, quantitative: <5 m[IU]/mL (ref <5)

## 2021-04-11 MED ORDER — SODIUM CHLORIDE 0.9 % IV BOLUS
1000.0000 mL | Freq: Once | INTRAVENOUS | Status: AC
  Administered 2021-04-11: 1000 mL via INTRAVENOUS

## 2021-04-11 MED ORDER — SODIUM CHLORIDE 0.9 % IV SOLN: INTRAVENOUS | Status: DC

## 2021-04-11 MED ORDER — ONDANSETRON HCL 4 MG/2ML IJ SOLN
4.0000 mg | Freq: Once | INTRAMUSCULAR | Status: AC
  Administered 2021-04-11: 4 mg via INTRAVENOUS
  Filled 2021-04-11: qty 2

## 2021-04-11 MED ORDER — METOPROLOL TARTRATE 5 MG/5ML IV SOLN
5.0000 mg | Freq: Once | INTRAVENOUS | Status: AC
  Administered 2021-04-11: 5 mg via INTRAVENOUS
  Filled 2021-04-11: qty 5

## 2021-04-11 NOTE — ED Triage Notes (Signed)
Pt BIBA via [facility] from home. Patient endorsed feeling racing heart, N/V, patient checked HR on watch at home with 170pm. Per medic, patient has hx of anxiety. Pt also endorsed dyspnea with exertion. Pt A&Ox4 with IV access to right AC. No meds given PTA.

## 2021-04-11 NOTE — ED Provider Notes (Signed)
[facility] EMERGENCY DEPARTMENT Provider Note   CSN: [id number] Arrival date & time: 04/11/21  2005     History No chief complaint on file.   Destiny Ellis is a 30 y.o. female.  Pt presents to the ED today with a racing heart.  Pt said she was sitting on the couch after dinner.  She felt calm.  She then felt really cold and nauseous.  Her watch alarmed and said her HR was 170s.  She called EMS.  When EMS arrived, her HR was in the 150s.  An IV was started, but no meds given.  HR now 115.  Pt still feels cold and does not feel right.  No med changes.  No otc supplements.       Past Medical History:  Diagnosis Date   Anxiety    Depression    Dizziness 2020   pt states she attributes this to "sinus pressure & pollen being out of control"   Dysmenorrhea    Endometriosis    Headache    Migraines    WITH aura   Ovarian cyst    Seasonal allergies    Upper respiratory infection 2019   severe, required chest xrays, Abx, steroids, heart echo   Vision abnormalities     Patient Active Problem List   Diagnosis Date Noted   Post concussion syndrome 02/12/2021   H/O multiple concussions 02/12/2021   Neck pain 11/30/2018   Chronic migraine without aura without status migrainosus, not intractable 03/13/2017    Past Surgical History:  Procedure Laterality Date   LAPAROSCOPY ABDOMEN DIAGNOSTIC     laporoscopy     PELVIC LAPAROSCOPY  2016   Dx'd endometriosis   TONSILLECTOMY       OB History     Gravida  0   Para  0   Term  0   Preterm  0   AB  0   Living  0      SAB  0   IAB  0   Ectopic  0   Multiple  0   Live Births  0           Family History  Problem Relation Age of Onset   Hyperthyroidism Mother    Thyroid disease Mother    Hypertension Father    Bipolar disorder Father    Migraines Father    Healthy Brother    Hyperlipidemia Maternal Grandmother    Stroke Maternal Grandmother    Hyperlipidemia Maternal Grandfather      Social History   Tobacco Use   Smoking status: Never   Smokeless tobacco: Never  Vaping Use   Vaping Use: Never used  Substance Use Topics   Alcohol use: Yes    Comment: occasional   Drug use: Never    Home Medications Prior to Admission medications   Medication Sig Start Date End Date Taking? Authorizing Provider  almotriptan (AXERT) 12.5 MG tablet TAKE 1 TABLET BY MOUTH AS NEEDED FOR MIGRAINE. MAY REPEAT IN 2 HOURS IF NEEDED 03/11/21   Anson Fret, MD  botulinum toxin Type A (BOTOX) 100 units SOLR injection INJECT 155 UNITS  INTRAMUSCULARLY EVERY 3  MONTHS (GIVEN AT MD OFFICE, DISCARD UNUSED) 11/11/20   Anson Fret, MD  buPROPion (WELLBUTRIN XL) 150 MG 24 hr tablet TAKE 1 TABLET BY MOUTH EVERY DAY IN THE MORNING 07/08/20   Anson Fret, MD  butalbital-acetaminophen-caffeine (FIORICET, ESGIC) [phone] MG tablet Take 1-2 tabs every 6 hours as  needed for headache 03/02/18   Anson Fret, MD  cetirizine (ZYRTEC) 10 MG tablet Take 10 mg by mouth daily.    [provider]  gabapentin (NEURONTIN) 100 MG capsule TAKE 1 CAPSULE BY MOUTH THREE TIMES A DAY. Patient not taking: Reported on 03/25/2021 06/19/20   Anson Fret, MD  indomethacin (INDOCIN) 50 MG capsule Take 1 capsule (50 mg total) by mouth 3 (three) times daily with meals. 09/19/20   Anson Fret, MD  JUNEL 1/20 1-20 MG-MCG tablet TAKE 1 TABLET BY MOUTH EVERY DAY 11/07/19   Anson Fret, MD  montelukast (SINGULAIR) 10 MG tablet  07/28/16   [provider]  ondansetron (ZOFRAN) 8 MG tablet TAKE 1 TABLET BY MOUTH EVERY 8 HOURS AS NEEDED FOR NAUSEA OR VOMITING. 06/19/20   Anson Fret, MD  spironolactone (ALDACTONE) 25 MG tablet Take 25 mg by mouth daily.    [provider]  SUMAtriptan (TOSYMRA) 10 MG/ACT SOLN Place 10 mg into the nose every hour. Maximum 3 doses per day. 09/21/18   Anson Fret, MD  Topiramate ER (TROKENDI XR) 50 MG CP24 Take 100 mg by mouth at  bedtime. Patient taking differently: Take 150 mg by mouth at bedtime. 03/02/18   Anson Fret, MD    Allergies    Latex, Other, Sulfa antibiotics, and Sulfa antibiotics  Review of Systems   Review of Systems  Cardiovascular:  Positive for palpitations.  All other systems reviewed and are negative.  Physical Exam Updated Vital Signs BP 107/69   Pulse 86   Temp 98.2 F (36.8 C) (Oral)   Resp 16   Ht 5\' 2"  (1.575 m)   Wt 70.3 kg   SpO2 98%   BMI 28.35 kg/m   Physical Exam Vitals and nursing note reviewed.  Constitutional:      Appearance: Normal appearance.  HENT:     Head: Normocephalic and atraumatic.     Right Ear: External ear normal.     Left Ear: External ear normal.     Nose: Nose normal.  Eyes:     Extraocular Movements: Extraocular movements intact.     Conjunctiva/sclera: Conjunctivae normal.     Pupils: Pupils are equal, round, and reactive to light.  Cardiovascular:     Rate and Rhythm: Regular rhythm. Tachycardia present.     Pulses: Normal pulses.     Heart sounds: Normal heart sounds.  Pulmonary:     Effort: Pulmonary effort is normal.     Breath sounds: Normal breath sounds.  Abdominal:     General: Abdomen is flat. Bowel sounds are normal.     Palpations: Abdomen is soft.  Musculoskeletal:        General: Normal range of motion.     Cervical back: Normal range of motion and neck supple.  Skin:    General: Skin is warm.     Capillary Refill: Capillary refill takes less than 2 seconds.  Neurological:     General: No focal deficit present.     Mental Status: She is alert and oriented to person, place, and time.  Psychiatric:        Mood and Affect: Mood normal.        Behavior: Behavior normal.    ED Results / Procedures / Treatments   Labs (all labs ordered are listed, but only abnormal results are displayed) Labs Reviewed  BASIC METABOLIC PANEL - Abnormal; Notable for the following components:      Result  Value   Chloride 113 (*)     CO2 19 (*)    Glucose, Bld 123 (*)    Calcium 8.6 (*)    All other components within normal limits  RESP PANEL BY RT-PCR (FLU A&B, COVID) ARPGX2  MAGNESIUM  CBC  TSH  URINALYSIS, ROUTINE W REFLEX MICROSCOPIC  RAPID URINE DRUG SCREEN, HOSP PERFORMED  I-STAT BETA HCG BLOOD, ED ([facility], [facility], [facility] ONLY)    EKG EKG Interpretation  Date/Time:  Friday April 11 2021 20:11:31 EDT Ventricular Rate:  115 PR Interval:  161 QRS Duration: 82 QT Interval:  316 QTC Calculation: 437 R Axis:   77 Text Interpretation: Sinus tachycardia No old tracing to compare Confirmed by Jacalyn Lefevre [phone]) on 04/11/2021 8:33:35 PM  Radiology DG Chest Port 1 View  Result Date: 04/11/2021 CLINICAL DATA:  Chest pain and nausea vomiting. EXAM: PORTABLE CHEST 1 VIEW COMPARISON:  None. FINDINGS: The heart and mediastinal contours are within normal limits. No focal consolidation. No pulmonary edema. No pleural effusion. No pneumothorax. No acute osseous abnormality. IMPRESSION: No active disease. Electronically Signed   By: Tish Frederickson M.D.   On: 04/11/2021 20:51    Procedures Procedures   Medications Ordered in ED Medications  sodium chloride 0.9 % bolus 1,000 mL (0 mLs Intravenous Stopped 04/11/21 2245)    And  0.9 %  sodium chloride infusion (has no administration in time range)  metoprolol tartrate (LOPRESSOR) injection 5 mg (5 mg Intravenous Given 04/11/21 2100)  sodium chloride 0.9 % bolus 1,000 mL (1,000 mLs Intravenous New Bag/Given 04/11/21 2258)  ondansetron (ZOFRAN) injection 4 mg (4 mg Intravenous Given 04/11/21 2259)    ED Course  I have reviewed the triage vital signs and the nursing notes.  Pertinent labs & imaging results that were available during my care of the patient were reviewed by me and considered in my medical decision making (see chart for details).    MDM Rules/Calculators/A&P                           Pt's hr is improved after lopressor.  She is given a 2nd L of fluids.  Pt is  signed out to Dr. Clayborne Dana at shift change.   Final Clinical Impression(s) / ED Diagnoses Final diagnoses:  Sinus tachycardia    Rx / DC Orders ED Discharge Orders          Ordered    Ambulatory referral to Cardiology        04/11/21 2303             Jacalyn Lefevre, MD 04/11/21 2328

## 2021-04-11 NOTE — ED Provider Notes (Signed)
11:03 PM Assumed care from Dr. Particia Nearing, please see their note for full history, physical and decision making until this point. In brief this is a 30 y.o. year old female who presented to the ED tonight with No chief complaint on file.     Hjere with palpitations and sinus tachycardia. Better with metoprolol and fluids. Pending urine for discharge.   Urine ok. Labs reviewed and relatively unremarkable. Discussed using her propranolol PRN and following up with cardiology. Will return here for symptomatic or persistent tachycardia.   Discharge instructions, including strict return precautions for new or worsening symptoms, given. Patient and/or family verbalized understanding and agreement with the plan as described.   Labs, studies and imaging reviewed by myself and considered in medical decision making if ordered. Imaging interpreted by radiology.  Labs Reviewed  BASIC METABOLIC PANEL - Abnormal; Notable for the following components:      Result Value   Chloride 113 (*)    CO2 19 (*)    Glucose, Bld 123 (*)    Calcium 8.6 (*)    All other components within normal limits  RESP PANEL BY RT-PCR (FLU A&B, COVID) ARPGX2  MAGNESIUM  CBC  TSH  URINALYSIS, ROUTINE W REFLEX MICROSCOPIC  RAPID URINE DRUG SCREEN, HOSP PERFORMED  I-STAT BETA HCG BLOOD, ED ([facility], [facility], [facility] ONLY)    DG Chest Port 1 View  Final Result      No follow-ups on file.    Sunshine Mackowski, Barbara Cower, MD 04/12/21 [phone]

## 2021-04-12 LAB — RAPID URINE DRUG SCREEN, HOSP PERFORMED
Amphetamines: NOT DETECTED
Barbiturates: NOT DETECTED
Benzodiazepines: POSITIVE — AB
Cocaine: NOT DETECTED
Opiates: NOT DETECTED
Tetrahydrocannabinol: POSITIVE — AB

## 2021-04-12 LAB — URINALYSIS, ROUTINE W REFLEX MICROSCOPIC
Bilirubin Urine: NEGATIVE
Glucose, UA: NEGATIVE mg/dL
Hgb urine dipstick: NEGATIVE
Ketones, ur: NEGATIVE mg/dL
Leukocytes,Ua: NEGATIVE
Nitrite: NEGATIVE
Protein, ur: NEGATIVE mg/dL
Specific Gravity, Urine: 1.017 (ref 1.005–1.030)
pH: 7 (ref 5.0–8.0)

## 2021-04-12 MED ORDER — DIAZEPAM 2.5 MG RE GEL
2.5000 mg | Freq: Once | RECTAL | Status: AC
  Administered 2021-04-12: 2.5 mg via RECTAL
  Filled 2021-04-12: qty 1

## 2021-04-12 MED ORDER — ACETAMINOPHEN 500 MG PO TABS
1000.0000 mg | ORAL_TABLET | Freq: Once | ORAL | Status: AC
  Administered 2021-04-12: 1000 mg via ORAL
  Filled 2021-04-12: qty 2

## 2021-04-12 NOTE — Discharge Instructions (Addendum)
Use your propranolol 10 milligrams once as needed for persistent HR above 150. Come to ED if not improving or having lightheadness, shortness of breath, passing out, chest pain or other concerning symptoms with it.

## 2021-04-12 NOTE — ED Notes (Signed)
ED Provider at bedside. 

## 2021-04-12 NOTE — ED Notes (Signed)
Patient ambulated to bathroom. Patient c/o dizziness and lightheadedness. RN performed orthostatic vitals. Orthostatic vitals negative.

## 2021-05-09 ENCOUNTER — Emergency Department (HOSPITAL_COMMUNITY): Payer: 59

## 2021-05-09 ENCOUNTER — Emergency Department (HOSPITAL_COMMUNITY)
Admission: EM | Admit: 2021-05-09 | Discharge: 2021-05-10 | Disposition: A | Discharge status: Home / Self Care | Payer: 59 | Attending: Emergency Medicine | Admitting: Emergency Medicine

## 2021-05-09 ENCOUNTER — Other Ambulatory Visit: Payer: Self-pay

## 2021-05-09 ENCOUNTER — Encounter (HOSPITAL_COMMUNITY): Payer: Self-pay | Admitting: Emergency Medicine

## 2021-05-09 DIAGNOSIS — R079 Chest pain, unspecified: Secondary | ICD-10-CM | POA: Insufficient documentation

## 2021-05-09 DIAGNOSIS — R002 Palpitations: Secondary | ICD-10-CM | POA: Diagnosis present

## 2021-05-09 DIAGNOSIS — Z91040 Latex allergy status: Secondary | ICD-10-CM | POA: Insufficient documentation

## 2021-05-09 DIAGNOSIS — R0602 Shortness of breath: Secondary | ICD-10-CM | POA: Insufficient documentation

## 2021-05-09 LAB — CBC
HCT: 41.3 % (ref 36.0–46.0)
Hemoglobin: 13.9 g/dL (ref 12.0–15.0)
MCH: 32.2 pg (ref 26.0–34.0)
MCHC: 33.7 g/dL (ref 30.0–36.0)
MCV: 95.6 fL (ref 80.0–100.0)
Platelets: 357 10*3/uL (ref 150–400)
RBC: 4.32 MIL/uL (ref 3.87–5.11)
RDW: 12.1 % (ref 11.5–15.5)
WBC: 9.3 10*3/uL (ref 4.0–10.5)
nRBC: 0 % (ref 0.0–0.2)

## 2021-05-09 LAB — BASIC METABOLIC PANEL
Anion gap: 8 (ref 5–15)
BUN: 11 mg/dL (ref 6–20)
CO2: 18 mmol/L — ABNORMAL LOW (ref 22–32)
Calcium: 9.3 mg/dL (ref 8.9–10.3)
Chloride: 110 mmol/L (ref 98–111)
Creatinine, Ser: 1.11 mg/dL — ABNORMAL HIGH (ref 0.44–1.00)
GFR, Estimated: >60 mL/min (ref >60)
Glucose, Bld: 114 mg/dL — ABNORMAL HIGH (ref 70–99)
Potassium: 3.6 mmol/L (ref 3.5–5.1)
Sodium: 136 mmol/L (ref 135–145)

## 2021-05-09 LAB — I-STAT BETA HCG BLOOD, ED (MC, WL, AP ONLY): I-stat hCG, quantitative: <5 m[IU]/mL (ref <5)

## 2021-05-09 LAB — TROPONIN I (HIGH SENSITIVITY): Troponin I (High Sensitivity): 3 ng/L (ref <18)

## 2021-05-09 NOTE — ED Provider Notes (Signed)
Emergency Medicine Provider Triage Evaluation Note  Destiny Ellis , a 30 y.o. female  was evaluated in triage.  Pt complains of tachycardia, chest pain, shortness of breath and dizziness.  Patient was seen here 3 weeks ago with similar episode, was diagnosed with sinus tachycardia.  She was given 10 mg of propranolol.  She states that she has had multiple episodes of tachycardia since then but they have been relieved with propranolol.  She states today she took 20 mg of propranolol and still has felt like her heart is racing.  She got an ambulatory referral to cardiology at her last visit but cannot be seen till November.  Denies any syncopal episodes but states that she has felt presyncopal.  No cough, fevers or chills.  Review of Systems  Positive: As above Negative: As above  Physical Exam  BP 113/86   Pulse 90   Temp 99 F (37.2 C) (Oral)   Resp 16   SpO2 99%  Gen:   Awake, no distress   Resp:  Normal effort  MSK:   Moves extremities without difficulty  Other:    Medical Decision Making  Medically screening exam initiated at 9:41 PM.  Appropriate orders placed.  Meriem Lemieux was informed that the remainder of the evaluation will be completed by another provider, this initial triage assessment does not replace that evaluation, and the importance of remaining in the ED until their evaluation is complete.  EKG in triage with normal sinus rhythm with a rate of 94.   Leone Brand 05/09/21 2143    Gerhard Munch, MD 05/09/21 2255

## 2021-05-09 NOTE — ED Triage Notes (Signed)
Pt reports racing heart and chest pain.  Home medications had not effect.  No n/v.  This happened three weeks ago, seen in ED, could not get Cards appointment.

## 2021-05-09 NOTE — ED Provider Notes (Signed)
[facility] EMERGENCY DEPARTMENT Provider Note   CSN: [id number] Arrival date & time: 05/09/21  2110     History Chief Complaint  Patient presents with   Tachycardia    Destiny Ellis is a 30 y.o. female.  Destiny Ellis presents with palpitations.  Approximately 3 weeks ago, she had a heart rate elevated up to 170.  She presented to the emergency department and had a comprehensive work-up including TSH.  She was referred to cardiology, but the appointment is not until November.  She has recently establish care with a PCP but has a visit in one month.  Tonight, she was on the couch at rest, and she began to have palpitations.  She had a heart rate up to 130, and she states that after her heart rate elevates, she will have an adrenaline surge which then leaves her with chest pain.  She is concerned because she lives alone, and she states that she does not want to die alone.  She understands that she has been stressed at work, however, the level of stress is not terribly different than her typical stress.  She is concerned about these episodes of palpitations that have happened intermittently since her last ER visit.  Today was just a more prolonged and notable occurrence.  The history is provided by the patient.  Palpitations Palpitations quality:  Fast Onset quality:  Sudden Duration:  3 hours Timing:  Constant Progression:  Waxing and waning Chronicity:  Recurrent Context comment:  Unknown cause Relieved by:  Nothing Worsened by:  Nothing Ineffective treatments:  Beta blockers Associated symptoms: chest pain and shortness of breath   Associated symptoms: no back pain, no cough and no vomiting       Past Medical History:  Diagnosis Date   Anxiety    Depression    Dizziness 2020   pt states she attributes this to "sinus pressure & pollen being out of control"   Dysmenorrhea    Endometriosis    Headache    Migraines    WITH aura   Ovarian cyst    Seasonal  allergies    Upper respiratory infection 2019   severe, required chest xrays, Abx, steroids, heart echo   Vision abnormalities     Patient Active Problem List   Diagnosis Date Noted   Post concussion syndrome 02/12/2021   H/O multiple concussions 02/12/2021   Neck pain 11/30/2018   Chronic migraine without aura without status migrainosus, not intractable 03/13/2017    Past Surgical History:  Procedure Laterality Date   LAPAROSCOPY ABDOMEN DIAGNOSTIC     laporoscopy     PELVIC LAPAROSCOPY  2016   Dx'd endometriosis   TONSILLECTOMY       OB History     Gravida  0   Para  0   Term  0   Preterm  0   AB  0   Living  0      SAB  0   IAB  0   Ectopic  0   Multiple  0   Live Births  0           Family History  Problem Relation Age of Onset   Hyperthyroidism Mother    Thyroid disease Mother    Hypertension Father    Bipolar disorder Father    Migraines Father    Healthy Brother    Hyperlipidemia Maternal Grandmother    Stroke Maternal Grandmother    Hyperlipidemia Maternal Grandfather  Social History   Tobacco Use   Smoking status: Never   Smokeless tobacco: Never  Vaping Use   Vaping Use: Never used  Substance Use Topics   Alcohol use: Yes    Comment: occasional   Drug use: Never    Home Medications Prior to Admission medications   Medication Sig Start Date End Date Taking? Authorizing Provider  almotriptan (AXERT) 12.5 MG tablet TAKE 1 TABLET BY MOUTH AS NEEDED FOR MIGRAINE. MAY REPEAT IN 2 HOURS IF NEEDED 03/11/21   Naomie Dean B, MD  botulinum toxin Type A (BOTOX) 100 units SOLR injection INJECT 155 UNITS  INTRAMUSCULARLY EVERY 3  MONTHS (GIVEN AT MD OFFICE, DISCARD UNUSED) 11/11/20   Anson Fret, MD  buPROPion (WELLBUTRIN XL) 150 MG 24 hr tablet TAKE 1 TABLET BY MOUTH EVERY DAY IN THE MORNING 07/08/20   Anson Fret, MD  butalbital-acetaminophen-caffeine (FIORICET, ESGIC) [phone] MG tablet Take 1-2 tabs every 6 hours as  needed for headache 03/02/18   Anson Fret, MD  cetirizine (ZYRTEC) 10 MG tablet Take 10 mg by mouth daily.    [provider]  gabapentin (NEURONTIN) 100 MG capsule TAKE 1 CAPSULE BY MOUTH THREE TIMES A DAY. Patient not taking: Reported on 03/25/2021 06/19/20   Anson Fret, MD  indomethacin (INDOCIN) 50 MG capsule Take 1 capsule (50 mg total) by mouth 3 (three) times daily with meals. 09/19/20   Anson Fret, MD  JUNEL 1/20 1-20 MG-MCG tablet TAKE 1 TABLET BY MOUTH EVERY DAY 11/07/19   Anson Fret, MD  montelukast (SINGULAIR) 10 MG tablet  07/28/16   [provider]  ondansetron (ZOFRAN) 8 MG tablet TAKE 1 TABLET BY MOUTH EVERY 8 HOURS AS NEEDED FOR NAUSEA OR VOMITING. 06/19/20   Anson Fret, MD  spironolactone (ALDACTONE) 25 MG tablet Take 25 mg by mouth daily.    [provider]  SUMAtriptan (TOSYMRA) 10 MG/ACT SOLN Place 10 mg into the nose every hour. Maximum 3 doses per day. 09/21/18   Anson Fret, MD  Topiramate ER (TROKENDI XR) 50 MG CP24 Take 100 mg by mouth at bedtime. Patient taking differently: Take 150 mg by mouth at bedtime. 03/02/18   Anson Fret, MD    Allergies    Latex, Other, Sulfa antibiotics, and Sulfa antibiotics  Review of Systems   Review of Systems  Constitutional:  Negative for chills and fever.  HENT:  Negative for ear pain and sore throat.   Eyes:  Negative for pain and visual disturbance.  Respiratory:  Positive for shortness of breath. Negative for cough.   Cardiovascular:  Positive for chest pain and palpitations.  Gastrointestinal:  Negative for abdominal pain and vomiting.  Genitourinary:  Negative for dysuria and hematuria.  Musculoskeletal:  Negative for arthralgias and back pain.  Skin:  Negative for color change and rash.  Neurological:  Negative for seizures and syncope.  All other systems reviewed and are negative.  Physical Exam Updated Vital Signs BP 113/86   Pulse 90   Temp 99 F (37.2 C)  (Oral)   Resp 16   SpO2 99%   Physical Exam Vitals and nursing note reviewed.  Constitutional:      Appearance: She is well-developed.  HENT:     Head: Normocephalic and atraumatic.  Cardiovascular:     Rate and Rhythm: Normal rate and regular rhythm.     Heart sounds: Normal heart sounds.  Pulmonary:     Effort: Pulmonary effort is  normal. No tachypnea.     Breath sounds: Normal breath sounds.  Abdominal:     Palpations: Abdomen is soft.     Tenderness: There is no abdominal tenderness.  Musculoskeletal:     Right lower leg: No edema.     Left lower leg: No edema.  Skin:    General: Skin is warm and dry.  Neurological:     General: No focal deficit present.     Mental Status: She is alert and oriented to person, place, and time.  Psychiatric:        Mood and Affect: Mood normal.        Behavior: Behavior normal.    ED Results / Procedures / Treatments   Labs (all labs ordered are listed, but only abnormal results are displayed) Labs Reviewed  BASIC METABOLIC PANEL - Abnormal; Notable for the following components:      Result Value   CO2 18 (*)    Glucose, Bld 114 (*)    Creatinine, Ser 1.11 (*)    All other components within normal limits  CBC  I-STAT BETA HCG BLOOD, ED ([facility], [facility], [facility] ONLY)  TROPONIN I (HIGH SENSITIVITY)    EKG EKG Interpretation  Date/Time:  Friday May 09 2021 22:59:53 EDT Ventricular Rate:  102 PR Interval:  197 QRS Duration: 80 QT Interval:  335 QTC Calculation: 437 R Axis:   108 Text Interpretation: Incomplete analysis due to missing data in precordial lead(s) Sinus tachycardia Borderline right axis deviation Abnormal T, consider ischemia, lateral leads Missing lead(s): V6 Confirmed by Pieter Partridge ([id number]) on 05/09/2021 11:08:36 PM  Radiology DG Chest 2 View  Result Date: 05/09/2021 CLINICAL DATA:  Chest pain. EXAM: CHEST - 2 VIEW COMPARISON:  April 11, 2021 FINDINGS: The heart size and mediastinal contours are within normal limits.  Both lungs are clear. The visualized skeletal structures are unremarkable. IMPRESSION: No active cardiopulmonary disease. Electronically Signed   By: Aram Candela M.D.   On: 05/09/2021 22:14    Procedures Procedures   Medications Ordered in ED Medications - No data to display  ED Course  I have reviewed the triage vital signs and the nursing notes.  Pertinent labs & imaging results that were available during my care of the patient were reviewed by me and considered in my medical decision making (see chart for details).    MDM Rules/Calculators/A&P                           Destiny Ellis is well-appearing.  She presents with palpitations.  She treated herself with propranolol, and here in the ED, her heart rate has been normal sinus rhythm.  She had an extensive evaluation recently, and blood work here is unremarkable.  I have placed another cardiology referral, and have asked that she have an earlier appointment than November.  Until then, think she is suitable for discharge home.  Final Clinical Impression(s) / ED Diagnoses Final diagnoses:  Palpitations    Rx / DC Orders ED Discharge Orders     None        Koleen Distance, MD 05/09/21 2326

## 2021-05-10 NOTE — ED Notes (Signed)
DC instructions reviewed with pt. PT verbalized understanding. Pt DC °

## 2021-05-21 ENCOUNTER — Other Ambulatory Visit: Payer: Self-pay | Admitting: *Deleted

## 2021-05-21 DIAGNOSIS — R000 Tachycardia, unspecified: Secondary | ICD-10-CM

## 2021-05-27 ENCOUNTER — Encounter: Payer: Self-pay | Admitting: Internal Medicine

## 2021-05-27 ENCOUNTER — Ambulatory Visit (INDEPENDENT_AMBULATORY_CARE_PROVIDER_SITE_OTHER): Payer: 59

## 2021-05-27 DIAGNOSIS — R000 Tachycardia, unspecified: Secondary | ICD-10-CM | POA: Diagnosis not present

## 2021-06-01 ENCOUNTER — Other Ambulatory Visit: Payer: Self-pay | Admitting: Neurology

## 2021-06-04 ENCOUNTER — Telehealth: Payer: Self-pay

## 2021-06-04 NOTE — Telephone Encounter (Signed)
Spoke with [employer] Rep who reports pt triggered  critical event this morning 06/04/2021 at 723am, CT.  Monitor shows V-tach - 22 beats at 155bpm.  Pt reported flutter or skipped beats.  Will await faxed report from company.

## 2021-06-04 NOTE — Telephone Encounter (Signed)
Strip received in office and reviewed with Dr Izora Ribas (DOD).  Shows NSVT. Patient should be scheduled with DOD tomorrow.  I spoke with patient. She reports she felt fluttering earlier today.  Has been having dizziness for several months.  Patient scheduled to see Dr Anne Fu tomorrow at 8 AM.  Patient aware she should be evaluated in ED if she passes out or feels as if she will pass out.

## 2021-06-05 ENCOUNTER — Other Ambulatory Visit (HOSPITAL_COMMUNITY): Payer: Self-pay

## 2021-06-05 ENCOUNTER — Other Ambulatory Visit: Payer: Self-pay

## 2021-06-05 ENCOUNTER — Ambulatory Visit (INDEPENDENT_AMBULATORY_CARE_PROVIDER_SITE_OTHER): Payer: 59 | Admitting: Cardiology

## 2021-06-05 ENCOUNTER — Encounter: Payer: Self-pay | Admitting: Cardiology

## 2021-06-05 VITALS — BP 110/70 | HR 88 | Ht 62.0 in | Wt 165.0 lb

## 2021-06-05 DIAGNOSIS — R9431 Abnormal electrocardiogram [ECG] [EKG]: Secondary | ICD-10-CM

## 2021-06-05 DIAGNOSIS — I471 Supraventricular tachycardia: Secondary | ICD-10-CM

## 2021-06-05 DIAGNOSIS — I4729 Other ventricular tachycardia: Secondary | ICD-10-CM

## 2021-06-05 MED ORDER — METOPROLOL TARTRATE 25 MG PO TABS: 12.5000 mg | ORAL_TABLET | Freq: Two times a day (BID) | ORAL | 3 refills | Status: DC

## 2021-06-05 MED ORDER — IVABRADINE HCL 5 MG PO TABS
5.0000 mg | ORAL_TABLET | ORAL | 0 refills | Status: DC
  Filled 2021-06-05: qty 3, 3d supply, fill #0

## 2021-06-05 NOTE — Patient Instructions (Addendum)
Medication Instructions:  Please discontinue your Propranolol for as needed use. Start Metoprolol Tartrate 12.5 mg twice a day. Continue all other medications as listed.  *If you need a refill on your cardiac medications before your next appointment, please call your pharmacy*  Testing/Procedures: Your physician has requested that you have an echocardiogram. Echocardiography is a painless test that uses sound waves to create images of your heart. It provides your doctor with information about the size and shape of your heart and how well your heart's chambers and valves are working. This procedure takes approximately one hour. There are no restrictions for this procedure.  Please schedule after your monitor has been completed.    Your cardiac CT will be scheduled at:   [facility] [address] [place], [place] [address] [phone]  Please arrive at the [facility] main entrance (entrance A) of [facility] 30 minutes prior to test start time. You can use the FREE valet parking offered at the main entrance (encouraged to control the heart rate for the test) Proceed to the [facility] Radiology Department (first floor) to check-in and test prep.  Please follow these instructions carefully (unless otherwise directed):  On the Night Before the Test: Be sure to Drink plenty of water. Do not consume any caffeinated/decaffeinated beverages or chocolate 12 hours prior to your test. Do not take any antihistamines 12 hours prior to your test.  On the Day of the Test: Drink plenty of water until 1 hour prior to the test. Do not eat any food 4 hours prior to the test. You may take your regular medications prior to the test.  Take Corlanor 15 mg two hours prior to test. HOLD Furosemide/Hydrochlorothiazide morning of the test. FEMALES- please wear underwire-free bra if available, avoid dresses & tight clothing      After the Test: Drink plenty of water. After  receiving IV contrast, you may experience a mild flushed feeling. This is normal. On occasion, you may experience a mild rash up to 24 hours after the test. This is not dangerous. If this occurs, you can take Benadryl 25 mg and increase your fluid intake. If you experience trouble breathing, this can be serious. If it is severe call 911 IMMEDIATELY. If it is mild, please call our office. If you take any of these medications: Glipizide/Metformin, Avandament, Glucavance, please do not take 48 hours after completing test unless otherwise instructed.  Please allow 2-4 weeks for scheduling of routine cardiac CTs. Some insurance companies require a pre-authorization which may delay scheduling of this test.   For non-scheduling related questions, please contact the cardiac imaging nurse navigator should you have any questions/concerns: Rockwell Alexandria, Cardiac Imaging Nurse Navigator Larey Brick, Cardiac Imaging Nurse Navigator [facility] Direct Office Dial: [phone]   For scheduling needs, including cancellations and rescheduling, please call [place], [phone].   Follow-Up: At [facility], you and your health needs are our priority.  As part of our continuing mission to provide you with exceptional heart care, we have created designated Provider Care Teams.  These Care Teams include your primary Cardiologist (physician) and Advanced Practice Providers (APPs -  Physician Assistants and Nurse Practitioners) who all work together to provide you with the care you need, when you need it.  We recommend signing up for the patient portal called "MyChart".  Sign up information is provided on this After Visit Summary.  MyChart is used to connect with patients for Virtual Visits (Telemedicine).  Patients are able to view lab/test results, encounter notes, upcoming appointments, etc.  Non-urgent messages can be sent to your provider as well.   To learn more about what you  can do with MyChart, go to [email or web address].    Your next appointment:   3 month(s)  The format for your next appointment:   In Person  Provider:   Donato Schultz, MD  Thank you for choosing [facility]!!
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
## 2021-06-05 NOTE — Assessment & Plan Note (Signed)
-   I will check an echocardiogram to ensure proper function of her heart. - Also check a coronary CT scan to make sure that there are no coronary anomalies-lab work unremarkable.  Her father also died with myocarditis. - I will give her metoprolol  12.5 mg twice a day -I will take her propranolol off her list which she is not taking.  She did have some nausea type side effects with that as well as dizziness.  Her blood pressure can get into the 100s at times. -Continue to wear her preventives monitor until it is complete in mid November. - For her coronary CT scan, we will give her ivabradine 15 mg once prior to scan given her heart rate. -No high risk symptoms such as unexplained syncope. -Knows to avoid caffeine, decongestants such as Sudafed.  Continue with daily exercise. -We will follow-up in approximately 3 months.

## 2021-06-13 ENCOUNTER — Other Ambulatory Visit: Payer: Self-pay | Admitting: Neurology

## 2021-06-13 ENCOUNTER — Other Ambulatory Visit (HOSPITAL_COMMUNITY): Payer: Self-pay

## 2021-06-25 ENCOUNTER — Other Ambulatory Visit: Payer: Self-pay

## 2021-06-25 ENCOUNTER — Encounter: Payer: 59 | Attending: Psychology | Admitting: Psychology

## 2021-06-25 DIAGNOSIS — Z87820 Personal history of traumatic brain injury: Secondary | ICD-10-CM

## 2021-06-25 DIAGNOSIS — G43709 Chronic migraine without aura, not intractable, without status migrainosus: Secondary | ICD-10-CM | POA: Diagnosis present

## 2021-06-25 DIAGNOSIS — F0781 Postconcussional syndrome: Secondary | ICD-10-CM | POA: Insufficient documentation

## 2021-06-26 ENCOUNTER — Ambulatory Visit (INDEPENDENT_AMBULATORY_CARE_PROVIDER_SITE_OTHER): Payer: 59 | Admitting: Adult Health

## 2021-06-26 ENCOUNTER — Ambulatory Visit: Payer: 59 | Admitting: Internal Medicine

## 2021-06-26 ENCOUNTER — Encounter: Payer: Self-pay | Admitting: Adult Health

## 2021-06-26 VITALS — BP 115/78 | HR 70

## 2021-06-26 DIAGNOSIS — G43709 Chronic migraine without aura, not intractable, without status migrainosus: Secondary | ICD-10-CM | POA: Diagnosis not present

## 2021-06-26 NOTE — Progress Notes (Signed)
       BOTOX PROCEDURE NOTE FOR MIGRAINE HEADACHE    Contraindications and precautions discussed with patient(above). Aseptic procedure was observed and patient tolerated procedure. Procedure performed by Butch Penny, NP  The condition has existed for more than 6 months, and pt does not have a diagnosis of ALS, Myasthenia Gravis or Lambert-Eaton Syndrome.  Risks and benefits of injections discussed and pt agrees to proceed with the procedure.  Written consent obtained  These injections are medically necessary. She receives good benefit from these injections. These injections do not cause sedations or hallucinations which the oral therapies may cause.  Indication/Diagnosis: chronic migraine BOTOX(J0585) injection was performed according to protocol by [employer]. 200 units of BOTOX was dissolved into 4 cc NS.   NDC: [address]-83  Type of toxin: Botox  Botox- 100 units x 2 vials Lot: [id number] Expiration:06/2023 NDC: 3825-0539-76   Bacteriostatic 0.9% Sodium Chloride- 80mL total [id number] Expiration:08/17/2022 NDC: 2409-7353-29   Dx:G43.709  Description of procedure:  The patient was placed in a sitting position. The standard protocol was used for Botox as follows, with 5 units of Botox injected at each site:   -Procerus muscle, midline injection  -Corrugator muscle, bilateral injection  -Frontalis muscle, bilateral injection, with 2 sites each side, medial injection was performed in the upper one third of the frontalis muscle, in the region vertical from the medial inferior edge of the superior orbital rim. The lateral injection was again in the upper one third of the forehead vertically above the lateral limbus of the cornea, 1.5 cm lateral to the medial injection site.  -Temporalis muscle injection, 4 sites, bilaterally. The first injection was 3 cm above the tragus of the ear, second injection site was 1.5 cm to 3 cm up from the first injection site in line with the  tragus of the ear. The third injection site was 1.5-3 cm forward between the first 2 injection sites. The fourth injection site was 1.5 cm posterior to the second injection site.  -Occipitalis muscle injection, 3 sites, bilaterally. The first injection was done one half way between the occipital protuberance and the tip of the mastoid process behind the ear. The second injection site was done lateral and superior to the first, 1 fingerbreadth from the first injection. The third injection site was 1 fingerbreadth superiorly and medially from the first injection site.  -Cervical paraspinal muscle injection, 2 sites, bilateral knee first injection site was 1 cm from the midline of the cervical spine, 3 cm inferior to the lower border of the occipital protuberance. The second injection site was 1.5 cm superiorly and laterally to the first injection site.  -Trapezius muscle injection was performed at 3 sites, bilaterally. The first injection site was in the upper trapezius muscle halfway between the inflection point of the neck, and the acromion. The second injection site was one half way between the acromion and the first injection site. The third injection was done between the first injection site and the inflection point of the neck.   Will return for repeat injection in 3 months.   A 200 units of Botox was used, 155 units were injected, the rest of the Botox was wasted. The patient tolerated the procedure well, there were no complications of the above procedure.  Butch Penny, MSN, NP-C 06/26/2021, 1:43 PM [facility] [address], [address] [place], [place] [address] [phone]

## 2021-06-26 NOTE — Progress Notes (Signed)
Botox- 100 units x 2 vials Lot: [id number] Expiration:06/2023 NDC: 2411-4643-14  Bacteriostatic 0.9% Sodium Chloride- 35mL total CJA:[id number] Expiration:08/17/2022 NDC: 3496-1164-35  Dx:G43.709 S/P

## 2021-06-30 ENCOUNTER — Encounter (HOSPITAL_COMMUNITY): Payer: Self-pay

## 2021-06-30 ENCOUNTER — Telehealth (HOSPITAL_COMMUNITY): Payer: Self-pay | Admitting: *Deleted

## 2021-06-30 ENCOUNTER — Other Ambulatory Visit (HOSPITAL_COMMUNITY): Payer: Self-pay | Admitting: *Deleted

## 2021-06-30 ENCOUNTER — Other Ambulatory Visit (HOSPITAL_COMMUNITY): Payer: Self-pay

## 2021-06-30 MED ORDER — IVABRADINE HCL 5 MG PO TABS
ORAL_TABLET | ORAL | 0 refills | Status: DC
  Filled 2021-06-30: qty 3, 1d supply, fill #0

## 2021-06-30 NOTE — Telephone Encounter (Signed)
Attempted to call patient regarding upcoming cardiac CT appointment. °Left message on voicemail with name and callback number ° °Marca Gadsby RN Navigator Cardiac Imaging °[facility] °[phone] Office °[phone] Cell ° °

## 2021-07-01 ENCOUNTER — Telehealth (HOSPITAL_COMMUNITY): Payer: Self-pay | Admitting: *Deleted

## 2021-07-01 ENCOUNTER — Ambulatory Visit: Payer: 59 | Admitting: Neurology

## 2021-07-01 NOTE — Telephone Encounter (Signed)
Reaching out to patient to offer assistance regarding upcoming cardiac imaging study; pt verbalizes understanding of appt date/time, parking situation and where to check in, pre-test NPO status and medications ordered; name and call back number provided for further questions should they arise  Larey Brick RN Navigator Cardiac Imaging [facility] [phone] office [phone] cell  Patient to take 12.5mg  metoprolol and 15mg  ivabradine two hours prior to cardiac CT scan.  She is aware to arrive by 7:30am for her 7:45am scan.

## 2021-07-02 ENCOUNTER — Encounter (HOSPITAL_COMMUNITY): Payer: Self-pay

## 2021-07-02 ENCOUNTER — Ambulatory Visit (HOSPITAL_COMMUNITY)
Admission: RE | Admit: 2021-07-02 | Discharge: 2021-07-02 | Disposition: A | Discharge status: Home / Self Care | Payer: 59 | Source: Ambulatory Visit | Attending: Cardiology | Admitting: Cardiology

## 2021-07-02 ENCOUNTER — Other Ambulatory Visit: Payer: Self-pay

## 2021-07-02 ENCOUNTER — Ambulatory Visit (HOSPITAL_BASED_OUTPATIENT_CLINIC_OR_DEPARTMENT_OTHER): Payer: 59

## 2021-07-02 DIAGNOSIS — R9431 Abnormal electrocardiogram [ECG] [EKG]: Secondary | ICD-10-CM

## 2021-07-02 DIAGNOSIS — I471 Supraventricular tachycardia: Secondary | ICD-10-CM | POA: Insufficient documentation

## 2021-07-02 LAB — ECHOCARDIOGRAM COMPLETE
Area-P 1/2: 4.08 cm2
S' Lateral: 2.8 cm

## 2021-07-02 MED ORDER — AJOVY 225 MG/1.5ML ~~LOC~~ SOAJ
225.0000 mg | SUBCUTANEOUS | 5 refills | Status: DC
Start: 2021-07-02 — End: 2021-12-31

## 2021-07-02 MED ORDER — NITROGLYCERIN 0.4 MG SL SUBL
SUBLINGUAL_TABLET | SUBLINGUAL | Status: AC
  Filled 2021-07-02: qty 2

## 2021-07-02 MED ORDER — IOHEXOL 350 MG/ML SOLN
95.0000 mL | Freq: Once | INTRAVENOUS | Status: AC | PRN
  Administered 2021-07-02: 95 mL via INTRAVENOUS

## 2021-07-02 MED ORDER — NITROGLYCERIN 0.4 MG SL SUBL
0.8000 mg | SUBLINGUAL_TABLET | Freq: Once | SUBLINGUAL | Status: AC
  Administered 2021-07-02: 0.8 mg via SUBLINGUAL

## 2021-07-02 NOTE — Telephone Encounter (Signed)
I have ordered Ajovy monthly injections for the patient.  I did talk to [facility] our Audiological scientist.  She did advise that with her insurance they have to go through [place] and [employer] requires recertification of Botox every 3 months.  They do inquire on the authorization if the patient is on a CGRP.  So there is a good possibility that either Ajovy or Botox could be denied in the future.  Just an FYI.  The order has been sent in and we can certainly try to get it approved.  As for her Botox injections, please advise the patient that it can take 10 days for Botox to take effect.    To note, on the day of her Botox the patient stated that she was "getting a headache."

## 2021-07-03 ENCOUNTER — Encounter: Payer: Self-pay | Admitting: Cardiology

## 2021-07-07 ENCOUNTER — Telehealth: Payer: Self-pay

## 2021-07-07 NOTE — Telephone Encounter (Signed)
I submitted a PA request for Ajovy on CMM, Key: [id number] - PA Case ID: [id number].  Approved. OTRRNH:[id number];FXOVAN:[id number];Review Type:Prior Auth;Coverage Start Date:06/07/2021;Coverage End Date:07/07/2022

## 2021-07-07 NOTE — Telephone Encounter (Signed)
Have Contacted Pt insurance company no PA needed for Ajovy . In Network Ref.# [id number] . Called Pharmacy pt can pick Ajovy up tomorrow .  Will contact Pt through mychart to make her aware

## 2021-07-08 NOTE — Telephone Encounter (Signed)
Spoke with pt and got her scheduled to see Dr. Anne Fu on 11/29 at [facility].  Pt aware of location.  She asked about continuing the Metoprolol.  States she's still feeling her heart race at times, hands are swollen, she's constantly peeing and not sleeping well.  Feels this is related to the Metoprolol.  States she cut it by half about a week ago.  Inquired if symptoms improved after cutting Metoprolol in half and she said they did not and had actually worsened.  Advised pt to continue Metoprolol and keep plan to see Dr. Anne Fu next week for evaluation.  Pt agreeable to plan.

## 2021-07-15 ENCOUNTER — Ambulatory Visit (INDEPENDENT_AMBULATORY_CARE_PROVIDER_SITE_OTHER): Payer: 59 | Admitting: Cardiology

## 2021-07-15 ENCOUNTER — Ambulatory Visit (HOSPITAL_BASED_OUTPATIENT_CLINIC_OR_DEPARTMENT_OTHER): Payer: 59 | Admitting: Cardiology

## 2021-07-15 ENCOUNTER — Other Ambulatory Visit: Payer: Self-pay

## 2021-07-15 DIAGNOSIS — R002 Palpitations: Secondary | ICD-10-CM | POA: Diagnosis not present

## 2021-07-15 DIAGNOSIS — I4729 Other ventricular tachycardia: Secondary | ICD-10-CM | POA: Diagnosis not present

## 2021-07-15 MED ORDER — METOPROLOL TARTRATE 25 MG PO TABS: 12.5000 mg | ORAL_TABLET | ORAL | 3 refills | Status: DC | PRN
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
## 2021-07-15 NOTE — Patient Instructions (Signed)
Medication Instructions:  You may take your Metoprolol on an as needed basis. Continue all other medications as listed.  *If you need a refill on your cardiac medications before your next appointment, please call your pharmacy*  Follow-Up: At [facility], you and your health needs are our priority.  As part of our continuing mission to provide you with exceptional heart care, we have created designated Provider Care Teams.  These Care Teams include your primary Cardiologist (physician) and Advanced Practice Providers (APPs -  Physician Assistants and Nurse Practitioners) who all work together to provide you with the care you need, when you need it.  We recommend signing up for the patient portal called "MyChart".  Sign up information is provided on this After Visit Summary.  MyChart is used to connect with patients for Virtual Visits (Telemedicine).  Patients are able to view lab/test results, encounter notes, upcoming appointments, etc.  Non-urgent messages can be sent to your provider as well.   To learn more about what you can do with MyChart, go to [email or web address].    Your next appointment:   3 month(s)  The format for your next appointment:   In Person  Provider:   Donato Schultz, MD    Thank you for choosing [facility]!!

## 2021-07-15 NOTE — Assessment & Plan Note (Addendum)
Extensive cardiac work-up including coronary CT scan echocardiogram all reassuring.  Monitoring took place and there were several instances of heart fluttering/pounding that were associated with sinus rhythm/mild sinus tachycardia.  She did have 1 episode of nonsustained ventricular tachycardia in the setting of normal ejection fraction and no coronary artery disease that was felt to be fluttering.  Because of these episodes, she was given metoprolol tartrate 12.5 mg twice a day.  It would not be unreasonable to trial verapamil, calcium channel blocker, which cannot only help to decrease burden of rapid heart rate but also can be a prophylactic against migraine.  Verapamil extended release 120 mg once a day.  After discussion with her however, she does not like the way the metoprolol is making her feel even though it is at low dosing.  Sometimes she is waking up in the middle night having to go to the bathroom more frequently.  I think that we should try a more conservative strategy and use the metoprolol on an as-needed basis if she is feeling her heart racing.  Also counseled her that she does not need to go to the emergency room like she did previously if 1 of these situations occur.  She has had no high risk symptoms such as syncope.  She is concerned about utilizing verapamil also because of blood pressure lowering.  Currently today she is 100/68.

## 2021-07-15 NOTE — Assessment & Plan Note (Addendum)
Overall reassuring ejection fraction, no evidence of coronary artery disease on CT scan.  Heart rate on average was 155 bpm during these episode of nonsustained ventricular tachycardia.  I think would be reasonable to utilize verapamil extended release 120 mg a day to help suppress.  She is concerned about this affecting her blood pressure once again being 100/68.  We have mutually decided to trial more conservative management strategies, orange juice for potassium (bananas causes itching).  Daily exercise.  She has an apple watch as well.  She did have a fluttering sensation during this episode documented on monitor.  However once again, she did have multiple episodes of heart pounding etc. which were associated with sinus rhythm.  Overall reassurance.  Continue to eat banana for potassium as well.

## 2021-07-22 ENCOUNTER — Other Ambulatory Visit: Payer: Self-pay

## 2021-07-22 ENCOUNTER — Encounter: Payer: 59 | Attending: Psychology

## 2021-07-22 DIAGNOSIS — F0781 Postconcussional syndrome: Secondary | ICD-10-CM | POA: Insufficient documentation

## 2021-07-22 DIAGNOSIS — F4312 Post-traumatic stress disorder, chronic: Secondary | ICD-10-CM | POA: Insufficient documentation

## 2021-07-22 DIAGNOSIS — Z87820 Personal history of traumatic brain injury: Secondary | ICD-10-CM | POA: Diagnosis present

## 2021-07-22 DIAGNOSIS — G43709 Chronic migraine without aura, not intractable, without status migrainosus: Secondary | ICD-10-CM | POA: Insufficient documentation

## 2021-07-22 DIAGNOSIS — F411 Generalized anxiety disorder: Secondary | ICD-10-CM | POA: Insufficient documentation

## 2021-07-22 DIAGNOSIS — R413 Other amnesia: Secondary | ICD-10-CM | POA: Diagnosis not present

## 2021-07-22 NOTE — Progress Notes (Signed)
   Behavioral Observation The patient appeared well-groomed and appropriately dressed for the testing session. Her manners were polite and appropriate to the situation. Her attitude toward testing was positive and she showed good effort.   Neuropsychology Note  Destiny Ellis completed 60 minutes of neuropsychological testing with technician, Destiny Ellis, BA, under the supervision of Destiny Phenix, PsyD., Clinical Neuropsychologist. The patient did not appear overtly distressed by the testing session, per behavioral observation or via self-report to the technician. Rest breaks were offered.   Clinical Decision Making: In considering the patient's current level of functioning, level of presumed impairment, nature of symptoms, emotional and behavioral responses during clinical interview, level of literacy, and observed level of motivation/effort, a battery of tests was selected by Dr. Kieth Ellis during initial consultation on 06/25/2021. This was communicated to the technician. Communication between the neuropsychologist and technician was ongoing throughout the testing session and changes were made as deemed necessary based on patient performance on testing, technician observations and additional pertinent factors such as those listed above.  Tests Administered: Comprehensive Attention Battery (CAB) Continuous Performance Test (CPT)  Results: Will be included in final report   Feedback to Patient: Destiny Ellis will return on 07/24/2021 for further testing and again on 08/20/2021 for an interactive feedback session with Dr. Kieth Ellis at which time her test performances, clinical impressions and treatment recommendations will be reviewed in detail. The patient understands she can contact our office should she require our assistance before this time.  60 minutes spent face-to-face with patient administering standardized tests, 30 minutes spent scoring Radiographer, therapeutic). [CPT [id number], [id number]]  Full report  to follow.

## 2021-07-23 ENCOUNTER — Ambulatory Visit: Payer: 59 | Admitting: Psychology

## 2021-07-24 ENCOUNTER — Other Ambulatory Visit: Payer: Self-pay

## 2021-07-24 DIAGNOSIS — F0781 Postconcussional syndrome: Secondary | ICD-10-CM | POA: Diagnosis not present

## 2021-07-24 DIAGNOSIS — R413 Other amnesia: Secondary | ICD-10-CM | POA: Diagnosis not present

## 2021-07-24 NOTE — Progress Notes (Signed)
Behavioral Observations The patient appeared well-groomed and appropriately dressed. Her manners were polite and appropriate to the situation. She appeared stressed at the onset of testing. Her attitude toward testing was neutral and she maintained good effort throughout the test.   The patient reported that she remembered an additional concussion that occurred while skiing in early March of 2016 when she collided with a snowboarder. She does think the collision caused her to black out and she continued to ski for the remainder of the week.  The patient requested that Dr. Sima Matas be informed that her heart tests showed her heart is structurally sound and that she has ceased taking her blood pressure medication.  Neuropsychology Note  Miagrace Oja completed 180 minutes of neuropsychological testing with technician, Dina Rich, BA, under the supervision of Ilean Skill, PsyD., Clinical Neuropsychologist. The patient did not appear overtly distressed by the testing session, per behavioral observation or via self-report to the technician. Rest breaks were offered.   Clinical Decision Making: In considering the patient's current level of functioning, level of presumed impairment, nature of symptoms, emotional and behavioral responses during clinical interview, level of literacy, and observed level of motivation/effort, a battery of tests was selected by Dr. Sima Matas during initial consultation on 06/25/2021. This was communicated to the technician. Communication between the neuropsychologist and technician was ongoing throughout the testing session and changes were made as deemed necessary based on patient performance on testing, technician observations and additional pertinent factors such as those listed above.  Tests Administered: Wechsler Adult Intelligence Scale, 4th Edition (WAIS-IV) Wechsler Memory Scale, 4th Edition (WMS-IV); Adult Battery   Results:  WAIS  Composite Score  Summary  Scale Sum of Scaled Scores Composite Score Percentile Rank 95% Conf. Interval Qualitative Description  Verbal Comprehension 34 VCI 107 68 101-112 Average  Perceptual Reasoning 23 PRI 86 18 80-93 Low Average  Working Memory 14 WMI 83 13 77-91 Low Average  Processing Speed 17 PSI 92 30 84-101 Average  Full Scale 88 FSIQ 92 30 88-96 Average  General Ability 57 GAI 97 42 92-102 Average      Verbal Comprehension Subtests Summary  Subtest Raw Score Scaled Score Percentile Rank Reference Group Scaled Score SEM  Similarities 23 9 37 9 1.08  Vocabulary 49 14 91 14 0.79  Information 14 11 63 11 0.90  (Comprehension) 30 13 84 13 1.16       Perceptual Reasoning Subtests Summary  Subtest Raw Score Scaled Score Percentile Rank Reference Group Scaled Score SEM  Block Design 45 10 50 10 0.90  Matrix Reasoning 9 5 5 4  0.90  Visual Puzzles 12 8 25 8  0.95  (Figure Weights) 14 9 37 9 0.85  (Picture Completion) 9 7 16 7  1.12       Working Doctor, general practice Raw Score Scaled Score Percentile Rank Reference Group Scaled Score SEM  Digit Span 20 6 9 6  0.73  Arithmetic 12 8 25 9  0.95  (Letter-Number Seq.) 12 5 5 5  0.90       Processing Speed Subtests Summary  Subtest Raw Score Scaled Score Percentile Rank Reference Group Scaled Score SEM  Symbol Search 29 8 25 8  1.56  Coding 67 9 37 9 1.20  (Cancellation) 43 11 63 11 1.62      WMS  Index Score Summary  Index Sum of Scaled Scores Index Score Percentile Rank 95% Confidence Interval Qualitative Descriptor  Auditory Memory (AMI) 32 88 21 82-95 Low Average  Visual Memory (VMI)  40 100 50 94-106 Average  Visual Working Memory (VWMI) 14 83 13 77-91 Low Average  Immediate Memory (IMI) 36 93 32 87-100 Average  Delayed Memory (DMI) 36 93 32 87-100 Average      Primary Subtest Scaled Score Summary  Subtest Domain Raw Score Scaled Score Percentile Rank  Logical Memory I AM 28 11 63  Logical Memory II AM 23  10 50  Verbal Pa[employer] Associates I AM 19 6 9   Verbal [employer] II AM 6 5 5   Designs I VM 63 8 25  Designs II VM 61 10 50  Visual Reproduction I VM 39 11 63  Visual Reproduction II VM 34 11 63  Spatial Addition VWM 9 6 9   Symbol Span VWM 20 8 25       Auditory Memory Process Score Summary  Process Score Raw Score Scaled Score Percentile Rank Cumulative Percentage (Base Rate)  LM II Recognition 26 - - 51-75%  VPA II Recognition 32 - - 3-9%       Visual Memory Process Score Summary  Process Score Raw Score Scaled Score Percentile Rank Cumulative Percentage (Base Rate)  DE I Content 38 10 50 -  DE I Spatial 13 6 9  -  DE II Content 39 11 63 -  DE II Spatial 14 11 63 -  DE II Recognition 14 - - 17-25%  VR II Recognition 6 - - 51-75%      ABILITY-MEMORY ANALYSIS  Ability Score:  VCI: 107 Date of Testing:  WAIS-IV; WMS-IV 2021/07/24  Predicted Difference Method   Index Predicted WMS-IV Index Score Actual WMS-IV Index Score Difference Critical Value  Significant Difference Y/N Base Rate  Auditory Memory 104 88 16 10.15 Y 10-15%  Visual Memory 103 100 3 8.59 N   Visual Working Memory 104 83 21 10.56 Y 5%  Immediate Memory 104 93 11 9.78 Y 20%  Delayed Memory 104 93 11 11.66 N   Statistical significance (critical value) at the .01 level.         Feedback to Patient: Destiny Ellis will return on 08/20/2021 for an interactive feedback session with Dr. at which time her test performances, clinical impressions and treatment recommendations will be reviewed in detail. The patient understands she can contact our office should she require our assistance before this time.  180 minutes spent face-to-face with patient administering standardized tests, 30 minutes spent scoring ). [CPT 2021/07/26, 96139]  Full report to follow.

## 2021-07-27 ENCOUNTER — Other Ambulatory Visit: Payer: Self-pay | Admitting: Neurology

## 2021-07-30 ENCOUNTER — Other Ambulatory Visit: Payer: Self-pay

## 2021-07-30 ENCOUNTER — Encounter (HOSPITAL_BASED_OUTPATIENT_CLINIC_OR_DEPARTMENT_OTHER): Payer: 59 | Admitting: Psychology

## 2021-07-30 DIAGNOSIS — G43709 Chronic migraine without aura, not intractable, without status migrainosus: Secondary | ICD-10-CM

## 2021-07-30 DIAGNOSIS — R413 Other amnesia: Secondary | ICD-10-CM | POA: Diagnosis not present

## 2021-07-30 DIAGNOSIS — Z87820 Personal history of traumatic brain injury: Secondary | ICD-10-CM | POA: Diagnosis not present

## 2021-07-30 DIAGNOSIS — F4312 Post-traumatic stress disorder, chronic: Secondary | ICD-10-CM

## 2021-07-30 DIAGNOSIS — F0781 Postconcussional syndrome: Secondary | ICD-10-CM | POA: Diagnosis not present

## 2021-07-30 DIAGNOSIS — F411 Generalized anxiety disorder: Secondary | ICD-10-CM

## 2021-08-18 ENCOUNTER — Encounter: Payer: Self-pay | Admitting: Psychology
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
## 2021-08-18 NOTE — Progress Notes (Signed)
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  Immediate Memory 104 93 11 9.78 Y 20%  Delayed Memory 104 93 11 11.66 N    Statistical significance (critical value) at the .01 level.       Summary of Results:   Overall, the results of the current neuropsychological evaluation are consistent with a pattern of significant variability in attention and concentration with times of significant weaknesses poor visual encoding capacity and variability in auditory encoding capacity.  Overall, the patient appeared to be able to remain free from external distractors for the most part and her focus execute abilities were generally within normal limits although she did have some difficulties with visual scanning and visual searching capacity whereas nonvisual scanning focus execute abilities were within normal limits.  The patient had greater difficulty processing auditory information versus visual information on attentional measures which also showed up with more significant memory and learning deficits for auditory memory versus visual memory.  Her visual memory was generally within normal  limits and this was not particularly good considering her weaknesses for visual encoding capacity.  While the patient had difficulty with attentional measures impacting learning and memory the information that she was able to initially encode and store/organize did remain available after period of delay.  Individual memory task that she had difficulty with encoding and initially processing did not improve under recognition/cued recall suggesting a primary difficulty with encoding that information.  The patient showed some weaknesses with regard to visual spatial capacity, visual reasoning and verbal reasoning capacity and visual estimations as well as deficits with regard to identifying visual anomalies within a visual gestalt.  Her visual analysis and organization were within normal limits.  The patient does relatively well on measures of focus execute abilities and information processing speed when there is no visual scanning and visual searching components.  When she is required to visually scan her performance on focus execute abilities significantly deteriorates.  The patient shows excellent retention of her vocabulary skills, her general fund of information and her social judgment and comprehension capacity.  Impression/Diagnosis:   Overall, the results of the current neuropsychological evaluation are consistent with patterns typically seen with postconcussion syndrome.  She is now roughly 1 year post her most recent concussive event.  While the patient reports that she feels like she returned to baseline in general after her previous concussive events this has not been true after her January 2022 concussion.  The patient is showing difficulties with visual scanning and visual searching capacity, variability in weaknesses for auditory encoding and visual encoding capacities and difficulty with learning and memory particularly with regard to auditory information while visual memory and learning appears better  preserved although still below estimations of premorbid functioning.  Other attentional variables appear to be relatively well preserved including sustaining attention and shifting attention as well as her capacity to remain free from external distractors.  Her pattern of strengths and weaknesses in various attentional domains are not consistent with those typically seen with attention deficit disorder.  The patient is able  to retain information after period of delay if the information is initially stored and organized.  She primarily has difficulty with fluctuations in encoding capacity and its impact on initially learning new information.  The patient is showing executive functioning weaknesses with regard to reasoning and problem-solving both auditorily and visually, difficulties with visual scanning and visual searching capacity, difficulties with auditory and visual encoding capacity.  While the patient does show consistent neuropsychological deficits that are typical of postconcussion syndrome there are a number of other variables that are likely having a significant deleterious effect on her day-to-day functioning.  The patient has a longstanding history of PTSD with multiple traumatic experiences, persistent and ongoing significant sleep disturbance, increasing anxiety and worry that she will make errors that will have significant negative impact on both her work functioning as well as her general life functioning and has increasingly become hyper focused on her attentional capacity and memory capacity.  I do think that if we can have a positive change in her sleep patterns and more effectively address her anxiety and PTSD symptoms that we may see some improvements.    The spectrum and pattern of difficulties would suggest that she would be a good candidate for consideration of ketamine therapies for both her posttraumatic headaches, postconcussion syndrome and PTSD symptoms.  She may also be a good  candidate for ganglion block therapies as well.  However, it is very difficult to get health insurance is to pay for ketamine therapies.  She does report that her Botox therapies were helpful for her migraine headaches.    The patient should also continue with her psychotherapeutic efforts around her chronic PTSD symptoms.  Improving sleep patterns, good dietary patterns and regular sustained physical activity will likely be helpful to the patient.  I will sit down with the patient go over specific recommendations around the treatment of her myriad of symptoms including postconcussion syndrome, PTSD and sustained sleep disturbance.  The patient is also experiencing ongoing and worsening anxiety with a hyperfocus on difficulties which are likely counterproductive.  The patient is currently taking Wellbutrin which while it may help with some of her attentional issues may not address the anxiety and/or PTSD symptoms that are also present.  While I suspect she has been tried on SSRI medications in the past I do think that it likely would be a worthwhile effort to look at SSRI medications as well.  Diagnosis:    Post concussion syndrome  Chronic migraine without aura without status migrainosus, not intractable  H/O multiple concussions  Chronic posttraumatic stress disorder  Generalized anxiety disorder   _____________________ Ilean Skill, Psy.D. Clinical Neuropsychologist

## 2021-08-20 ENCOUNTER — Other Ambulatory Visit: Payer: Self-pay

## 2021-08-20 ENCOUNTER — Encounter: Payer: 59 | Attending: Psychology | Admitting: Psychology

## 2021-08-20 DIAGNOSIS — F4312 Post-traumatic stress disorder, chronic: Secondary | ICD-10-CM

## 2021-08-20 DIAGNOSIS — G43709 Chronic migraine without aura, not intractable, without status migrainosus: Secondary | ICD-10-CM | POA: Diagnosis not present

## 2021-08-20 DIAGNOSIS — R413 Other amnesia: Secondary | ICD-10-CM | POA: Diagnosis not present

## 2021-08-20 DIAGNOSIS — F0781 Postconcussional syndrome: Secondary | ICD-10-CM | POA: Diagnosis not present

## 2021-08-20 DIAGNOSIS — Z87820 Personal history of traumatic brain injury: Secondary | ICD-10-CM | POA: Diagnosis not present

## 2021-08-20 DIAGNOSIS — F411 Generalized anxiety disorder: Secondary | ICD-10-CM | POA: Insufficient documentation

## 2021-08-23 ENCOUNTER — Other Ambulatory Visit: Payer: Self-pay | Admitting: Neurology

## 2021-08-25 ENCOUNTER — Encounter: Payer: Self-pay | Admitting: Neurology

## 2021-08-26 NOTE — Telephone Encounter (Signed)
Noted  

## 2021-09-03 ENCOUNTER — Encounter: Payer: Self-pay | Admitting: Cardiology

## 2021-09-03 ENCOUNTER — Ambulatory Visit (INDEPENDENT_AMBULATORY_CARE_PROVIDER_SITE_OTHER): Payer: 59 | Admitting: Cardiology

## 2021-09-03 ENCOUNTER — Other Ambulatory Visit: Payer: Self-pay

## 2021-09-03 DIAGNOSIS — I4729 Other ventricular tachycardia: Secondary | ICD-10-CM | POA: Diagnosis not present

## 2021-09-03 DIAGNOSIS — R002 Palpitations: Secondary | ICD-10-CM

## 2021-09-03 NOTE — Patient Instructions (Signed)
Medication Instructions:  Your physician recommends that you continue on your current medications as directed. Please refer to the Current Medication list given to you today.  *If you need a refill on your cardiac medications before your next appointment, please call your pharmacy*   Lab Work: NONE If you have labs (blood work) drawn today and your tests are completely normal, you will receive your results only by: MyChart Message (if you have MyChart) OR A paper copy in the mail If you have any lab test that is abnormal or we need to change your treatment, we will call you to review the results.   Testing/Procedures: NONE   Follow-Up: At [facility], you and your health needs are our priority.  As part of our continuing mission to provide you with exceptional heart care, we have created designated Provider Care Teams.  These Care Teams include your primary Cardiologist (physician) and Advanced Practice Providers (APPs -  Physician Assistants and Nurse Practitioners) who all work together to provide you with the care you need, when you need it.   Your next appointment:   6 month(s)  The format for your next appointment:   In Person  Provider:   Donato Schultz, MD
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
## 2021-09-03 NOTE — Assessment & Plan Note (Signed)
Overall doing fairly well.  Occasionally will feel a skipped during exercise.  Has apple watch in place.  No dangerous or high risk symptoms such as syncope.  Prior monitor reviewed as above.  She has metoprolol which she is taken rarely 12.5 mg as needed.  She is eating more bananas, potassium.  She will occasionally have [place] press coffee which she enjoys.  This seems reasonable.  She will let us know if anything changes.

## 2021-09-03 NOTE — Assessment & Plan Note (Signed)
Reassuring echocardiogram normal pump function.  No evidence of coronary disease on CT.  No higher symptoms such as syncope.  Continue with current treatment strategy which includes metoprolol as needed right now.  She is eating bananas for potassium.  Exercise

## 2021-09-08 ENCOUNTER — Other Ambulatory Visit: Payer: Self-pay | Admitting: Neurology

## 2021-09-15 ENCOUNTER — Telehealth: Payer: Self-pay | Admitting: Neurology

## 2021-09-15 NOTE — Telephone Encounter (Signed)
Pt called in stating that her insurance company told her the copay for her SUMAtriptan (TOSYMRA) 10 MG/ACT SOLN would be $400. She would like to know if we have an alternative or samples/copay card that could help her with the cost. Please advise at [phone].

## 2021-09-16 NOTE — Telephone Encounter (Signed)
Spoke with the patient. She clarified that it was the Almotriptan she was asking about. She still has Tosymra leftover and does not need that. She paid $400 for the last refill of Almotriptan. I directed her to goodrx for a heavily discounted rate. She also expressed concern that she never got to follow-up with Dr Lucia Gaskins after her formal memory testing in December. She understands the botox visits are for the injections and leave very little to no time for other discussions so she has scheduled a follow-up appointment with Dr Lucia Gaskins on 2/27 @ 3 pm. Pt's questions were answered and she verbalized appreciation for the call. Pt only wants to see Dr Lucia Gaskins.

## 2021-09-16 NOTE — Addendum Note (Signed)
Addended by: Guy Begin on: 09/16/2021 02:34 PM   Modules accepted: Orders

## 2021-09-22 ENCOUNTER — Ambulatory Visit: Payer: 59 | Admitting: Adult Health

## 2021-09-24 ENCOUNTER — Encounter: Payer: Self-pay | Admitting: Psychology

## 2021-09-24 NOTE — Progress Notes (Signed)
08/20/2021 4 PM-5 PM  Today's visit was an in person visit was conducted in my outpatient clinic office with the patient myself present.  Today we went over the results of the recent neuropsychological evaluation with included recommendations.  The patient's complete neuropsychological evaluation report can be found in her EMR dated 07/30/2021.  I have included the summary/impressions and recommendations below for convenience.  We reviewed the results of her multiple concussive events and residual cognitive and PTSD symptoms.  The patient continues to be followed by [facility] for her posttraumatic headache/migraine.  I also think that she is likely a good candidate for consideration of ketamine infusion therapies although these are very unlikely to be covered by traditional health insurance at this point.  We discussed potential increase availability and other options around these interventions today as well.    Summary of Results:                        Overall, the results of the current neuropsychological evaluation are consistent with a pattern of significant variability in attention and concentration with times of significant weaknesses poor visual encoding capacity and variability in auditory encoding capacity.  Overall, the patient appeared to be able to remain free from external distractors for the most part and her focus execute abilities were generally within normal limits although she did have some difficulties with visual scanning and visual searching capacity whereas nonvisual scanning focus execute abilities were within normal limits.  The patient had greater difficulty processing auditory information versus visual information on attentional measures which also showed up with more significant memory and learning deficits for auditory memory versus visual memory.  Her visual memory was generally within normal limits and this was not particularly good considering her weaknesses for visual  encoding capacity.  While the patient had difficulty with attentional measures impacting learning and memory the information that she was able to initially encode and store/organize did remain available after period of delay.  Individual memory task that she had difficulty with encoding and initially processing did not improve under recognition/cued recall suggesting a primary difficulty with encoding that information.  The patient showed some weaknesses with regard to visual spatial capacity, visual reasoning and verbal reasoning capacity and visual estimations as well as deficits with regard to identifying visual anomalies within a visual gestalt.  Her visual analysis and organization were within normal limits.  The patient does relatively well on measures of focus execute abilities and information processing speed when there is no visual scanning and visual searching components.  When she is required to visually scan her performance on focus execute abilities significantly deteriorates.  The patient shows excellent retention of her vocabulary skills, her general fund of information and her social judgment and comprehension capacity.   Impression/Diagnosis:                     Overall, the results of the current neuropsychological evaluation are consistent with patterns typically seen with postconcussion syndrome.  She is now roughly 1 year post her most recent concussive event.  While the patient reports that she feels like she returned to baseline in general after her previous concussive events this has not been true after her January 2022 concussion.  The patient is showing difficulties with visual scanning and visual searching capacity, variability in weaknesses for auditory encoding and visual encoding capacities and difficulty with learning and memory particularly with regard to auditory information while visual memory  and learning appears better preserved although still below estimations of premorbid  functioning.  Other attentional variables appear to be relatively well preserved including sustaining attention and shifting attention as well as her capacity to remain free from external distractors.  Her pattern of strengths and weaknesses in various attentional domains are not consistent with those typically seen with attention deficit disorder.  The patient is able to retain information after period of delay if the information is initially stored and organized.  She primarily has difficulty with fluctuations in encoding capacity and its impact on initially learning new information.  The patient is showing executive functioning weaknesses with regard to reasoning and problem-solving both auditorily and visually, difficulties with visual scanning and visual searching capacity, difficulties with auditory and visual encoding capacity.  While the patient does show consistent neuropsychological deficits that are typical of postconcussion syndrome there are a number of other variables that are likely having a significant deleterious effect on her day-to-day functioning.  The patient has a longstanding history of PTSD with multiple traumatic experiences, persistent and ongoing significant sleep disturbance, increasing anxiety and worry that she will make errors that will have significant negative impact on both her work functioning as well as her general life functioning and has increasingly become hyper focused on her attentional capacity and memory capacity.  I do think that if we can have a positive change in her sleep patterns and more effectively address her anxiety and PTSD symptoms that we may see some improvements.     The spectrum and pattern of difficulties would suggest that she would be a good candidate for consideration of ketamine therapies for both her posttraumatic headaches, postconcussion syndrome and PTSD symptoms.  She may also be a good candidate for ganglion block therapies as well.  However,  it is very difficult to get health insurance is to pay for ketamine therapies.  She does report that her Botox therapies were helpful for her migraine headaches.     The patient should also continue with her psychotherapeutic efforts around her chronic PTSD symptoms.  Improving sleep patterns, good dietary patterns and regular sustained physical activity will likely be helpful to the patient.  I will sit down with the patient go over specific recommendations around the treatment of her myriad of symptoms including postconcussion syndrome, PTSD and sustained sleep disturbance.  The patient is also experiencing ongoing and worsening anxiety with a hyperfocus on difficulties which are likely counterproductive.  The patient is currently taking Wellbutrin which while it may help with some of her attentional issues may not address the anxiety and/or PTSD symptoms that are also present.  While I suspect she has been tried on SSRI medications in the past I do think that it likely would be a worthwhile effort to look at SSRI medications as well.   Diagnosis:                                Post concussion syndrome   Chronic migraine without aura without status migrainosus, not intractable   H/O multiple concussions   Chronic posttraumatic stress disorder   Generalized anxiety disorder     _____________________ Ilean Skill, Psy.D. Clinical Neuropsychologist

## 2021-09-30 ENCOUNTER — Ambulatory Visit (INDEPENDENT_AMBULATORY_CARE_PROVIDER_SITE_OTHER): Payer: 59 | Admitting: Neurology

## 2021-09-30 DIAGNOSIS — G43709 Chronic migraine without aura, not intractable, without status migrainosus: Secondary | ICD-10-CM

## 2021-09-30 MED ORDER — KETOROLAC TROMETHAMINE 60 MG/2ML IM SOLN
60.0000 mg | Freq: Once | INTRAMUSCULAR | Status: AC
  Administered 2021-09-30: 60 mg via INTRAMUSCULAR

## 2021-09-30 NOTE — Addendum Note (Signed)
Addended by: Brandon Melnick on: 09/30/2021 01:29 PM   Modules accepted: Orders

## 2021-09-30 NOTE — Progress Notes (Signed)
Verbal order for toradol 60mg  IM for pt by Dr. . Under aseptic technique 60mg  IM given to R upper outer quadrant. Pt tolerated well, bandaid applied.

## 2021-09-30 NOTE — Progress Notes (Signed)
Consent Form Botulism Toxin Injection For Chronic Migraine  09/30/2021: Stable, still > 70% improvement in migraine frequency with botox.   Reviewed orally with patient, additionally signature is on file:  Botulism toxin has been approved by the Federal drug administration for treatment of chronic migraine. Botulism toxin does not cure chronic migraine and it may not be effective in some patients.  The administration of botulism toxin is accomplished by injecting a small amount of toxin into the muscles of the neck and head. Dosage must be titrated for each individual. Any benefits resulting from botulism toxin tend to wear off after 3 months with a repeat injection required if benefit is to be maintained. Injections are usually done every 3-4 months with maximum effect peak achieved by about 2 or 3 weeks. Botulism toxin is expensive and you should be sure of what costs you will incur resulting from the injection.  The side effects of botulism toxin use for chronic migraine may include:   -Transient, and usually mild, facial weakness with facial injections  -Transient, and usually mild, head or neck weakness with head/neck injections  -Reduction or loss of forehead facial animation due to forehead muscle weakness  -Eyelid drooping  -Dry eye  -Pain at the site of injection or bruising at the site of injection  -Double vision  -Potential unknown long term risks  Contraindications: You should not have Botox if you are pregnant, nursing, allergic to albumin, have an infection, skin condition, or muscle weakness at the site of the injection, or have myasthenia gravis, Lambert-Eaton syndrome, or ALS.  It is also possible that as with any injection, there may be an allergic reaction or no effect from the medication. Reduced effectiveness after repeated injections is sometimes seen and rarely infection at the injection site may occur. All care will be taken to prevent these side effects. If  therapy is given over a long time, atrophy and wasting in the muscle injected may occur. Occasionally the patient's become refractory to treatment because they develop antibodies to the toxin. In this event, therapy needs to be modified.  I have read the above information and consent to the administration of botulism toxin.    BOTOX PROCEDURE NOTE FOR MIGRAINE HEADACHE    Contraindications and precautions discussed with patient(above). Aseptic procedure was observed and patient tolerated procedure. Procedure performed by Dr. Artemio Aly  The condition has existed for more than 6 months, and pt does not have a diagnosis of ALS, Myasthenia Gravis or Lambert-Eaton Syndrome.  Risks and benefits of injections discussed and pt agrees to proceed with the procedure.  Written consent obtained  These injections are medically necessary. Pt  receives good benefits from these injections. These injections do not cause sedations or hallucinations which the oral therapies may cause.  Description of procedure:  The patient was placed in a sitting position. The standard protocol was used for Botox as follows, with 5 units of Botox injected at each site:   -Procerus muscle, midline injection  -Corrugator muscle, bilateral injection  -Frontalis muscle, bilateral injection, with 2 sites each side, medial injection was performed in the upper one third of the frontalis muscle, in the region vertical from the medial inferior edge of the superior orbital rim. The lateral injection was again in the upper one third of the forehead vertically above the lateral limbus of the cornea, 1.5 cm lateral to the medial injection site.  -Temporalis muscle injection, 4 sites, bilaterally. The first injection was 3 cm above the  tragus of the ear, second injection site was 1.5 cm to 3 cm up from the first injection site in line with the tragus of the ear. The third injection site was 1.5-3 cm forward between the first 2 injection  sites. The fourth injection site was 1.5 cm posterior to the second injection site.   -Occipitalis muscle injection, 3 sites, bilaterally. The first injection was done one half way between the occipital protuberance and the tip of the mastoid process behind the ear. The second injection site was done lateral and superior to the first, 1 fingerbreadth from the first injection. The third injection site was 1 fingerbreadth superiorly and medially from the first injection site.  -Cervical paraspinal muscle injection, 2 sites, bilateral knee first injection site was 1 cm from the midline of the cervical spine, 3 cm inferior to the lower border of the occipital protuberance. The second injection site was 1.5 cm superiorly and laterally to the first injection site.  -Trapezius muscle injection was performed at 3 sites, bilaterally. The first injection site was in the upper trapezius muscle halfway between the inflection point of the neck, and the acromion. The second injection site was one half way between the acromion and the first injection site. The third injection was done between the first injection site and the inflection point of the neck.   Will return for repeat injection in 3 months.   155 unit sof Botox was used, 45u Botox not injected was wasted. The patient tolerated the procedure well, there were no complications of the above procedure.

## 2021-09-30 NOTE — Progress Notes (Addendum)
Botox- 200 units x 1 vial Lot: [id number] Expiration: 11/2023 NDC: 9767-3419-37  0.9% Sodium Chloride- 98mL total Lot: [id number] Expiration: 09/17/2022 NDC: 7353-2992-42  Dx: A83.419 S/P

## 2021-10-06 ENCOUNTER — Other Ambulatory Visit: Payer: Self-pay | Admitting: Neurology

## 2021-10-06 DIAGNOSIS — S060X9D Concussion with loss of consciousness of unspecified duration, subsequent encounter: Secondary | ICD-10-CM

## 2021-10-06 DIAGNOSIS — S060XAA Concussion with loss of consciousness status unknown, initial encounter: Secondary | ICD-10-CM

## 2021-10-07 ENCOUNTER — Telehealth: Payer: Self-pay | Admitting: Neurology

## 2021-10-07 DIAGNOSIS — S060X9D Concussion with loss of consciousness of unspecified duration, subsequent encounter: Secondary | ICD-10-CM

## 2021-10-07 DIAGNOSIS — S060XAA Concussion with loss of consciousness status unknown, initial encounter: Secondary | ICD-10-CM

## 2021-10-07 NOTE — Telephone Encounter (Signed)
Referral sent to [facility] [phone]. ?

## 2021-10-13 ENCOUNTER — Ambulatory Visit: Payer: 59 | Admitting: Neurology

## 2021-10-20 ENCOUNTER — Telehealth: Payer: Self-pay | Admitting: Neurology

## 2021-10-20 NOTE — Telephone Encounter (Signed)
Called pt and relayed Dr. Cathren Laine message. Pt was rude and stated that "it needed to be documented that appointment was not needed per MD and not per pt". Informed pt that message was coming directly from Dr. Jaynee Eagles and that appointment was not necessary. Pt was agreeable to cancelling.  ?

## 2021-10-20 NOTE — Telephone Encounter (Signed)
Destiny Ellis or Destiny Ellis (FY pod 4) This patient is supposed to come in Thursday morning to discuss her formal memory testing.  However I did already discuss this with her at last appointment, I am not a concussion expert and am not quite sure I can help patient.  At this time I am trying to find her a concussion center or a formal memory center where she can be seen again as I am not a concussion expert and I am really not quite sure I can help her with long-term concussion. We discussed this and agreed last time I saw her so I think she can cancel Thursday's appointent. Would you call please? And place the appointment on hold? Marchelle Folks do not call to confirm this appointment) ?

## 2021-10-23 ENCOUNTER — Telehealth: Payer: 59 | Admitting: Neurology

## 2021-11-03 NOTE — Telephone Encounter (Signed)
Per Dr Lucia Gaskins, new order for referral to [facility] Dr Terrilee Files placed.  ?

## 2021-11-03 NOTE — Addendum Note (Signed)
Addended by: Bertram Savin on: 11/03/2021 04:01 PM ? ? Modules accepted: Orders ? ?

## 2021-11-03 NOTE — Telephone Encounter (Signed)
Received a fax from [facility] from outpatient adult neurology stating "please note our department does not see adult patients for concussions" ? ?

## 2021-11-04 ENCOUNTER — Telehealth: Payer: Self-pay | Admitting: Family Medicine

## 2021-11-04 NOTE — Telephone Encounter (Signed)
Note added to referral and sent back to Dr Trevor Mace office. ? ?

## 2021-11-04 NOTE — Telephone Encounter (Signed)
Noted, thank you. Referral was sent via epic.  ?
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
## 2021-11-04 NOTE — Telephone Encounter (Signed)
Can also try the [facility] at [facility]: [email or web address].  Pt's most recent concussion is past our cut-off so would recommend trying [facility] (Dr. Tomi Likens) per Valerie's recommendation or if that's not an option, then I would direct her to the [facility] at [facility] (contact info is on website). ?

## 2021-11-06 ENCOUNTER — Other Ambulatory Visit: Payer: Self-pay | Admitting: Neurology

## 2021-11-06 ENCOUNTER — Other Ambulatory Visit: Payer: Self-pay | Admitting: *Deleted

## 2021-11-06 ENCOUNTER — Telehealth: Payer: Self-pay | Admitting: Neurology

## 2021-11-06 DIAGNOSIS — S060XAS Concussion with loss of consciousness status unknown, sequela: Secondary | ICD-10-CM

## 2021-11-06 MED ORDER — BOTOX 200 UNITS IJ SOLR: 155.0000 [IU] | INTRAMUSCULAR | 3 refills | Status: DC

## 2021-11-06 NOTE — Telephone Encounter (Signed)
Dr. Tamala Julian office's responded back: ? ?"Patient's most recent concussion is past out cut-off for the concussion clinic. Would recommend trying [facility] (Dr Tomi Likens) or the [facility] at [facility] ([email or web address])." ?

## 2021-11-06 NOTE — Telephone Encounter (Signed)
Please send Botox Rx Refill to [facility]. ?

## 2021-11-10 ENCOUNTER — Telehealth: Payer: Self-pay | Admitting: Neurology

## 2021-11-10 NOTE — Telephone Encounter (Addendum)
Referral sent to [facility] [phone]/ scheduling [phone]. ?

## 2021-11-12 NOTE — Telephone Encounter (Signed)
Referral sent to [facility] [phone]/ scheduling [phone]. ?

## 2021-11-28 ENCOUNTER — Other Ambulatory Visit: Payer: Self-pay | Admitting: Neurology

## 2021-12-15 ENCOUNTER — Emergency Department (HOSPITAL_BASED_OUTPATIENT_CLINIC_OR_DEPARTMENT_OTHER)
Admission: EM | Admit: 2021-12-15 | Discharge: 2021-12-16 | Disposition: A | Discharge status: Home / Self Care | Payer: 59 | Attending: Emergency Medicine | Admitting: Emergency Medicine

## 2021-12-15 ENCOUNTER — Emergency Department (HOSPITAL_BASED_OUTPATIENT_CLINIC_OR_DEPARTMENT_OTHER): Payer: 59

## 2021-12-15 ENCOUNTER — Other Ambulatory Visit: Payer: Self-pay

## 2021-12-15 ENCOUNTER — Encounter (HOSPITAL_BASED_OUTPATIENT_CLINIC_OR_DEPARTMENT_OTHER): Payer: Self-pay | Admitting: Emergency Medicine

## 2021-12-15 DIAGNOSIS — Z91040 Latex allergy status: Secondary | ICD-10-CM | POA: Insufficient documentation

## 2021-12-15 DIAGNOSIS — R102 Pelvic and perineal pain: Secondary | ICD-10-CM | POA: Diagnosis not present

## 2021-12-15 LAB — COMPREHENSIVE METABOLIC PANEL
ALT: 14 U/L (ref 0–44)
AST: 14 U/L — ABNORMAL LOW (ref 15–41)
Albumin: 4.6 g/dL (ref 3.5–5.0)
Alkaline Phosphatase: 42 U/L (ref 38–126)
Anion gap: 8 (ref 5–15)
BUN: 11 mg/dL (ref 6–20)
CO2: 20 mmol/L — ABNORMAL LOW (ref 22–32)
Calcium: 9.4 mg/dL (ref 8.9–10.3)
Chloride: 110 mmol/L (ref 98–111)
Creatinine, Ser: 0.78 mg/dL (ref 0.44–1.00)
GFR, Estimated: >60 mL/min (ref >60)
Glucose, Bld: 105 mg/dL — ABNORMAL HIGH (ref 70–99)
Potassium: 4.2 mmol/L (ref 3.5–5.1)
Sodium: 138 mmol/L (ref 135–145)
Total Bilirubin: 0.3 mg/dL (ref 0.3–1.2)
Total Protein: 7.6 g/dL (ref 6.5–8.1)

## 2021-12-15 LAB — CBC
HCT: 42.7 % (ref 36.0–46.0)
Hemoglobin: 13.5 g/dL (ref 12.0–15.0)
MCH: 30.7 pg (ref 26.0–34.0)
MCHC: 31.6 g/dL (ref 30.0–36.0)
MCV: 97 fL (ref 80.0–100.0)
Platelets: 371 10*3/uL (ref 150–400)
RBC: 4.4 MIL/uL (ref 3.87–5.11)
RDW: 12.8 % (ref 11.5–15.5)
WBC: 9.2 10*3/uL (ref 4.0–10.5)
nRBC: 0 % (ref 0.0–0.2)

## 2021-12-15 LAB — URINALYSIS, ROUTINE W REFLEX MICROSCOPIC
Bilirubin Urine: NEGATIVE
Glucose, UA: NEGATIVE mg/dL
Ketones, ur: NEGATIVE mg/dL
Nitrite: NEGATIVE
Protein, ur: NEGATIVE mg/dL
Specific Gravity, Urine: 1.015 (ref 1.005–1.030)
pH: 6 (ref 5.0–8.0)

## 2021-12-15 LAB — PREGNANCY, URINE: Preg Test, Ur: NEGATIVE

## 2021-12-15 LAB — LIPASE, BLOOD: Lipase: 40 U/L (ref 11–51)

## 2021-12-15 MED ORDER — MORPHINE SULFATE (PF) 4 MG/ML IV SOLN
4.0000 mg | Freq: Once | INTRAVENOUS | Status: AC
  Administered 2021-12-15: 4 mg via INTRAVENOUS
  Filled 2021-12-15: qty 1

## 2021-12-15 MED ORDER — HYDROMORPHONE HCL 1 MG/ML IJ SOLN
0.5000 mg | Freq: Once | INTRAMUSCULAR | Status: AC
  Administered 2021-12-15: 0.5 mg via INTRAVENOUS
  Filled 2021-12-15: qty 1

## 2021-12-15 MED ORDER — KETOROLAC TROMETHAMINE 15 MG/ML IJ SOLN
15.0000 mg | Freq: Once | INTRAMUSCULAR | Status: AC
  Administered 2021-12-15: 15 mg via INTRAVENOUS
  Filled 2021-12-15: qty 1

## 2021-12-15 MED ORDER — ONDANSETRON HCL 4 MG/2ML IJ SOLN
4.0000 mg | Freq: Once | INTRAMUSCULAR | Status: AC
  Administered 2021-12-15: 4 mg via INTRAVENOUS
  Filled 2021-12-15: qty 2

## 2021-12-15 MED ORDER — IOHEXOL 300 MG/ML  SOLN
100.0000 mL | Freq: Once | INTRAMUSCULAR | Status: AC | PRN
  Administered 2021-12-15: 80 mL via INTRAVENOUS

## 2021-12-15 MED ORDER — LACTATED RINGERS IV BOLUS
1000.0000 mL | Freq: Once | INTRAVENOUS | Status: AC
  Administered 2021-12-15: 1000 mL via INTRAVENOUS

## 2021-12-15 NOTE — ED Notes (Signed)
Patient transported to Ultrasound 
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
## 2021-12-15 NOTE — ED Notes (Signed)
Pt appears to be more comfortable; states she is closer to the "normal amount of pain I have every day." ?

## 2021-12-15 NOTE — ED Triage Notes (Signed)
Pt via pov from home with "severe" pelvic pain. Pt has endometriosis and "lives at 3-4 pain level" but states it has gotten considerably worse in last 48 hours. Pt states normal exacerbations last 24 hours, and this is more severe and has not gone away with medications. Pt denies any urinary symptoms, nad noted.  ?

## 2021-12-15 NOTE — ED Notes (Signed)
See triage note - pelvic pain x 48 hours. Hx endometriosis. Pt alert & oriented, nad noted.  ?

## 2021-12-16 NOTE — ED Notes (Signed)
Pt discharged home after verbalizing understanding of discharge instructions; nad noted. 

## 2021-12-16 NOTE — Discharge Instructions (Signed)
Today you received medications that may make you sleepy or impair your ability to make decisions.  For the next 24 hours please do not drive, operate heavy machinery, care for a small child with out another adult present, or perform any activities that may cause harm to you or someone else if you were to fall asleep or be impaired.  ? ?As you were given Toradol while in the emergency room please wait a minimum of 8 hours prior to taking ibuprofen, Aleve, naproxen or any other NSAIDs. ?

## 2021-12-23 ENCOUNTER — Ambulatory Visit (INDEPENDENT_AMBULATORY_CARE_PROVIDER_SITE_OTHER): Payer: 59 | Admitting: Neurology

## 2021-12-23 DIAGNOSIS — G43709 Chronic migraine without aura, not intractable, without status migrainosus: Secondary | ICD-10-CM | POA: Diagnosis not present

## 2021-12-23 NOTE — Progress Notes (Signed)
? ?Consent Form ?Botulism Toxin Injection For Chronic Migraine ? ?12/23/2021: Stable as far as migraines. Referred to the [facility] [phone]/ scheduling [phone]. At [place]. Has an appointment in July ? ?09/30/2021: Stable, still > 70% improvement in migraine frequency with botox.  ? ?Reviewed orally with patient, additionally signature is on file: ? ?Botulism toxin has been approved by the Federal drug administration for treatment of chronic migraine. Botulism toxin does not cure chronic migraine and it may not be effective in some patients. ? ?The administration of botulism toxin is accomplished by injecting a small amount of toxin into the muscles of the neck and head. Dosage must be titrated for each individual. Any benefits resulting from botulism toxin tend to wear off after 3 months with a repeat injection required if benefit is to be maintained. Injections are usually done every 3-4 months with maximum effect peak achieved by about 2 or 3 weeks. Botulism toxin is expensive and you should be sure of what costs you will incur resulting from the injection. ? ?The side effects of botulism toxin use for chronic migraine may include: ? ? -Transient, and usually mild, facial weakness with facial injections ? -Transient, and usually mild, head or neck weakness with head/neck injections ? -Reduction or loss of forehead facial animation due to forehead muscle weakness ? -Eyelid drooping ? -Dry eye ? -Pain at the site of injection or bruising at the site of injection ? -Double vision ? -Potential unknown long term risks ? ?Contraindications: You should not have Botox if you are pregnant, nursing, allergic to albumin, have an infection, skin condition, or muscle weakness at the site of the injection, or have myasthenia gravis, Lambert-Eaton syndrome, or ALS. ? ?It is also possible that as with any injection, there may be an allergic reaction or no effect from the medication. Reduced  effectiveness after repeated injections is sometimes seen and rarely infection at the injection site may occur. All care will be taken to prevent these side effects. If therapy is given over a long time, atrophy and wasting in the muscle injected may occur. Occasionally the patient's become refractory to treatment because they develop antibodies to the toxin. In this event, therapy needs to be modified. ? ?I have read the above information and consent to the administration of botulism toxin. ? ? ? ?BOTOX PROCEDURE NOTE FOR MIGRAINE HEADACHE ? ? ? ?Contraindications and precautions discussed with patient(above). Aseptic procedure was observed and patient tolerated procedure. Procedure performed by Dr. Artemio Aly ? ?The condition has existed for more than 6 months, and pt does not have a diagnosis of ALS, Myasthenia Gravis or Lambert-Eaton Syndrome.  Risks and benefits of injections discussed and pt agrees to proceed with the procedure.  Written consent obtained ? ?These injections are medically necessary. Pt  receives good benefits from these injections. These injections do not cause sedations or hallucinations which the oral therapies may cause. ? ?Description of procedure: ? ?The patient was placed in a sitting position. The standard protocol was used for Botox as follows, with 5 units of Botox injected at each site: ? ? ?-Procerus muscle, midline injection ? ?-Corrugator muscle, bilateral injection ? ?-Frontalis muscle, bilateral injection, with 2 sites each side, medial injection was performed in the upper one third of the frontalis muscle, in the region vertical from the medial inferior edge of the superior orbital rim. The lateral injection was again in the upper one third of the forehead vertically above the lateral limbus of the  cornea, 1.5 cm lateral to the medial injection site. ? ?-Temporalis muscle injection, 4 sites, bilaterally. The first injection was 3 cm above the tragus of the ear, second injection  site was 1.5 cm to 3 cm up from the first injection site in line with the tragus of the ear. The third injection site was 1.5-3 cm forward between the first 2 injection sites. The fourth injection site was 1.5 cm posterior to the second injection site.  ? ?-Occipitalis muscle injection, 3 sites, bilaterally. The first injection was done one half way between the occipital protuberance and the tip of the mastoid process behind the ear. The second injection site was done lateral and superior to the first, 1 fingerbreadth from the first injection. The third injection site was 1 fingerbreadth superiorly and medially from the first injection site. ? ?-Cervical paraspinal muscle injection, 2 sites, bilateral knee first injection site was 1 cm from the midline of the cervical spine, 3 cm inferior to the lower border of the occipital protuberance. The second injection site was 1.5 cm superiorly and laterally to the first injection site. ? ?-Trapezius muscle injection was performed at 3 sites, bilaterally. The first injection site was in the upper trapezius muscle halfway between the inflection point of the neck, and the acromion. The second injection site was one half way between the acromion and the first injection site. The third injection was done between the first injection site and the inflection point of the neck. ? ? ?Will return for repeat injection in 3 months. ? ? ?155 unit sof Botox was used, 45u Botox not injected was wasted. The patient tolerated the procedure well, there were no complications of the above procedure. ? ? ?

## 2021-12-23 NOTE — Progress Notes (Signed)
Botox consent signed ? ?Botox- 200 units x 1 vial ?Lot: [id number] ?Expiration: 07/2024 ?NDC: [phone] ? ?Bacteriostatic 0.9% Sodium Chloride- 23mL total ?Lot: [id number] ?Expiration: 03/18/2023 ?NDC: 7829-5621-30 ? ?Dx: [phone] ?S/P ?

## 2021-12-31 ENCOUNTER — Other Ambulatory Visit: Payer: Self-pay | Admitting: Adult Health

## 2022-01-26 NOTE — Progress Notes (Unsigned)
Cardiology Office Note    Date:  01/28/2022   ID:  Destiny Ellis, DOB [birth year], MRN [id number]   PCP:  Willey Blade, [place]  Cardiologist:  Candee Furbish, MD   Advanced Practice Provider:  No care team member to display Electrophysiologist:  None   [phone]   Chief Complaint  Patient presents with   Palpitations    History of Present Illness:  Destiny Ellis is a 31 y.o. female with history of SVT and NSVT.  Patient last saw Dr. Marlou Porch 09/03/2021 and stopped taking metoprolol regularly and only takes it as needed.  Blood pressure tends to run low.  Patient has been having 5-6 episodes in the past month of rapid HR's. She was driving in May and HR was in 130-140's and became nausea and lightheaded. Drinks a 16 ounce coffee daily. 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 Has been taking propanolol instead of metoprolol because it makes her feel so bad. BP drops low. Also has a soreness in her chest after these episodes. When she tries to exercise her HR goes up to the 120's and doesn't come down.    Past Medical History:  Diagnosis Date   Anxiety    Depression    Dizziness 2020   pt states she attributes this to "sinus pressure & pollen being out of control"   Dysmenorrhea    Endometriosis    Headache    Migraines    WITH aura   Ovarian cyst    Seasonal allergies    Upper respiratory infection 2019   severe, required chest xrays, Abx, steroids, heart echo   Vision abnormalities     Past Surgical History:  Procedure Laterality Date   LAPAROSCOPY ABDOMEN DIAGNOSTIC     laporoscopy     PELVIC LAPAROSCOPY  2016   Dx'd endometriosis   TONSILLECTOMY      Current Medications: Current Meds  Medication Sig   AJOVY 225 MG/1.5ML SOAJ INJECT 225 MG INTO THE SKIN EVERY 30 (THIRTY) DAYS.   albuterol (VENTOLIN HFA) 108 (90 Base) MCG/ACT inhaler Inhale 2 puffs into the lungs as needed for shortness of breath.   Botulinum Toxin  Type A (BOTOX) 200 units SOLR Inject 155 Units into the muscle every 3 (three) months.   buPROPion (WELLBUTRIN XL) 150 MG 24 hr tablet TAKE 1 TABLET BY MOUTH EVERY DAY IN THE MORNING   butalbital-acetaminophen-caffeine (FIORICET, ESGIC) 50-325-40 MG tablet Take 1-2 tabs every 6 hours as needed for headache   cetirizine (ZYRTEC) 10 MG tablet Take 10 mg by mouth daily.   clonazePAM (KLONOPIN) 0.5 MG tablet Take 0.5 mg by mouth daily.   cyclobenzaprine (FLEXERIL) 10 MG tablet Take 10 mg by mouth 3 (three) times daily as needed.   diazepam (VALIUM) 5 MG tablet 5 mg as needed. Muscle spasms   diltiazem (CARDIZEM) 30 MG tablet Take 1 tablet (30 mg total) by mouth as needed (for SVT).   gabapentin (NEURONTIN) 100 MG capsule TAKE 1 CAPSULE BY MOUTH THREE TIMES A DAY.   indomethacin (INDOCIN) 50 MG capsule Take 1 capsule (50 mg total) by mouth 3 (three) times daily with meals. (Patient taking differently: Take 50 mg by mouth as needed (headache).)   JUNEL 1/20 1-20 MG-MCG tablet TAKE 1 TABLET BY MOUTH EVERY DAY   montelukast (SINGULAIR) 10 MG tablet daily.   norethindrone (AYGESTIN) 5 MG tablet Take 2.5-5 mg by mouth daily.   ondansetron (ZOFRAN) 8 MG tablet TAKE  1 TABLET BY MOUTH EVERY 8 HOURS AS NEEDED FOR NAUSEA AND VOMITING   oxyCODONE (OXY IR/ROXICODONE) 5 MG immediate release tablet Take 5 mg by mouth every 8 (eight) hours as needed for pain.   propranolol (INDERAL) 10 MG tablet 5 mg as needed. High heart rate   spironolactone (ALDACTONE) 25 MG tablet Take 25 mg by mouth daily.   SUMAtriptan (TOSYMRA) 10 MG/ACT SOLN Place 10 mg into the nose every hour. Maximum 3 doses per day. (Patient taking differently: Place 10 mg into the nose as needed (migraines).)   topiramate (TOPAMAX) 100 MG tablet Take 100 mg by mouth daily.     Allergies:   Latex, Other, Sulfa antibiotics, and Sulfa antibiotics   Social History   Socioeconomic History   Marital status: Divorced    Spouse name: Not on file    Number of children: Not on file   Years of education: Not on file   Highest education level: Not on file  Occupational History   Not on file  Tobacco Use   Smoking status: Never   Smokeless tobacco: Never  Vaping Use   Vaping Use: Never used  Substance and Sexual Activity   Alcohol use: Yes    Comment: occasional   Drug use: Never   Sexual activity: Yes    Birth control/protection: Pill    Comment: Junel 1/20  Other Topics Concern   Not on file  Social History Narrative   Caffeine: maybe 1 cup/day   Right handed   Lives alone       ** Merged History Encounter **    Social Determinants of Health   Financial Resource Strain: Not on file  Food Insecurity: Not on file  Transportation Needs: Not on file  Physical Activity: Not on file  Stress: Not on file  Social Connections: Not on file     Family History:  The patient's  family history includes Bipolar disorder in her father; Healthy in her brother; Hyperlipidemia in her maternal grandfather and maternal grandmother; Hypertension in her father; Hyperthyroidism in her mother; Migraines in her father; Stroke in her maternal grandmother; Thyroid disease in her mother.   ROS:   Please see the history of present illness.    ROS All other systems reviewed and are negative.   PHYSICAL EXAM:   VS:  BP 118/80   Pulse 80   Ht 5\' 3"  (1.6 m)   Wt 173 lb (78.5 kg)   SpO2 99%   BMI 30.65 kg/m   Physical Exam  GEN: Well nourished, well developed, in no acute distress  Neck: no JVD, carotid bruits, or masses Cardiac:RRR; no murmurs, rubs, or gallops  Respiratory:  clear to auscultation bilaterally, normal work of breathing GI: soft, nontender, nondistended, + BS Ext: without cyanosis, clubbing, or edema, Good distal pulses bilaterally Neuro:  Alert and Oriented x 3,  Psych: euthymic mood, full affect  Wt Readings from Last 3 Encounters:  01/28/22 173 lb (78.5 kg)  12/15/21 167 lb 15.9 oz (76.2 kg)  09/03/21 168 lb (76.2  kg)      Studies/Labs Reviewed:   EKG:  EKG is  ordered today.  The ekg ordered today demonstrates NSR normal EKG  Recent Labs: 04/11/2021: Magnesium 2.1; TSH 2.260 12/15/2021: ALT 14; BUN 11; Creatinine, Ser 0.78; Hemoglobin 13.5; Platelets 371; Potassium 4.2; Sodium 138   Lipid Panel No results found for: "CHOL", "TRIG", "HDL", "CHOLHDL", "VLDL", "LDLCALC", "LDLDIRECT"  Additional studies/ records that were reviewed today include:  Coronary CTA 07/02/2021 IMPRESSION: 1. Coronary calcium score of 0. This was 0 percentile for age and sex matched control.   2. Normal coronary origin with right dominance.   3. No evidence of CAD. No evidence of coronary anomalies.   4. Calculated ejection fraction 63%.  Normal.   Echo 07/02/2021: Sonographer Comments: Image acquisition challenging due to respiratory  motion.  IMPRESSIONS    1. Left ventricular ejection fraction, by estimation, is 60 to 65%. The  left ventricle has normal function. The left ventricle has no regional  wall motion abnormalities. Left ventricular diastolic parameters were  normal.   2. Right ventricular systolic function is normal. The right ventricular  size is normal.   3. The mitral valve is normal in structure. No evidence of mitral valve  regurgitation. No evidence of mitral stenosis.   4. The aortic valve is normal in structure. Aortic valve regurgitation is  not visualized. No aortic stenosis is present.   5. The inferior vena cava is normal in size with greater than 50%  respiratory variability, suggesting right atrial pressure of 3 mmHg.    Monitor 06/2021: Normal sinus rhythm No atrial fibrillation Rare PVC's and PAC's On 10/19, there were 22 beats of ventricular tachycardia avg HR 155 bpm, irregular, associated with fluttering (page 147) Several symptoms of lightheaded, dizzy, fatigue, chest pain associated with normal sinus rhythm/ mild sinus tachycardia Brief episode of atrial tahcycardia    ECHO and cardiac CT reassuring (normal pump function, no coronary anomalies.) Continue with low dose metoprolol with additional tablet as needed.   She is aware of results.    Risk Assessment/Calculations:         ASSESSMENT:    1. PSVT (paroxysmal supraventricular tachycardia) ([facility])   2. Nonsustained ventricular tachycardia (HCC)      PLAN:  In order of problems listed above:  PSVT metoprolol as needed but doesn't tolerate well and drops her BP. Increase in SVT in the past month. Will try diltiazem 30 mg prn and refer to EPS for possible ablation. Labs in May were normal.  NSVT echo with normal heart function and no evidence of coronary disease on CT.  No history of syncope currently on metoprolol as needed  Shared Decision Making/Informed Consent        Medication Adjustments/Labs and Tests Ordered: Current medicines are reviewed at length with the patient today.  Concerns regarding medicines are outlined above.  Medication changes, Labs and Tests ordered today are listed in the Patient Instructions below. Patient Instructions  Medication Instructions:  Your physician has recommended you make the following change in your medication:   START: Diltiazem 30mg  as needed for SVT  *If you need a refill on your cardiac medications before your next appointment, please call your pharmacy*   Lab Work: None If you have labs (blood work) drawn today and your tests are completely normal, you will receive your results only by: [place] (if you have MyChart) OR A paper copy in the mail If you have any lab test that is abnormal or we need to change your treatment, we will call you to review the results.  Follow-Up: At [facility], you and your health needs are our priority.  As part of our continuing mission to provide you with exceptional heart care, we have created designated Provider Care Teams.  These Care Teams include your primary Cardiologist (physician) and  Advanced Practice Providers (APPs -  Physician Assistants and Nurse Practitioners) who all work together to provide  you with the care you need, when you need it.  Your next appointment:   6 month(s)  The format for your next appointment:   In Person  Provider:   Candee Furbish, MD     Other Instructions  Diltiazem Tablets What is this medication? DILTIAZEM (dil TYE a zem) treats high blood pressure and prevents chest pain (angina). It works by relaxing the blood vessels, which helps decrease the amount of work your heart has to do. It belongs to a group of medications called calcium channel blockers. This medicine may be used for other purposes; ask your health care provider or pharmacist if you have questions. COMMON BRAND NAME(S): Cardizem What should I tell my care team before I take this medication? They need to know if you have any of these conditions: Heart attack Heart disease Irregular heartbeat or rhythm Low blood pressure An unusual or allergic reaction to diltiazem, other medications, foods, dyes, or preservatives Pregnant or trying to get pregnant Breast-feeding How should I use this medication? Take this medication by mouth. Take it as directed on the prescription label at the same time every day. Keep taking it unless your care team tells you to stop. Talk to your care team about the use of this medication in children. Special care may be needed. Overdosage: If you think you have taken too much of this medicine contact a poison control center or emergency room at once. NOTE: This medicine is only for you. Do not share this medicine with others. What if I miss a dose? If you miss a dose, take it as soon as you can. If it is almost time for your next dose, take only that dose. Do not take double or extra doses. What may interact with this medication? Do not take this medication with any of the following: Cisapride [place] Pimozide Ranolazine Red yeast rice This  medication may also interact with the following: Buspirone Carbamazepine Cimetidine Cyclosporine Digoxin Local anesthetics or general anesthetics Lovastatin Medications for anxiety or difficulty sleeping like midazolam and triazolam Medications for high blood pressure or heart problems Quinidine Rifampin, rifabutin, or rifapentine This list may not describe all possible interactions. Give your health care provider a list of all the medicines, herbs, non-prescription drugs, or dietary supplements you use. Also tell them if you smoke, drink alcohol, or use illegal drugs. Some items may interact with your medicine. What should I watch for while using this medication? Visit your care team for regular checks on your progress. Check your blood pressure as directed. Ask your care team what your blood pressure should be. Also, find out when you should contact them. Do not treat yourself for coughs, colds, or pain while you are using this medication without asking your care team for advice. Some medications may increase your blood pressure. This medication may cause serious skin reactions. They can happen weeks to months after starting the medication. Contact your care team right away if you notice fevers or flu-like symptoms with a rash. The rash may be red or purple and then turn into blisters or peeling of the skin. Or, you might notice a red rash with swelling of the face, lips or lymph nodes in your neck or under your arms. You may get drowsy or dizzy. Do not drive, use machinery, or do anything that needs mental alertness until you know how this medication affects you. Do not stand up or sit up quickly, especially if you are an older patient. This reduces the risk  of dizzy or fainting spells. What side effects may I notice from receiving this medication? Side effects that you should report to your care team as soon as possible: Allergic reactions--skin rash, itching, hives, swelling of the face,  lips, tongue, or throat Heart failure--shortness of breath, swelling of the ankles, feet, or hands, sudden weight gain, unusual weakness or fatigue Slow heart beat--dizziness, feeling faint or lightheaded, trouble breathing, unusual weakness or fatigue Liver injury--right upper belly pain, loss of appetite, nausea, light-colored stool, dark yellow or brown urine, yellowing skin or eyes, unusual weakness or fatigue Low blood pressure--dizziness, feeling faint or lightheaded, blurry vision Redness, blistering, peeling, or loosening of the skin, including inside the mouth Side effects that usually do not require medical attention (report to your care team if they continue or are bothersome): Constipation Facial flushing, redness Headache This list may not describe all possible side effects. Call your doctor for medical advice about side effects. You may report side effects to FDA at 1-800-FDA-1088. Where should I keep my medication? Keep out of the reach of children and pets. Store at room temperature between 15 and 30 degrees C (59 and 86 degrees F). Protect from moisture. Keep the container tightly closed. Throw away any unused medication after the expiration date. NOTE: This sheet is a summary. It may not cover all possible information. If you have questions about this medicine, talk to your doctor, pharmacist, or health care provider.  2023 Elsevier/Gold Standard (2021-07-04 00:00:00)       Signed, Ermalinda Barrios, PA-C  01/28/2022 11:28 AM    [facility] [place], [place], [place]  [address] Phone: [phone]; Fax: [phone]

## 2022-01-28 ENCOUNTER — Ambulatory Visit (INDEPENDENT_AMBULATORY_CARE_PROVIDER_SITE_OTHER): Payer: 59 | Admitting: Physician Assistant

## 2022-01-28 ENCOUNTER — Encounter: Payer: Self-pay | Admitting: Physician Assistant

## 2022-01-28 VITALS — BP 118/80 | HR 80 | Ht 63.0 in | Wt 173.0 lb

## 2022-01-28 DIAGNOSIS — I4729 Other ventricular tachycardia: Secondary | ICD-10-CM

## 2022-01-28 DIAGNOSIS — I471 Supraventricular tachycardia: Secondary | ICD-10-CM

## 2022-01-28 MED ORDER — DILTIAZEM HCL 30 MG PO TABS
30.0000 mg | ORAL_TABLET | ORAL | 6 refills | Status: DC | PRN
Start: 2022-01-28 — End: 2022-09-20

## 2022-01-28 NOTE — Patient Instructions (Signed)
Medication Instructions:  Your physician has recommended you make the following change in your medication:   START: Diltiazem 30mg  as needed for SVT  *If you need a refill on your cardiac medications before your next appointment, please call your pharmacy*   Lab Work: None If you have labs (blood work) drawn today and your tests are completely normal, you will receive your results only by: [place] (if you have MyChart) OR A paper copy in the mail If you have any lab test that is abnormal or we need to change your treatment, we will call you to review the results.  Follow-Up: At [facility], you and your health needs are our priority.  As part of our continuing mission to provide you with exceptional heart care, we have created designated Provider Care Teams.  These Care Teams include your primary Cardiologist (physician) and Advanced Practice Providers (APPs -  Physician Assistants and Nurse Practitioners) who all work together to provide you with the care you need, when you need it.  Your next appointment:   6 month(s)  The format for your next appointment:   In Person  Provider:   Candee Furbish, MD     Other Instructions  Diltiazem Tablets What is this medication? DILTIAZEM (dil TYE a zem) treats high blood pressure and prevents chest pain (angina). It works by relaxing the blood vessels, which helps decrease the amount of work your heart has to do. It belongs to a group of medications called calcium channel blockers. This medicine may be used for other purposes; ask your health care provider or pharmacist if you have questions. COMMON BRAND NAME(S): Cardizem What should I tell my care team before I take this medication? They need to know if you have any of these conditions: Heart attack Heart disease Irregular heartbeat or rhythm Low blood pressure An unusual or allergic reaction to diltiazem, other medications, foods, dyes, or preservatives Pregnant or trying to  get pregnant Breast-feeding How should I use this medication? Take this medication by mouth. Take it as directed on the prescription label at the same time every day. Keep taking it unless your care team tells you to stop. Talk to your care team about the use of this medication in children. Special care may be needed. Overdosage: If you think you have taken too much of this medicine contact a poison control center or emergency room at once. NOTE: This medicine is only for you. Do not share this medicine with others. What if I miss a dose? If you miss a dose, take it as soon as you can. If it is almost time for your next dose, take only that dose. Do not take double or extra doses. What may interact with this medication? Do not take this medication with any of the following: Cisapride [place] Pimozide Ranolazine Red yeast rice This medication may also interact with the following: Buspirone Carbamazepine Cimetidine Cyclosporine Digoxin Local anesthetics or general anesthetics Lovastatin Medications for anxiety or difficulty sleeping like midazolam and triazolam Medications for high blood pressure or heart problems Quinidine Rifampin, rifabutin, or rifapentine This list may not describe all possible interactions. Give your health care provider a list of all the medicines, herbs, non-prescription drugs, or dietary supplements you use. Also tell them if you smoke, drink alcohol, or use illegal drugs. Some items may interact with your medicine. What should I watch for while using this medication? Visit your care team for regular checks on your progress. Check your blood pressure as  directed. Ask your care team what your blood pressure should be. Also, find out when you should contact them. Do not treat yourself for coughs, colds, or pain while you are using this medication without asking your care team for advice. Some medications may increase your blood pressure. This medication may cause  serious skin reactions. They can happen weeks to months after starting the medication. Contact your care team right away if you notice fevers or flu-like symptoms with a rash. The rash may be red or purple and then turn into blisters or peeling of the skin. Or, you might notice a red rash with swelling of the face, lips or lymph nodes in your neck or under your arms. You may get drowsy or dizzy. Do not drive, use machinery, or do anything that needs mental alertness until you know how this medication affects you. Do not stand up or sit up quickly, especially if you are an older patient. This reduces the risk of dizzy or fainting spells. What side effects may I notice from receiving this medication? Side effects that you should report to your care team as soon as possible: Allergic reactions--skin rash, itching, hives, swelling of the face, lips, tongue, or throat Heart failure--shortness of breath, swelling of the ankles, feet, or hands, sudden weight gain, unusual weakness or fatigue Slow heart beat--dizziness, feeling faint or lightheaded, trouble breathing, unusual weakness or fatigue Liver injury--right upper belly pain, loss of appetite, nausea, light-colored stool, dark yellow or brown urine, yellowing skin or eyes, unusual weakness or fatigue Low blood pressure--dizziness, feeling faint or lightheaded, blurry vision Redness, blistering, peeling, or loosening of the skin, including inside the mouth Side effects that usually do not require medical attention (report to your care team if they continue or are bothersome): Constipation Facial flushing, redness Headache This list may not describe all possible side effects. Call your doctor for medical advice about side effects. You may report side effects to FDA at 1-800-FDA-1088. Where should I keep my medication? Keep out of the reach of children and pets. Store at room temperature between 15 and 30 degrees C (59 and 86 degrees F). Protect from  moisture. Keep the container tightly closed. Throw away any unused medication after the expiration date. NOTE: This sheet is a summary. It may not cover all possible information. If you have questions about this medicine, talk to your doctor, pharmacist, or health care provider.  2023 Elsevier/Gold Standard (2021-07-04 00:00:00)

## 2022-03-09 ENCOUNTER — Emergency Department (HOSPITAL_COMMUNITY): Payer: 59

## 2022-03-09 ENCOUNTER — Encounter (HOSPITAL_COMMUNITY): Payer: Self-pay | Admitting: Emergency Medicine

## 2022-03-09 ENCOUNTER — Emergency Department (HOSPITAL_COMMUNITY)
Admission: EM | Admit: 2022-03-09 | Discharge: 2022-03-10 | Disposition: A | Discharge status: Home / Self Care | Payer: 59 | Attending: Emergency Medicine | Admitting: Emergency Medicine

## 2022-03-09 ENCOUNTER — Other Ambulatory Visit: Payer: Self-pay

## 2022-03-09 DIAGNOSIS — Z91040 Latex allergy status: Secondary | ICD-10-CM | POA: Diagnosis not present

## 2022-03-09 DIAGNOSIS — N2589 Other disorders resulting from impaired renal tubular function: Secondary | ICD-10-CM | POA: Diagnosis not present

## 2022-03-09 DIAGNOSIS — M25511 Pain in right shoulder: Secondary | ICD-10-CM | POA: Diagnosis not present

## 2022-03-09 DIAGNOSIS — Z79899 Other long term (current) drug therapy: Secondary | ICD-10-CM | POA: Insufficient documentation

## 2022-03-09 DIAGNOSIS — R072 Precordial pain: Secondary | ICD-10-CM | POA: Insufficient documentation

## 2022-03-09 DIAGNOSIS — E872 Acidosis, unspecified: Secondary | ICD-10-CM | POA: Insufficient documentation

## 2022-03-09 DIAGNOSIS — R200 Anesthesia of skin: Secondary | ICD-10-CM | POA: Insufficient documentation

## 2022-03-09 DIAGNOSIS — R002 Palpitations: Secondary | ICD-10-CM | POA: Insufficient documentation

## 2022-03-09 LAB — BASIC METABOLIC PANEL
Anion gap: 11 (ref 5–15)
BUN: 9 mg/dL (ref 6–20)
CO2: 17 mmol/L — ABNORMAL LOW (ref 22–32)
Calcium: 9.4 mg/dL (ref 8.9–10.3)
Chloride: 110 mmol/L (ref 98–111)
Creatinine, Ser: 0.93 mg/dL (ref 0.44–1.00)
GFR, Estimated: >60 mL/min (ref >60)
Glucose, Bld: 83 mg/dL (ref 70–99)
Potassium: 3.6 mmol/L (ref 3.5–5.1)
Sodium: 138 mmol/L (ref 135–145)

## 2022-03-09 LAB — CBC
HCT: 42.6 % (ref 36.0–46.0)
Hemoglobin: 14.1 g/dL (ref 12.0–15.0)
MCH: 31.2 pg (ref 26.0–34.0)
MCHC: 33.1 g/dL (ref 30.0–36.0)
MCV: 94.2 fL (ref 80.0–100.0)
Platelets: 342 10*3/uL (ref 150–400)
RBC: 4.52 MIL/uL (ref 3.87–5.11)
RDW: 12.1 % (ref 11.5–15.5)
WBC: 9 10*3/uL (ref 4.0–10.5)
nRBC: 0 % (ref 0.0–0.2)

## 2022-03-09 LAB — MAGNESIUM: Magnesium: 2 mg/dL (ref 1.7–2.4)

## 2022-03-09 LAB — TROPONIN I (HIGH SENSITIVITY): Troponin I (High Sensitivity): 3 ng/L (ref <18)

## 2022-03-09 LAB — I-STAT BETA HCG BLOOD, ED (MC, WL, AP ONLY): I-stat hCG, quantitative: <5 m[IU]/mL (ref <5)

## 2022-03-09 LAB — TSH: TSH: 1.634 u[IU]/mL (ref 0.350–4.500)

## 2022-03-09 NOTE — ED Provider Triage Note (Signed)
Emergency Medicine Provider Triage Evaluation Note  Destiny Ellis , a 31 y.o. female  was evaluated in triage.  Pt complains of palpitations and chest pain.  History of POTS.  Patient states that her resting heart rate is normally fairly high.  She noticed today that she was having right sided facial numbness, pain in the face and jaw and right shoulder and in some numbness in her right shoulder.  That is new for her.  That is why she came to the emergency department.  She denies any near syncope.  She has had extensive cardiac work-up.  Review of Systems  Positive: Palpitations Negative: Loss of consciousness  Physical Exam  BP (!) 136/95 (BP Location: Left Arm)   Pulse (!) 109   Temp 97.7 F (36.5 C)   Resp 17   SpO2 100%  Gen:   Awake, no distress   Resp:  Normal effort  MSK:   Moves extremities without difficulty  Other:  Appears anxious, mild tachycardia  Medical Decision Making  Medically screening exam initiated at 1:37 PM.  Appropriate orders placed.  Marrah Vanevery was informed that the remainder of the evaluation will be completed by another provider, this initial triage assessment does not replace that evaluation, and the importance of remaining in the ED until their evaluation is complete.  Work-up initiated   Arthor Captain, PA-C 03/09/22 1344

## 2022-03-09 NOTE — ED Triage Notes (Signed)
Patient here with racing heart, chest pain and middle back pain, numbness in the jaw that started today. Patient recently diagnosed POTS. Aox4.

## 2022-03-09 NOTE — ED Notes (Addendum)
Pt came over to NT stating that her apple watch was saying "my heart rate is in the 140's" NT retaking vitals at this time. Heart rate is in the 130's. BP 147/109. Will notify triage RN.

## 2022-03-10 LAB — TROPONIN I (HIGH SENSITIVITY): Troponin I (High Sensitivity): 3 ng/L (ref <18)

## 2022-03-10 MED ORDER — POTASSIUM CHLORIDE CRYS ER 20 MEQ PO TBCR: 20.0000 meq | EXTENDED_RELEASE_TABLET | Freq: Every day | ORAL | 0 refills | Status: DC

## 2022-03-10 MED ORDER — ACETAMINOPHEN 325 MG PO TABS
650.0000 mg | ORAL_TABLET | Freq: Once | ORAL | Status: AC
  Administered 2022-03-10: 650 mg via ORAL
  Filled 2022-03-10: qty 2

## 2022-03-10 MED ORDER — POTASSIUM CHLORIDE CRYS ER 20 MEQ PO TBCR
40.0000 meq | EXTENDED_RELEASE_TABLET | Freq: Once | ORAL | Status: AC
  Administered 2022-03-10: 40 meq via ORAL
  Filled 2022-03-10: qty 2

## 2022-03-10 NOTE — Discharge Instructions (Addendum)
Please discuss with your neurologist and gynecologist if there is an alternate medication to topiramate that might give you similar relief of symptoms.  Consider getting one of the apps for your phone which can identify your heart rhythm.

## 2022-03-10 NOTE — ED Provider Notes (Signed)
[facility] EMERGENCY DEPARTMENT Provider Note   CSN: [id number] Arrival date & time: 03/09/22  1324     History  Chief Complaint  Patient presents with   Palpitations    Destiny Ellis is a 31 y.o. female.  The history is provided by the patient.  Palpitations She has history of nonsustained ventricular tachycardia and comes in complaining of palpitations, right-sided facial numbness, chest pain.  She has a long history of palpitations, and has heart rates as high as 170.  Most of the time, her heart rate is relatively modestly elevated as high as 110-120.  She usually has a sense that her heart rate is about to go up before it actually goes up.  When her heart rate does go up, she feels her heart pounding in her chest and does get some dyspnea.  This will usually last less than a minute.  Over the last week, there have been 3 episodes where with the palpitation she has developed some numbness in the right side of her face with some pain in the right shoulder.  This will also last a relatively short period of time such as 5-10 minutes.  She has not had any headache with this.  Chest pain started today and is described as a midsternal pressure with some associated dyspnea.  She has chronic nausea related to endometriosis and this has not changed.  She has not had any diaphoresis.  While in the waiting room, she had another episode of her heart racing, but was not able to have her ECG checked during it.   Home Medications Prior to Admission medications   Medication Sig Start Date End Date Taking? Authorizing Provider  AJOVY 225 MG/1.5ML SOAJ INJECT 225 MG INTO THE SKIN EVERY 30 (THIRTY) DAYS. 12/31/21   Melvenia Beam, MD  albuterol (VENTOLIN HFA) 108 (90 Base) MCG/ACT inhaler Inhale 2 puffs into the lungs as needed for shortness of breath. 12/21/21   [provider]  Botulinum Toxin Type A (BOTOX) 200 units SOLR Inject 155 Units into the muscle every 3 (three)  months. 11/06/21   Melvenia Beam, MD  buPROPion (WELLBUTRIN XL) 150 MG 24 hr tablet TAKE 1 TABLET BY MOUTH EVERY DAY IN THE MORNING 08/25/21   Melvenia Beam, MD  butalbital-acetaminophen-caffeine (FIORICET, ESGIC) [phone] MG tablet Take 1-2 tabs every 6 hours as needed for headache 03/02/18   Melvenia Beam, MD  cetirizine (ZYRTEC) 10 MG tablet Take 10 mg by mouth daily.    [provider]  clonazePAM (KLONOPIN) 0.5 MG tablet Take 0.5 mg by mouth daily. 12/21/21   [provider]  cyclobenzaprine (FLEXERIL) 10 MG tablet Take 10 mg by mouth 3 (three) times daily as needed. 06/27/21   [provider]  diazepam (VALIUM) 5 MG tablet 5 mg as needed. Muscle spasms 12/18/21   [provider]  diltiazem (CARDIZEM) 30 MG tablet Take 1 tablet (30 mg total) by mouth as needed (for SVT). 01/28/22   Imogene Burn, PA-C  gabapentin (NEURONTIN) 100 MG capsule TAKE 1 CAPSULE BY MOUTH THREE TIMES A DAY. 06/19/20   Melvenia Beam, MD  indomethacin (INDOCIN) 50 MG capsule Take 1 capsule (50 mg total) by mouth 3 (three) times daily with meals. Patient taking differently: Take 50 mg by mouth as needed (headache). 09/19/20   Melvenia Beam, MD  JUNEL 1/20 1-20 MG-MCG tablet TAKE 1 TABLET BY MOUTH EVERY DAY 11/07/19   Melvenia Beam, MD  montelukast (SINGULAIR) 10 MG tablet daily. 07/28/16   [provider]  norethindrone (AYGESTIN) 5 MG tablet Take 2.5-5 mg by mouth daily. 07/09/21   [provider]  ondansetron (ZOFRAN) 8 MG tablet TAKE 1 TABLET BY MOUTH EVERY 8 HOURS AS NEEDED FOR NAUSEA AND VOMITING 12/03/21   Melvenia Beam, MD  oxyCODONE (OXY IR/ROXICODONE) 5 MG immediate release tablet Take 5 mg by mouth every 8 (eight) hours as needed for pain. 06/17/21   [provider]  propranolol (INDERAL) 10 MG tablet 5 mg as needed. High heart rate    [provider]  spironolactone (ALDACTONE) 25 MG tablet Take 25 mg by mouth daily.    [provider]  SUMAtriptan (TOSYMRA) 10 MG/ACT SOLN Place 10 mg into the nose every hour. Maximum 3 doses per day. Patient taking differently: Place 10 mg into the nose as needed (migraines). 09/21/18   Melvenia Beam, MD  topiramate (TOPAMAX) 100 MG tablet Take 100 mg by mouth daily. 06/30/21   [provider]      Allergies    Latex, Other, Sulfa antibiotics, and Sulfa antibiotics    Review of Systems   Review of Systems  Cardiovascular:  Positive for palpitations.  All other systems reviewed and are negative.   Physical Exam Updated Vital Signs BP 125/87   Pulse 88   Temp 98.8 F (37.1 C) (Oral)   Resp 16   Ht 5\' 3"  (1.6 m)   Wt 78 kg   SpO2 100%   BMI 30.46 kg/m  Physical Exam Vitals and nursing note reviewed.   31 year old female, resting comfortably and in no acute distress. Vital signs are significant for mildly elevated blood pressure. Oxygen saturation is 100%, which is normal. Head is normocephalic and atraumatic. PERRLA, EOMI. Oropharynx is clear. Neck is nontender and supple without adenopathy or JVD. Back is nontender and there is no CVA tenderness. Lungs are clear without rales, wheezes, or rhonchi. Chest is nontender. Heart has regular rate and rhythm without murmur. Abdomen is soft, flat, nontender. Extremities have no cyanosis or edema, full range of motion is present. Skin is warm and dry without rash. Neurologic: Mental status is normal, cranial nerves are intact, strength is 5/5 in all 4 extremities.  There are no sensory deficits.  ED Results / Procedures / Treatments   Labs (all labs ordered are listed, but only abnormal results are displayed) Labs Reviewed  BASIC METABOLIC PANEL - Abnormal; Notable for the following components:      Result Value   CO2 17 (*)    All other components within normal limits  CBC  TSH  MAGNESIUM  I-STAT BETA HCG BLOOD, ED ([facility], [facility], [facility] ONLY)  TROPONIN I (HIGH SENSITIVITY)  TROPONIN I (HIGH SENSITIVITY)     EKG EKG Interpretation  Date/Time:  Monday March 09 2022 17:03:45 EDT Ventricular Rate:  115 PR Interval:  144 QRS Duration: 76 QT Interval:  310 QTC Calculation: 428 R Axis:   102 Text Interpretation: Sinus tachycardia Rightward axis Cannot rule out Anterior infarct , age undetermined Abnormal ECG When compared with ECG of EARLIER SAME DATE No significant change was found Confirmed by Delora Fuel ([id number]) on 03/09/2022 11:21:04 PM  Radiology DG Chest 2 View  Result Date: 03/09/2022 CLINICAL DATA:  Palpitations and chest pain, history of POTS EXAM: CHEST - 2 VIEW COMPARISON:  Radiographs 05/09/2021 FINDINGS: No focal consolidation, pleural effusion, or pneumothorax. Normal cardiomediastinal silhouette. No change from 05/09/2021. IMPRESSION: Normal  chest radiographs. Electronically Signed   By: Minerva Fester M.D.   On: 03/09/2022 14:13    Procedures Procedures  Cardiac monitor shows normal sinus rhythm, per my interpretation.  Medications Ordered in ED Medications - No data to display  ED Course/ Medical Decision Making/ A&P                           Medical Decision Making Risk OTC drugs. Prescription drug management.   Episodes of palpitations.  Most episodes sound like they are probably sinus tachycardia.  What she had today does not sound like paroxysmal supraventricular tachycardia or paroxysmal atrial fibrillation or paroxysmal atrial flutter.  Old records reviewed, and she had an outpatient heart monitor last October/November and she did have a 22 beat run of ventricular tachycardia on 06/04/2021 with average heart rate of 155, 1 brief episode of atrial tachycardia, many episodes of mild sinus tachycardia.  Echocardiogram on 07/02/2021 was completely normal.  On 07/02/2021 she had a coronary CT angiogram which showed no calcifications, and normal coronary arteries with a right dominant system.  I have reviewed and interpreted her ECG, and my interpretation is sinus  tachycardia not significantly changed from prior ECG.  Her chest x-ray is normal.  I have independently viewed the images, and agree with radiologist's interpretation.  I have reviewed and interpreted her laboratory tests and she has a metabolic acidosis with CO2 of 17 and potassium which is technically normal at 3.6, but lower than I would like to see in a patient who has had a documented episode of ventricular tachycardia.  Magnesium is normal at 2.0.  Patient states that she is taking magnesium supplements at home.  Troponin is normal, but was obtained very close to the onset of her chest pain so will need to be repeated.  I have looked back at her prior labs and she frequently has a mild metabolic acidosis and potassium is frequently been in the lower end of normal.  Patient has been told that her metabolic acidosis is secondary to the topiramate which she has been taking for the last 10 years.  Topiramate is known to be associated with renal tubular acidosis which can certainly be associated with relative hypokalemia and hypomagnesemia.  I would like to see her potassium at 4.0 or higher.  She is given a dose of oral potassium.  I have recommended that she discuss with her neurologist and gynecologist whether there is an alternate medication that might be available that would not cause the metabolic acidosis and attendant troponin potassium level.  I am also giving her referral to nephrology.  I have discussed with her possibly getting one of the apps for her phone that can check her heart rhythm so she can tell if she has palpitations whether it is one of the serious causes of palpitations or not.  Repeat troponin is unchanged.  I have discharged her with a prescription for oral potassium.  Because she is on spironolactone, potassium level will need to be monitored closely.  I recommended follow-up with her neurologist, gynecologist, cardiologist, primary care provider.  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  Final Clinical Impression(s) / ED Diagnoses Final diagnoses:  Palpitations  Renal tubular acidosis    Rx / DC Orders ED Discharge Orders          Ordered    potassium chloride SA (KLOR-CON M) 20 MEQ tablet  Daily  03/10/22 0423              Dione Booze, MD 03/10/22 [phone]

## 2022-03-12 ENCOUNTER — Telehealth: Payer: Self-pay | Admitting: Cardiology

## 2022-03-12 NOTE — Telephone Encounter (Signed)
Reviewed this information with Dr Anne Fu.  Pt has previously been referred to Dr Steffanie Dunn (EP) for further evaluation.  Her appt is scheduled for 04/01/22 and he will determine any further testing needed during that visit   Attempted to contact phone number listed in this message to notify Dr Reita Cliche, voice mail stated a different name and therefore I did not leave a message.

## 2022-03-12 NOTE — Telephone Encounter (Signed)
Dr. Leo Rod called on behalf of pt, he is requesting a referral for a potts tilt test to [facility] [address] [place], [place], [place] [address] for pt to either Dr. Read Drivers or Dr. Gerre Pebbles. Please advise.

## 2022-03-17 ENCOUNTER — Ambulatory Visit (INDEPENDENT_AMBULATORY_CARE_PROVIDER_SITE_OTHER): Payer: 59 | Admitting: Neurology

## 2022-03-17 DIAGNOSIS — G43709 Chronic migraine without aura, not intractable, without status migrainosus: Secondary | ICD-10-CM | POA: Diagnosis not present

## 2022-03-17 MED ORDER — ONABOTULINUMTOXINA 200 UNITS IJ SOLR
155.0000 [IU] | Freq: Once | INTRAMUSCULAR | Status: AC
  Administered 2022-03-17: 155 [IU] via INTRAMUSCULAR

## 2022-03-17 NOTE — Progress Notes (Signed)
Consent Form Botulism Toxin Injection For Chronic Migraine 03/17/2022: stable, botox works great; still > 70% improvement in migraine frequency with botox. Taking 200mg  at bedtime topiramate, we have 100 listed at bedtime. She has acidoses, was recommended to decrease, would decrease 150mg  then could try 100mg  at bedtime. Don;t want to stop it, it helps with her pelvic pain and have tried stopping it int he past and migraines worsened so go down to 150mg  and then maybe 100 will see - being prescribed by another doc would just let them know when you see them.   12/23/2021: Stable as far as migraines. Referred to the [facility] scheduling [phone]. At [place]. Has an appointment in July  09/30/2021: Stable, still > 70% improvement in migraine frequency with botox.   Reviewed orally with patient, additionally signature is on file:  Botulism toxin has been approved by the Federal drug administration for treatment of chronic migraine. Botulism toxin does not cure chronic migraine and it may not be effective in some patients.  The administration of botulism toxin is accomplished by injecting a small amount of toxin into the muscles of the neck and head. Dosage must be titrated for each individual. Any benefits resulting from botulism toxin tend to wear off after 3 months with a repeat injection required if benefit is to be maintained. Injections are usually done every 3-4 months with maximum effect peak achieved by about 2 or 3 weeks. Botulism toxin is expensive and you should be sure of what costs you will incur resulting from the injection.  The side effects of botulism toxin use for chronic migraine may include:   -Transient, and usually mild, facial weakness with facial injections  -Transient, and usually mild, head or neck weakness with head/neck injections  -Reduction or loss of forehead facial animation due to forehead muscle weakness  -Eyelid drooping  -Dry  eye  -Pain at the site of injection or bruising at the site of injection  -Double vision  -Potential unknown long term risks  Contraindications: You should not have Botox if you are pregnant, nursing, allergic to albumin, have an infection, skin condition, or muscle weakness at the site of the injection, or have myasthenia gravis, Lambert-Eaton syndrome, or ALS.  It is also possible that as with any injection, there may be an allergic reaction or no effect from the medication. Reduced effectiveness after repeated injections is sometimes seen and rarely infection at the injection site may occur. All care will be taken to prevent these side effects. If therapy is given over a long time, atrophy and wasting in the muscle injected may occur. Occasionally the patient's become refractory to treatment because they develop antibodies to the toxin. In this event, therapy needs to be modified.  I have read the above information and consent to the administration of botulism toxin.    BOTOX PROCEDURE NOTE FOR MIGRAINE HEADACHE    Contraindications and precautions discussed with patient(above). Aseptic procedure was observed and patient tolerated procedure. Procedure performed by Dr. [facility]  The condition has existed for more than 6 months, and pt does not have a diagnosis of ALS, Myasthenia Gravis or Lambert-Eaton Syndrome.  Risks and benefits of injections discussed and pt agrees to proceed with the procedure.  Written consent obtained  These injections are medically necessary. Pt  receives good benefits from these injections. These injections do not cause sedations or hallucinations which the oral therapies may cause.  Description of procedure:  The patient was placed  in a sitting position. The standard protocol was used for Botox as follows, with 5 units of Botox injected at each site:   -Procerus muscle, midline injection  -Corrugator muscle, bilateral injection  -Frontalis muscle,  bilateral injection, with 2 sites each side, medial injection was performed in the upper one third of the frontalis muscle, in the region vertical from the medial inferior edge of the superior orbital rim. The lateral injection was again in the upper one third of the forehead vertically above the lateral limbus of the cornea, 1.5 cm lateral to the medial injection site.  -Temporalis muscle injection, 4 sites, bilaterally. The first injection was 3 cm above the tragus of the ear, second injection site was 1.5 cm to 3 cm up from the first injection site in line with the tragus of the ear. The third injection site was 1.5-3 cm forward between the first 2 injection sites. The fourth injection site was 1.5 cm posterior to the second injection site.   -Occipitalis muscle injection, 3 sites, bilaterally. The first injection was done one half way between the occipital protuberance and the tip of the mastoid process behind the ear. The second injection site was done lateral and superior to the first, 1 fingerbreadth from the first injection. The third injection site was 1 fingerbreadth superiorly and medially from the first injection site.  -Cervical paraspinal muscle injection, 2 sites, bilateral knee first injection site was 1 cm from the midline of the cervical spine, 3 cm inferior to the lower border of the occipital protuberance. The second injection site was 1.5 cm superiorly and laterally to the first injection site.  -Trapezius muscle injection was performed at 3 sites, bilaterally. The first injection site was in the upper trapezius muscle halfway between the inflection point of the neck, and the acromion. The second injection site was one half way between the acromion and the first injection site. The third injection was done between the first injection site and the inflection point of the neck.   Will return for repeat injection in 3 months.   155 unit sof Botox was used, 45u Botox not injected was  wasted. The patient tolerated the procedure well, there were no complications of the above procedure.

## 2022-03-17 NOTE — Progress Notes (Signed)
Botox- 200 units x 1 vial Lot: [id number]  Expiration: 09/2024 NDC: 7096-2836-62  Bacteriostatic 0.9% Sodium Chloride- 47mL total Lot: [id number]  Expiration: 03/18/2023 NDC: 9476-5465-03  Dx: T46.568  S/P

## 2022-03-21 ENCOUNTER — Other Ambulatory Visit: Payer: Self-pay | Admitting: Neurology

## 2022-04-01 ENCOUNTER — Institutional Professional Consult (permissible substitution): Payer: 59 | Admitting: Cardiology

## 2022-04-21 ENCOUNTER — Other Ambulatory Visit: Payer: Self-pay | Admitting: *Deleted

## 2022-04-21 MED ORDER — BOTOX 200 UNITS IJ SOLR: INTRAMUSCULAR | 1 refills | Status: DC

## 2022-04-28 ENCOUNTER — Telehealth: Payer: Self-pay | Admitting: *Deleted

## 2022-04-28 NOTE — Telephone Encounter (Signed)
We received a PA request form from [facility] for Botox followed by a denial letter. The letter says we can fax the questionnaire back to them. I faxed to [facility] the questionnaire, last Botox note, and copy of denial letter and requested continued approval of Botox. Received a receipt of confirmation.

## 2022-05-05 ENCOUNTER — Other Ambulatory Visit: Payer: Self-pay | Admitting: Internal Medicine

## 2022-05-05 ENCOUNTER — Telehealth: Payer: Self-pay | Admitting: Neurology

## 2022-05-05 ENCOUNTER — Ambulatory Visit
Admission: RE | Admit: 2022-05-05 | Discharge: 2022-05-05 | Disposition: A | Discharge status: Home / Self Care | Payer: 59 | Source: Ambulatory Visit | Attending: Internal Medicine | Admitting: Internal Medicine

## 2022-05-05 DIAGNOSIS — R221 Localized swelling, mass and lump, neck: Secondary | ICD-10-CM

## 2022-05-05 NOTE — Telephone Encounter (Signed)
Pt have had migraine for a week with level of pain a 7. Would like a call from the nurse to discuss getting an infusion for migraines.

## 2022-05-05 NOTE — Telephone Encounter (Signed)
Spoke with patient. She reports an intractable migraine x 1 week. She had continued to work because she has to but spent the weekend resting. Migraine unresponsive to Almotriptan, Imitrex, Tylenol, Advil (tries to avoid because it makes her stomach hurt), Zofran. She has brain fog and can't think straight. States past infusions have helped. Prefers not to take Compazine plus she doesn't have a driver. She also states steroids don't make her feel well. Pt confirmed allergies same.   Obtained order from Dr Jaynee Eagles for Depacon 1 gram IV x 1, Toradol 30 mg IV x 1, and Zofran 4 mg PO x 1. Order signed and given to [place] [facility]. Pt will come today at 3:30 PM. She was very appreciative.

## 2022-05-13 ENCOUNTER — Other Ambulatory Visit (HOSPITAL_COMMUNITY): Payer: Self-pay

## 2022-05-13 NOTE — Telephone Encounter (Signed)
Can prior auth team look into this? Past Botox approvals were with [employer] and now we have received a request from [facility] but they haven't responded to the PA we faxed back. There was no number for contact and her most recently uploaded insurance card is [employer]. Her next Botox appt is 06/09/2022 and it appears we have an active auth from [facility] through 09/01/2022.  Chronic Migraine CPT 64615  Botox J0585 Units:200  G43.709 Chronic Migraine without aura, not intractable, without status migrainous

## 2022-05-18 ENCOUNTER — Other Ambulatory Visit (HOSPITAL_COMMUNITY): Payer: Self-pay

## 2022-05-18 MED ORDER — BOTOX 200 UNITS IJ SOLR: INTRAMUSCULAR | 1 refills | Status: DC

## 2022-05-18 NOTE — Addendum Note (Signed)
Addended by: Gildardo Griffes on: 05/18/2022 10:21 AM   Modules accepted: Orders

## 2022-05-18 NOTE — Telephone Encounter (Signed)
Called [facility], rep advised they never received the PA criteria. Completed over the phone with rep.  Received notification from [facility] regarding a prior authorization for  Botox 200 unit . Authorization has been APPROVED from 04/18/22 to 05/18/23.   Patient must fill through [facility]: [phone]  Authorization # [id number] Phone # [phone]

## 2022-05-18 NOTE — Telephone Encounter (Signed)
Spoke with Candy @ [facility] to confirm Botox canceled. She stated they had not even received a prescription so there was nothing to cancel. Botox 200 unit prescription sent to [facility] in [place]. Will need to schedule delivery once prescription has been processed.

## 2022-05-25 ENCOUNTER — Other Ambulatory Visit (HOSPITAL_BASED_OUTPATIENT_CLINIC_OR_DEPARTMENT_OTHER): Payer: Self-pay | Admitting: Physical Medicine and Rehabilitation

## 2022-05-25 DIAGNOSIS — S060XAA Concussion with loss of consciousness status unknown, initial encounter: Secondary | ICD-10-CM

## 2022-05-27 NOTE — Telephone Encounter (Signed)
I called [facility]. I spoke with Raol. I scheduled botox delivery for 10/17.

## 2022-06-01 ENCOUNTER — Ambulatory Visit: Payer: 59 | Attending: Audiologist | Admitting: Audiologist

## 2022-06-01 DIAGNOSIS — H903 Sensorineural hearing loss, bilateral: Secondary | ICD-10-CM | POA: Diagnosis present

## 2022-06-01 NOTE — Procedures (Signed)
  Outpatient Audiology and [place], [place]  [address] [phone]  AUDIOLOGICAL  EVALUATION  NAME: Destiny Ellis     DOB:   11-14-1990      MRN: [id number]                                                                                     DATE: 06/01/2022     REFERENT: Willey Blade, MD STATUS: Outpatient DIAGNOSIS: Sensorineural Hearing Loss Bilaterally, Otalgia Right Ear    History: [name], 31 y.o., was seen for an audiological evaluation. Theone is receiving a hearing evaluation due to muffled hearing and sharp pain in the right ear. [name] has difficulty hearing during zoom meetings with international coworkers. [name] is having to hold to computer audio to her left ear to hear. This difficulty began suddenly in July and has been rapidly getting worse. 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 [name] has a no family history of early onset hearing loss. No history of excessive noise exposure. [name] has history of migraines and is scheduled for an MRI this Friday. [name] also has history of several concussions. Her last concussion was five years ago. [name] is having occasional dizziness. She has some jaw tension but does not grind or clench her jaw. No other relevant case history reported.   Evaluation:  Otoscopy showed a clear view of the tympanic membranes, bilaterally Tympanometry results were consistent with normal middle ear function, bilaterally with slight hypercompliance in the right ear  Audiometric testing was completed using conventional audiometry with insert transducer. Speech Recognition Thresholds were  40dB in the right ear and 35dB in the left ear. Word Recognition was performed 40dB SL, scored 96% in the right ear and 100% in the left ear. Pure tone thresholds show mild sensorineural hearing loss in the right ear and normal sloping to mild sensorineural hearing loss in the left ear.  Distortion Product Otoacoustic emissions were as  follows: absent in the right ear 1.5-12kHz, present in left ear 4-11kHz and absent 1.5-2 and 12kHz    Results:  The test results were reviewed with Catheleen. [name] has a mild sensorineural hearing loss in each ear with the right ear worse. Since this onset recently and Shantoria reports rapid progression, recommend referral to Otolaryngologist for medical evaluation.  Follow up with audiology scheduled in one month to continue monitoring [name].  Recommendations: Referral to ENT Physician necessary due to sudden onset hearing loss with progression and pain in right ear. Willey Blade, MD please sent a referral to [facility] or Dr. Benjamine Mola for evaluation. Otolaryngology referral also recommended by Dr. Joice Lofts due to reports of dizziness. Audiologic monitoring necessary, next evaluation scheduled for 07/02/2022.     43 minutes spent testing and counseling on results.   Alfonse Alpers  Audiologist, Au.D., CCC-A 06/01/2022  4:39 PM  Cc: Willey Blade, MD

## 2022-06-05 ENCOUNTER — Ambulatory Visit (HOSPITAL_BASED_OUTPATIENT_CLINIC_OR_DEPARTMENT_OTHER)
Admission: RE | Admit: 2022-06-05 | Discharge: 2022-06-05 | Disposition: A | Discharge status: Home / Self Care | Payer: 59 | Source: Ambulatory Visit | Attending: Physical Medicine and Rehabilitation | Admitting: Physical Medicine and Rehabilitation

## 2022-06-05 DIAGNOSIS — Y929 Unspecified place or not applicable: Secondary | ICD-10-CM | POA: Insufficient documentation

## 2022-06-05 DIAGNOSIS — Y9389 Activity, other specified: Secondary | ICD-10-CM | POA: Diagnosis not present

## 2022-06-05 DIAGNOSIS — S060XAA Concussion with loss of consciousness status unknown, initial encounter: Secondary | ICD-10-CM

## 2022-06-05 DIAGNOSIS — R519 Headache, unspecified: Secondary | ICD-10-CM | POA: Insufficient documentation

## 2022-06-09 ENCOUNTER — Institutional Professional Consult (permissible substitution): Payer: 59 | Admitting: Cardiology

## 2022-06-09 ENCOUNTER — Ambulatory Visit: Payer: 59 | Admitting: Neurology

## 2022-06-23 ENCOUNTER — Ambulatory Visit (INDEPENDENT_AMBULATORY_CARE_PROVIDER_SITE_OTHER): Payer: 59 | Admitting: Neurology

## 2022-06-23 DIAGNOSIS — G43709 Chronic migraine without aura, not intractable, without status migrainosus: Secondary | ICD-10-CM

## 2022-06-23 MED ORDER — METHYLPREDNISOLONE 4 MG PO TBPK: ORAL_TABLET | ORAL | 1 refills | Status: DC

## 2022-06-23 MED ORDER — ONABOTULINUMTOXINA 200 UNITS IJ SOLR
155.0000 [IU] | Freq: Once | INTRAMUSCULAR | Status: AC
  Administered 2022-06-23: 155 [IU] via INTRAMUSCULAR

## 2022-06-23 NOTE — Progress Notes (Signed)
At the end of the visit patient mentioned that she has been losing hearing in her ears, R worse than L and this has been progressive since her last Botox visit 3 months ago. Patient stated her other neurologist she sees for concussion ordered an MRI. She states nothing is wrong with her brain so that neurologist recommended she see an audiologist and ENT. She has been scheduled with audiology. I advised she follow-up with that neurologist about referral to ENT. Pt wanted Dr Jaynee Eagles to know and I relayed the message for her.

## 2022-06-23 NOTE — Progress Notes (Signed)
Consent Form Botulism Toxin Injection For Chronic Migraine 06/23/2022: stable doing well if she needs supraorbital nerve block on the right we can do it  03/17/2022: stable, botox works great; still > 70% improvement in migraine frequency with botox. Taking 200mg  at bedtime topiramate, we have 100 listed at bedtime. She has acidoses, was recommended to decrease, would decrease 150mg  then could try 100mg  at bedtime. Don;t want to stop it, it helps with her pelvic pain and have tried stopping it int he past and migraines worsened so go down to 150mg  and then maybe 100 will see - being prescribed by another doc would just let them know when you see them.   12/23/2021: Stable as far as migraines. Referred to the [facility] scheduling [phone]. At [place]. Has an appointment in July  09/30/2021: Stable, still > 70% improvement in migraine frequency with botox.   Reviewed orally with patient, additionally signature is on file:  Botulism toxin has been approved by the Federal drug administration for treatment of chronic migraine. Botulism toxin does not cure chronic migraine and it may not be effective in some patients.  The administration of botulism toxin is accomplished by injecting a small amount of toxin into the muscles of the neck and head. Dosage must be titrated for each individual. Any benefits resulting from botulism toxin tend to wear off after 3 months with a repeat injection required if benefit is to be maintained. Injections are usually done every 3-4 months with maximum effect peak achieved by about 2 or 3 weeks. Botulism toxin is expensive and you should be sure of what costs you will incur resulting from the injection.  The side effects of botulism toxin use for chronic migraine may include:   -Transient, and usually mild, facial weakness with facial injections  -Transient, and usually mild, head or neck weakness with head/neck injections  -Reduction  or loss of forehead facial animation due to forehead muscle weakness  -Eyelid drooping  -Dry eye  -Pain at the site of injection or bruising at the site of injection  -Double vision  -Potential unknown long term risks  Contraindications: You should not have Botox if you are pregnant, nursing, allergic to albumin, have an infection, skin condition, or muscle weakness at the site of the injection, or have myasthenia gravis, Lambert-Eaton syndrome, or ALS.  It is also possible that as with any injection, there may be an allergic reaction or no effect from the medication. Reduced effectiveness after repeated injections is sometimes seen and rarely infection at the injection site may occur. All care will be taken to prevent these side effects. If therapy is given over a long time, atrophy and wasting in the muscle injected may occur. Occasionally the patient's become refractory to treatment because they develop antibodies to the toxin. In this event, therapy needs to be modified.  I have read the above information and consent to the administration of botulism toxin.    BOTOX PROCEDURE NOTE FOR MIGRAINE HEADACHE    Contraindications and precautions discussed with patient(above). Aseptic procedure was observed and patient tolerated procedure. Procedure performed by Dr. [facility]  The condition has existed for more than 6 months, and pt does not have a diagnosis of ALS, Myasthenia Gravis or Lambert-Eaton Syndrome.  Risks and benefits of injections discussed and pt agrees to proceed with the procedure.  Written consent obtained  These injections are medically necessary. Pt  receives good benefits from these injections. These injections do not cause  sedations or hallucinations which the oral therapies may cause.  Description of procedure:  The patient was placed in a sitting position. The standard protocol was used for Botox as follows, with 5 units of Botox injected at each site:   -Procerus  muscle, midline injection  -Corrugator muscle, bilateral injection  -Frontalis muscle, bilateral injection, with 2 sites each side, medial injection was performed in the upper one third of the frontalis muscle, in the region vertical from the medial inferior edge of the superior orbital rim. The lateral injection was again in the upper one third of the forehead vertically above the lateral limbus of the cornea, 1.5 cm lateral to the medial injection site.  -Temporalis muscle injection, 4 sites, bilaterally. The first injection was 3 cm above the tragus of the ear, second injection site was 1.5 cm to 3 cm up from the first injection site in line with the tragus of the ear. The third injection site was 1.5-3 cm forward between the first 2 injection sites. The fourth injection site was 1.5 cm posterior to the second injection site.   -Occipitalis muscle injection, 3 sites, bilaterally. The first injection was done one half way between the occipital protuberance and the tip of the mastoid process behind the ear. The second injection site was done lateral and superior to the first, 1 fingerbreadth from the first injection. The third injection site was 1 fingerbreadth superiorly and medially from the first injection site.  -Cervical paraspinal muscle injection, 2 sites, bilateral knee first injection site was 1 cm from the midline of the cervical spine, 3 cm inferior to the lower border of the occipital protuberance. The second injection site was 1.5 cm superiorly and laterally to the first injection site.  -Trapezius muscle injection was performed at 3 sites, bilaterally. The first injection site was in the upper trapezius muscle halfway between the inflection point of the neck, and the acromion. The second injection site was one half way between the acromion and the first injection site. The third injection was done between the first injection site and the inflection point of the neck.   Will return for  repeat injection in 3 months.   155 unit sof Botox was used, 45u Botox not injected was wasted. The patient tolerated the procedure well, there were no complications of the above procedure.

## 2022-06-23 NOTE — Progress Notes (Signed)
Botox- 200 units x 1 vial Lot: [id number] Expiration: 10/2024 NDC: 0023-3921-02  Bacteriostatic 0.9% Sodium Chloride- 4mL total Lot: [id number] Expiration: 04/18/2023 NDC: 0409-1966-02  Dx: G43.709 S/P  

## 2022-07-02 ENCOUNTER — Ambulatory Visit: Payer: 59 | Admitting: Audiologist

## 2022-07-13 ENCOUNTER — Ambulatory Visit: Payer: 59 | Attending: Audiologist | Admitting: Audiologist

## 2022-07-13 DIAGNOSIS — H903 Sensorineural hearing loss, bilateral: Secondary | ICD-10-CM | POA: Diagnosis not present

## 2022-07-13 DIAGNOSIS — F0781 Postconcussional syndrome: Secondary | ICD-10-CM | POA: Insufficient documentation

## 2022-07-14 NOTE — Procedures (Signed)
  Outpatient Audiology and [facility] [address] [place], [place]  [address] [phone]  AUDIOLOGICAL  EVALUATION  NAME: Tangia Pinard     DOB:   [birth year]      MRN: [id number]                                                                                     DATE: 07/14/2022     REFERENT: Andi Devon, MD STATUS: Outpatient DIAGNOSIS: Mild Sensorineural Hearing Loss, Post Concussive Syndrome     History: Kyran was seen for an audiological evaluation to monitor hearing loss progression. Jadine was first seen 06/01/2022. Audiometric testing showed a mild bilateral hearing loss and absent DPOAEs. Since her last evaluation Kaniah feels her hearing has decreased. Jaleena now cannot hear the TV if other people are talking. She can hear when people talk just not understand.   Myriah has difficulty hearing during zoom meetings with international coworkers. Maryln is having to hold to computer audio to her left ear to hear. This difficulty began suddenly in July and has been rapidly getting worse. 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 Bertina has a no family history of early onset hearing loss. No history of excessive noise exposure. Sukaina has history of migraines and is scheduled for an MRI this Friday. Shekera also has history of several concussions. Her last concussion was five years ago. Patric is having occasional dizziness. She has some jaw tension but does not grind or clench her jaw. No other relevant case history reported.   Evaluation:  Otoscopy showed a clear view of the tympanic membranes, bilaterally Tympanometry results were consistent with normal middle ear function, bilaterally   Audiometric testing was completed using conventional audiometry with insert transducer. Speech Recognition Thresholds were 40dB in the right ear and 30dB in the left ear. Word Recognition was  performed 40dB SL, scored 100% in the right ear and 100% in the left ear. QuickSIN normal  for bilateral presentation. Pure tone thresholds show mild sensorineural hearing loss in the right ear and normal sloping to mild sensorineural hearing loss in the left ear. Hearing stable.   Results:  The test results were reviewed with Elverna. Her hearing is stable. She has not had a significant change in hearing thresholds. This means she would be a good candidate for hearing aids to aid in speech understanding. She can use OTC hearing aids or professionally fit hearing aids. She was given a list of recommended OTC hearing products. She was also encouraged to follow up with the referral to Otolaryngology and given the number to schedule. Referral was placed. Jenayah given print outs of both audiograms.   Recommendations: Over the counter amplification is recommended for both ears. Hearing aids can be purchased from a variety of locations. See provided list for locations in the [place] area and recommended OTC options.  Follow up with ENT Physician for medical clearance.  Follow up with audiometric testing again in 6 months or sooner if new symptoms arise.    47 minutes spent testing and counseling on results.   Ammie Ferrier  Audiologist, Au.D., CCC-A 07/14/2022  8:32 AM  Cc: Andi Devon, MD

## 2022-07-15 ENCOUNTER — Encounter: Payer: Self-pay | Admitting: Neurology

## 2022-07-16 ENCOUNTER — Other Ambulatory Visit: Payer: Self-pay | Admitting: Neurology

## 2022-07-16 MED ORDER — INDOMETHACIN 50 MG PO CAPS: 50.0000 mg | ORAL_CAPSULE | Freq: Three times a day (TID) | ORAL | 3 refills | Status: DC

## 2022-07-21 ENCOUNTER — Telehealth: Payer: Self-pay | Admitting: Neurology

## 2022-07-21 NOTE — Telephone Encounter (Signed)
Pt stated that her pharmacy is now requiring a PA for ajovy and stated that her pharmacy is needing information from [place] before ajovy can be filled

## 2022-07-22 NOTE — Telephone Encounter (Signed)
Working on PA

## 2022-07-22 NOTE — Telephone Encounter (Signed)
Completed Ajovy PA on CMM. Key: [id number]. Approved immediately by [employer]. [id number];TIWPYK:[id number];Review Type:Prior Auth;Coverage Start Date:06/22/2022;Coverage End Date:07/22/2023.

## 2022-07-27 ENCOUNTER — Ambulatory Visit: Payer: 59 | Attending: Cardiology | Admitting: Cardiology
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
## 2022-08-24 NOTE — Telephone Encounter (Signed)
Spoke with Jenny Reichmann [phone]) at [place]. I scheduled Botox delivery for 08/27/22. Appt is on 09/16/22.

## 2022-08-29 ENCOUNTER — Other Ambulatory Visit: Payer: Self-pay

## 2022-08-29 ENCOUNTER — Emergency Department (HOSPITAL_BASED_OUTPATIENT_CLINIC_OR_DEPARTMENT_OTHER): Payer: 59

## 2022-08-29 ENCOUNTER — Emergency Department (HOSPITAL_BASED_OUTPATIENT_CLINIC_OR_DEPARTMENT_OTHER)
Admission: EM | Admit: 2022-08-29 | Discharge: 2022-08-29 | Disposition: A | Discharge status: Home / Self Care | Payer: 59 | Attending: Emergency Medicine | Admitting: Emergency Medicine

## 2022-08-29 ENCOUNTER — Encounter (HOSPITAL_BASED_OUTPATIENT_CLINIC_OR_DEPARTMENT_OTHER): Payer: Self-pay | Admitting: Emergency Medicine

## 2022-08-29 DIAGNOSIS — R1031 Right lower quadrant pain: Secondary | ICD-10-CM | POA: Diagnosis not present

## 2022-08-29 DIAGNOSIS — Z91040 Latex allergy status: Secondary | ICD-10-CM | POA: Diagnosis not present

## 2022-08-29 DIAGNOSIS — R110 Nausea: Secondary | ICD-10-CM | POA: Insufficient documentation

## 2022-08-29 LAB — URINALYSIS, ROUTINE W REFLEX MICROSCOPIC
Bilirubin Urine: NEGATIVE
Glucose, UA: NEGATIVE mg/dL
Hgb urine dipstick: NEGATIVE
Ketones, ur: NEGATIVE mg/dL
Leukocytes,Ua: NEGATIVE
Nitrite: NEGATIVE
Protein, ur: NEGATIVE mg/dL
Specific Gravity, Urine: <1.005 — ABNORMAL LOW (ref 1.005–1.030)
pH: 6.5 (ref 5.0–8.0)

## 2022-08-29 LAB — COMPREHENSIVE METABOLIC PANEL
ALT: 16 U/L (ref 0–44)
AST: 15 U/L (ref 15–41)
Albumin: 4.9 g/dL (ref 3.5–5.0)
Alkaline Phosphatase: 59 U/L (ref 38–126)
Anion gap: 11 (ref 5–15)
BUN: 10 mg/dL (ref 6–20)
CO2: 23 mmol/L (ref 22–32)
Calcium: 9.8 mg/dL (ref 8.9–10.3)
Chloride: 104 mmol/L (ref 98–111)
Creatinine, Ser: 0.93 mg/dL (ref 0.44–1.00)
GFR, Estimated: >60 mL/min (ref >60)
Glucose, Bld: 86 mg/dL (ref 70–99)
Potassium: 3.7 mmol/L (ref 3.5–5.1)
Sodium: 138 mmol/L (ref 135–145)
Total Bilirubin: 0.6 mg/dL (ref 0.3–1.2)
Total Protein: 7.9 g/dL (ref 6.5–8.1)

## 2022-08-29 LAB — CBC
HCT: 43.9 % (ref 36.0–46.0)
Hemoglobin: 14.3 g/dL (ref 12.0–15.0)
MCH: 31.1 pg (ref 26.0–34.0)
MCHC: 32.6 g/dL (ref 30.0–36.0)
MCV: 95.4 fL (ref 80.0–100.0)
Platelets: 356 10*3/uL (ref 150–400)
RBC: 4.6 MIL/uL (ref 3.87–5.11)
RDW: 12.4 % (ref 11.5–15.5)
WBC: 9.3 10*3/uL (ref 4.0–10.5)
nRBC: 0 % (ref 0.0–0.2)

## 2022-08-29 LAB — PREGNANCY, URINE: Preg Test, Ur: NEGATIVE

## 2022-08-29 LAB — LIPASE, BLOOD: Lipase: 25 U/L (ref 11–51)

## 2022-08-29 MED ORDER — ONDANSETRON HCL 4 MG/2ML IJ SOLN
4.0000 mg | Freq: Once | INTRAMUSCULAR | Status: AC
  Administered 2022-08-29: 4 mg via INTRAVENOUS
  Filled 2022-08-29: qty 2

## 2022-08-29 MED ORDER — MORPHINE SULFATE (PF) 4 MG/ML IV SOLN
4.0000 mg | Freq: Once | INTRAVENOUS | Status: AC
  Administered 2022-08-29: 4 mg via INTRAVENOUS
  Filled 2022-08-29: qty 1

## 2022-08-29 MED ORDER — IOHEXOL 300 MG/ML  SOLN
80.0000 mL | Freq: Once | INTRAMUSCULAR | Status: AC | PRN
  Administered 2022-08-29: 80 mL via INTRAVENOUS

## 2022-08-29 MED ORDER — MELOXICAM 7.5 MG PO TABS: 7.5000 mg | ORAL_TABLET | Freq: Every day | ORAL | 0 refills | Status: DC

## 2022-08-29 MED ORDER — SODIUM CHLORIDE 0.9 % IV BOLUS
1000.0000 mL | Freq: Once | INTRAVENOUS | Status: AC
  Administered 2022-08-29: 1000 mL via INTRAVENOUS

## 2022-08-29 MED ORDER — ONDANSETRON HCL 4 MG PO TABS: 4.0000 mg | ORAL_TABLET | Freq: Four times a day (QID) | ORAL | 0 refills | Status: DC

## 2022-08-29 MED ORDER — KETOROLAC TROMETHAMINE 15 MG/ML IJ SOLN
15.0000 mg | Freq: Once | INTRAMUSCULAR | Status: AC
  Administered 2022-08-29: 15 mg via INTRAVENOUS
  Filled 2022-08-29: qty 1

## 2022-08-29 NOTE — ED Provider Notes (Signed)
[facility] EMERGENCY DEPT Provider Note   CSN: [id number] Arrival date & time: 08/29/22  1217     History  Chief Complaint  Patient presents with   Abdominal Pain    Destiny Ellis is a 32 y.o. female.  She reports history of ovarian cysts and endometriosis.  She presents to the emergency department today for right lower quadrant abdominal pain.  She has been going on for the past 2 days today it is worse when she moves around.  She states initially she thought it was maybe an ovarian cyst or endometriosis but she states the pain is slightly higher than her normal cyst pain and normally it would go away on its own.  She denies any urinary symptoms, no fevers or chills, she admits to nausea but no vomiting.     Abdominal Pain      Home Medications Prior to Admission medications   Medication Sig Start Date End Date Taking? Authorizing Provider  indomethacin (INDOCIN) 50 MG capsule Take 1 capsule (50 mg total) by mouth 3 (three) times daily with meals. 07/16/22   Anson Fret, MD  AJOVY 225 MG/1.5ML SOAJ INJECT 225 MG INTO THE SKIN EVERY 30 (THIRTY) DAYS. 12/31/21   Anson Fret, MD  albuterol (VENTOLIN HFA) 108 (90 Base) MCG/ACT inhaler Inhale 2 puffs into the lungs as needed for shortness of breath. 12/21/21   [provider]  almotriptan (AXERT) 12.5 MG tablet TAKE 1 TABLET BY MOUTH AS NEEDED FOR MIGRAINE. MAY REPEAT IN 2 HOURS IF NEEDED 03/23/22   Anson Fret, MD  botulinum toxin Type A (BOTOX) 200 units injection Provider to inject 155 units into the muscles of the head and neck every 12 weeks. Discard remainder. 05/18/22   Anson Fret, MD  buPROPion (WELLBUTRIN XL) 150 MG 24 hr tablet TAKE 1 TABLET BY MOUTH EVERY DAY IN THE MORNING 08/25/21   Anson Fret, MD  butalbital-acetaminophen-caffeine (FIORICET, ESGIC) [phone] MG tablet Take 1-2 tabs every 6 hours as needed for headache 03/02/18   Anson Fret, MD  cetirizine (ZYRTEC) 10 MG tablet  Take 10 mg by mouth daily.    [provider]  clonazePAM (KLONOPIN) 0.5 MG tablet Take 0.5 mg by mouth daily. 12/21/21   [provider]  cyclobenzaprine (FLEXERIL) 10 MG tablet Take 10 mg by mouth 3 (three) times daily as needed. 06/27/21   [provider]  diltiazem (CARDIZEM) 30 MG tablet Take 1 tablet (30 mg total) by mouth as needed (for SVT). 01/28/22   Dyann Kief, PA-C  gabapentin (NEURONTIN) 100 MG capsule TAKE 1 CAPSULE BY MOUTH THREE TIMES A DAY. 06/19/20   Anson Fret, MD  JUNEL 1/20 1-20 MG-MCG tablet TAKE 1 TABLET BY MOUTH EVERY DAY 11/07/19   Anson Fret, MD  methylPREDNISolone (MEDROL DOSEPAK) 4 MG TBPK tablet Take pills daily all together with food. Take the first dose (6 pills) as soon as possible. Take the rest each morning. For 6 days total 6-5-4-3-2-1. 06/23/22   Anson Fret, MD  montelukast (SINGULAIR) 10 MG tablet daily. 07/28/16   [provider]  norethindrone (AYGESTIN) 5 MG tablet Take 2.5-5 mg by mouth daily. 07/09/21   [provider]  ondansetron (ZOFRAN) 8 MG tablet TAKE 1 TABLET BY MOUTH EVERY 8 HOURS AS NEEDED FOR NAUSEA AND VOMITING 12/03/21   Anson Fret, MD  oxyCODONE (OXY IR/ROXICODONE) 5 MG immediate release tablet Take 5 mg by mouth every 8 (eight) hours  as needed for pain. 06/17/21   [provider]  potassium chloride SA (KLOR-CON M) 20 MEQ tablet Take 1 tablet (20 mEq total) by mouth daily. 03/10/22   Dione Booze, MD  spironolactone (ALDACTONE) 25 MG tablet Take 25 mg by mouth daily.    [provider]  SUMAtriptan (TOSYMRA) 10 MG/ACT SOLN Place 10 mg into the nose every hour. Maximum 3 doses per day. Patient taking differently: Place 10 mg into the nose as needed (migraines). 09/21/18   Anson Fret, MD  topiramate (TOPAMAX) 100 MG tablet Take 100 mg by mouth daily. 06/30/21   [provider]      Allergies    Latex, Other, Sulfa antibiotics, and Sulfa antibiotics     Review of Systems   Review of Systems  Gastrointestinal:  Positive for abdominal pain.    Physical Exam Updated Vital Signs BP 126/88   Pulse 86   Temp 97.7 F (36.5 C)   Resp 16   Ht 5\' 3"  (1.6 m)   Wt 77.1 kg   LMP 07/06/2022 (Approximate) Comment: continuous birth control  SpO2 100%   BMI 30.11 kg/m  Physical Exam Vitals and nursing note reviewed.  Constitutional:      General: She is not in acute distress.    Appearance: She is well-developed.  HENT:     Head: Normocephalic and atraumatic.  Eyes:     Conjunctiva/sclera: Conjunctivae normal.  Cardiovascular:     Rate and Rhythm: Normal rate and regular rhythm.     Heart sounds: No murmur heard. Pulmonary:     Effort: Pulmonary effort is normal. No respiratory distress.     Breath sounds: Normal breath sounds.  Abdominal:     Palpations: Abdomen is soft.     Tenderness: There is no abdominal tenderness.  Musculoskeletal:        General: No swelling.     Cervical back: Neck supple.  Skin:    General: Skin is warm and dry.     Capillary Refill: Capillary refill takes less than 2 seconds.  Neurological:     Mental Status: She is alert.  Psychiatric:        Mood and Affect: Mood normal.     ED Results / Procedures / Treatments   Labs (all labs ordered are listed, but only abnormal results are displayed) Labs Reviewed  URINALYSIS, ROUTINE W REFLEX MICROSCOPIC - Abnormal; Notable for the following components:      Result Value   Color, Urine COLORLESS (*)    Specific Gravity, Urine <1.005 (*)    All other components within normal limits  LIPASE, BLOOD  COMPREHENSIVE METABOLIC PANEL  CBC  PREGNANCY, URINE    EKG None  Radiology CT ABDOMEN PELVIS W CONTRAST  Result Date: 08/29/2022 CLINICAL DATA:  Right lower quadrant abdominal pain EXAM: CT ABDOMEN AND PELVIS WITH CONTRAST TECHNIQUE: Multidetector CT imaging of the abdomen and pelvis was performed using the standard protocol following bolus  administration of intravenous contrast. RADIATION DOSE REDUCTION: This exam was performed according to the departmental dose-optimization program which includes automated exposure control, adjustment of the mA and/or kV according to patient size and/or use of iterative reconstruction technique. CONTRAST:  80mL OMNIPAQUE IOHEXOL 300 MG/ML  SOLN COMPARISON:  CT abdomen pelvis 12/15/2021 FINDINGS: Lower chest: No acute abnormality. Hepatobiliary: No focal liver abnormality is seen. No gallstones, gallbladder wall thickening, or biliary dilatation. Pancreas: Unremarkable. No pancreatic ductal dilatation or surrounding inflammatory changes. Spleen: Normal in size without focal abnormality.  Adrenals/Urinary Tract: Adrenal glands are unremarkable. Kidneys are normal, without renal calculi, focal lesion, or hydronephrosis. Redemonstrated prominent right renal pelvis. Bladder is unremarkable. Stomach/Bowel: Stomach is within normal limits. Appendix appears normal. No evidence of bowel wall thickening, distention, or inflammatory changes. Vascular/Lymphatic: No significant vascular findings are present. No enlarged abdominal or pelvic lymph nodes. Reproductive: Uterus and bilateral adnexa are unremarkable. Other: No abdominal wall hernia or abnormality. No abdominopelvic ascites. Musculoskeletal: No acute or significant osseous findings. IMPRESSION: No acute intra-abdominal pathology. No CT finding to explain the patient's right lower quadrant pain. She Electronically Signed   By: Emmaline Kluver M.D.   On: 08/29/2022 17:07    Procedures Procedures    Medications Ordered in ED Medications  morphine (PF) 4 MG/ML injection 4 mg (4 mg Intravenous Given 08/29/22 1540)  ondansetron (ZOFRAN) injection 4 mg (4 mg Intravenous Given 08/29/22 1541)  sodium chloride 0.9 % bolus 1,000 mL (1,000 mLs Intravenous New Bag/Given 08/29/22 1538)  iohexol (OMNIPAQUE) 300 MG/ML solution 80 mL (80 mLs Intravenous Contrast Given 08/29/22  1644)  morphine (PF) 4 MG/ML injection 4 mg (4 mg Intravenous Given 08/29/22 1701)    ED Course/ Medical Decision Making/ A&P Clinical Course as of 08/29/22 1835  Sat Aug 29, 2022  1753 Stable 31 YOF with abdominal pain. RLQ pain improving. CTAP OK. Pain improving. Discussed Torsion tho seems unlikely given no cysts. 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 [CC]    Clinical Course User Index [CC] Glyn Ade, MD                             Medical Decision Making Differential diagnosis: Appendicitis, cholecystitis, ovarian torsion, ovarian cyst, endometriosis, inflammatory bowel disease, other ED course: Patient having quadrant pain that is in different location than her usual ovarian cyst pain in different location and intensity than her usual endometriosis pain.  She denies STI concerns, no vaginal complaints.  No urinary symptoms.  Her labs are very reassuring.  CT ordered because patient did have positive Rovsing sign and tenderness over McBurney's point though her abdomen is soft with no rebound guarding or rigidity.  Given her continued pain after multiple doses of medication ultrasound ordered to rule out torsion.  Discussed the findings to this point with the patient and discussed our diagnostic uncertainty.  Plan will be if ultrasound is normal to follow-up closely with PCP and OB/GYN and she was given strict return precautions.  Signed out pending ultrasound to oncoming team.  Amount and/or Complexity of Data Reviewed Labs: ordered. Radiology: ordered.  Risk Prescription drug management.           Final Clinical Impression(s) / ED Diagnoses Final diagnoses:  None    Rx / DC Orders ED Discharge Orders     None         Josem Kaufmann 08/29/22 1839    Glyn Ade, MD 08/30/22 1528

## 2022-08-29 NOTE — ED Triage Notes (Signed)
Pt via pov from home with RLQ pain x 2 days. Pt has hx of ovarian cysts, which she says normally resolve. Pt reports pain is worse today. Pt alert & oriented, nad noted.

## 2022-08-29 NOTE — ED Notes (Signed)
Reviewed AVS/discharge instruction with patient. Time allotted for and all questions answered. Patient is agreeable for d/c and escorted to ed exit by staff.  

## 2022-08-29 NOTE — ED Notes (Signed)
Patient given ice chips per request.

## 2022-08-29 NOTE — ED Notes (Signed)
Patient transported to CT 

## 2022-08-29 NOTE — ED Provider Notes (Signed)
Pt's care assumed at 7pm  Ultrasound pending.   Ultrasound shows no acute abnormality Pt reports pain has improved .    Fransico Meadow, [place] 08/29/22 1948    Tretha Sciara, MD 08/30/22 1529

## 2022-08-29 NOTE — Discharge Instructions (Addendum)
Call your Physician to be seen for recheck next week

## 2022-09-15 ENCOUNTER — Encounter: Payer: Self-pay | Admitting: Neurology

## 2022-09-15 ENCOUNTER — Ambulatory Visit: Payer: 59 | Admitting: Neurology

## 2022-09-16 ENCOUNTER — Ambulatory Visit: Payer: 59 | Admitting: Neurology

## 2022-09-16 ENCOUNTER — Ambulatory Visit (INDEPENDENT_AMBULATORY_CARE_PROVIDER_SITE_OTHER): Payer: 59 | Admitting: Neurology

## 2022-09-16 DIAGNOSIS — G43709 Chronic migraine without aura, not intractable, without status migrainosus: Secondary | ICD-10-CM | POA: Diagnosis not present

## 2022-09-16 MED ORDER — UBRELVY 100 MG PO TABS: 100.0000 mg | ORAL_TABLET | ORAL | 0 refills | Status: DC | PRN

## 2022-09-16 MED ORDER — ONABOTULINUMTOXINA 200 UNITS IJ SOLR
155.0000 [IU] | Freq: Once | INTRAMUSCULAR | Status: AC
  Administered 2022-09-16: 155 [IU] via INTRAMUSCULAR

## 2022-09-16 NOTE — Progress Notes (Signed)
Botox- 200 units x 1 vial Lot: [id number] Expiration: 12/2024 NDC: 0023-3921-02  Bacteriostatic 0.9% Sodium Chloride- 4mL total Lot: [id number] Expiration: 11/25 NDC: 63323-924-03  Dx:G43.709 S/P 

## 2022-09-16 NOTE — Patient Instructions (Signed)
Please take one tablet at the onset of your headache. If it does not improve the symptoms please take one additional tablet in 2 hours.   Ubrogepant Tablets What is this medication? UBROGEPANT (ue BROE je pant) treats migraines. It works by blocking a substance in the body that causes migraines. It is not used to prevent migraines. This medicine may be used for other purposes; ask your health care provider or pharmacist if you have questions. COMMON BRAND NAME(S): Roselyn Meier What should I tell my care team before I take this medication? They need to know if you have any of these conditions: Kidney disease Liver disease An unusual or allergic reaction to ubrogepant, other medications, foods, dyes, or preservatives Pregnant or trying to get pregnant Breast-feeding How should I use this medication? Take this medication by mouth with a glass of water. Take it as directed on the prescription label. You can take it with or without food. If it upsets your stomach, take it with food. Keep taking it unless your care team tells you to stop. Talk to your care team about the use of this medication in children. Special care may be needed. Overdosage: If you think you have taken too much of this medicine contact a poison control center or emergency room at once. NOTE: This medicine is only for you. Do not share this medicine with others. What if I miss a dose? This does not apply. This medication is not for regular use. What may interact with this medication? Do not take this medication with any of the following: Adagrasib Ceritinib Certain antibiotics, such as chloramphenicol, clarithromycin, telithromycin Certain antivirals for HIV, such as atazanavir, cobicistat, darunavir, delavirdine, fosamprenavir, indinavir, ritonavir Certain medications for fungal infections, such as itraconazole, ketoconazole, posaconazole,  voriconazole Conivaptan Grapefruit Idelalisib Mifepristone Nefazodone Ribociclib This medication may also interact with the following: Carvedilol Certain medications for seizures, such as phenobarbital, phenytoin Ciprofloxacin Cyclosporine Eltrombopag Fluconazole Fluvoxamine Quinidine Rifampin St. John's wort Verapamil This list may not describe all possible interactions. Give your health care provider a list of all the medicines, herbs, non-prescription drugs, or dietary supplements you use. Also tell them if you smoke, drink alcohol, or use illegal drugs. Some items may interact with your medicine. What should I watch for while using this medication? Visit your care team for regular checks on your progress. Tell your care team if your symptoms do not start to get better or if they get worse. Your mouth may get dry. Chewing sugarless gum or sucking hard candy and drinking plenty of water may help. Contact your care team if the problem does not go away or is severe. What side effects may I notice from receiving this medication? Side effects that you should report to your care team as soon as possible: Allergic reactions--skin rash, itching, hives, swelling of the face, lips, tongue, or throat Side effects that usually do not require medical attention (report to your care team if they continue or are bothersome): Drowsiness Dry mouth Fatigue Nausea This list may not describe all possible side effects. Call your doctor for medical advice about side effects. You may report side effects to FDA at 1-800-FDA-1088. Where should I keep my medication? Keep out of the reach of children and pets. Store between 15 and 30 degrees C (59 and 86 degrees F). Get rid of any unused medication after the expiration date. To get rid of medications that are no longer needed or have expired: Take the medication to a medication take-back program. Check  with your pharmacy or law enforcement to find a  location. If you cannot return the medication, check the label or package insert to see if the medication should be thrown out in the garbage or flushed down the toilet. If you are not sure, ask your care team. If it is safe to put it in the trash, pour the medication out of the container. Mix the medication with cat litter, dirt, coffee grounds, or other unwanted substance. Seal the mixture in a bag or container. Put it in the trash. NOTE: This sheet is a summary. It may not cover all possible information. If you have questions about this medicine, talk to your doctor, pharmacist, or health care provider.  2023 Elsevier/Gold Standard (2021-09-24 00:00:00)

## 2022-09-16 NOTE — Progress Notes (Signed)
Consent Form Botulism Toxin Injection For Chronic Migraine 09/16/2022: stable doing well 4 migraines a month, try ubrelvu acutely samples if likes can prescribe Meds ordered this encounter  Medications   botulinum toxin Type A (BOTOX) injection 155 Units    Botox- 200 units x 1 vial Lot: [id number] Expiration: 12/2024 NDC: 6387-5643-32  Bacteriostatic 0.9% Sodium Chloride- 75mL total Lot: [id number] Expiration: 11/25 NDC: 66063-016-01  Dx: G43.709 S/P   Ubrogepant (UBRELVY) 100 MG TABS    Sig: Take 1 tablet (100 mg total) by mouth every 2 (two) hours as needed. Maximum 200mg  a day.    Dispense:  6 tablet    Refill:  0    06/23/2022: stable doing well if she needs supraorbital nerve block on the right we can do it  03/17/2022: stable, botox works great; still > 70% improvement in migraine frequency with botox. Taking 200mg  at bedtime topiramate, we have 100 listed at bedtime. She has acidoses, was recommended to decrease, would decrease 150mg  then could try 100mg  at bedtime. Don;t want to stop it, it helps with her pelvic pain and have tried stopping it int he past and migraines worsened so go down to 150mg  and then maybe 100 will see - being prescribed by another doc would just let them know when you see them.   12/23/2021: Stable as far as migraines. Referred to the [facility] [phone]/ scheduling [phone]. At [place]. Has an appointment in July  09/30/2021: Stable, still > 70% improvement in migraine frequency with botox.   Reviewed orally with patient, additionally signature is on file:  Botulism toxin has been approved by the Federal drug administration for treatment of chronic migraine. Botulism toxin does not cure chronic migraine and it may not be effective in some patients.  The administration of botulism toxin is accomplished by injecting a small amount of toxin into the muscles of the neck and head. Dosage must be titrated for each individual. Any  benefits resulting from botulism toxin tend to wear off after 3 months with a repeat injection required if benefit is to be maintained. Injections are usually done every 3-4 months with maximum effect peak achieved by about 2 or 3 weeks. Botulism toxin is expensive and you should be sure of what costs you will incur resulting from the injection.  The side effects of botulism toxin use for chronic migraine may include:   -Transient, and usually mild, facial weakness with facial injections  -Transient, and usually mild, head or neck weakness with head/neck injections  -Reduction or loss of forehead facial animation due to forehead muscle weakness  -Eyelid drooping  -Dry eye  -Pain at the site of injection or bruising at the site of injection  -Double vision  -Potential unknown long term risks  Contraindications: You should not have Botox if you are pregnant, nursing, allergic to albumin, have an infection, skin condition, or muscle weakness at the site of the injection, or have myasthenia gravis, Lambert-Eaton syndrome, or ALS.  It is also possible that as with any injection, there may be an allergic reaction or no effect from the medication. Reduced effectiveness after repeated injections is sometimes seen and rarely infection at the injection site may occur. All care will be taken to prevent these side effects. If therapy is given over a long time, atrophy and wasting in the muscle injected may occur. Occasionally the patient's become refractory to treatment because they develop antibodies to the toxin. In this event, therapy needs to be modified.  I have read the above information and consent to the administration of botulism toxin.    BOTOX PROCEDURE NOTE FOR MIGRAINE HEADACHE    Contraindications and precautions discussed with patient(above). Aseptic procedure was observed and patient tolerated procedure. Procedure performed by Dr. Georgia Dom  The condition has existed for more than 6  months, and pt does not have a diagnosis of ALS, Myasthenia Gravis or Lambert-Eaton Syndrome.  Risks and benefits of injections discussed and pt agrees to proceed with the procedure.  Written consent obtained  These injections are medically necessary. Pt  receives good benefits from these injections. These injections do not cause sedations or hallucinations which the oral therapies may cause.  Description of procedure:  The patient was placed in a sitting position. The standard protocol was used for Botox as follows, with 5 units of Botox injected at each site:   -Procerus muscle, midline injection  -Corrugator muscle, bilateral injection  -Frontalis muscle, bilateral injection, with 2 sites each side, medial injection was performed in the upper one third of the frontalis muscle, in the region vertical from the medial inferior edge of the superior orbital rim. The lateral injection was again in the upper one third of the forehead vertically above the lateral limbus of the cornea, 1.5 cm lateral to the medial injection site.  -Temporalis muscle injection, 4 sites, bilaterally. The first injection was 3 cm above the tragus of the ear, second injection site was 1.5 cm to 3 cm up from the first injection site in line with the tragus of the ear. The third injection site was 1.5-3 cm forward between the first 2 injection sites. The fourth injection site was 1.5 cm posterior to the second injection site.   -Occipitalis muscle injection, 3 sites, bilaterally. The first injection was done one half way between the occipital protuberance and the tip of the mastoid process behind the ear. The second injection site was done lateral and superior to the first, 1 fingerbreadth from the first injection. The third injection site was 1 fingerbreadth superiorly and medially from the first injection site.  -Cervical paraspinal muscle injection, 2 sites, bilateral knee first injection site was 1 cm from the midline of  the cervical spine, 3 cm inferior to the lower border of the occipital protuberance. The second injection site was 1.5 cm superiorly and laterally to the first injection site.  -Trapezius muscle injection was performed at 3 sites, bilaterally. The first injection site was in the upper trapezius muscle halfway between the inflection point of the neck, and the acromion. The second injection site was one half way between the acromion and the first injection site. The third injection was done between the first injection site and the inflection point of the neck.   Will return for repeat injection in 3 months.   155 unit sof Botox was used, 45u Botox not injected was wasted. The patient tolerated the procedure well, there were no complications of the above procedure.

## 2022-11-01 ENCOUNTER — Other Ambulatory Visit: Payer: Self-pay | Admitting: Neurology

## 2022-11-19 ENCOUNTER — Other Ambulatory Visit: Payer: Self-pay | Admitting: Internal Medicine

## 2022-11-19 DIAGNOSIS — E049 Nontoxic goiter, unspecified: Secondary | ICD-10-CM

## 2022-11-24 ENCOUNTER — Other Ambulatory Visit: Payer: Self-pay | Admitting: Neurology

## 2022-11-27 ENCOUNTER — Ambulatory Visit
Admission: RE | Admit: 2022-11-27 | Discharge: 2022-11-27 | Disposition: A | Discharge status: Home / Self Care | Payer: 59 | Source: Ambulatory Visit | Attending: Internal Medicine | Admitting: Internal Medicine

## 2022-11-27 DIAGNOSIS — E049 Nontoxic goiter, unspecified: Secondary | ICD-10-CM

## 2022-11-30 ENCOUNTER — Telehealth: Payer: Self-pay | Admitting: Neurology

## 2022-11-30 NOTE — Telephone Encounter (Signed)
[facility] unable to reach pt, sent mychart message asking her to call them for Botox delivery.

## 2022-12-14 MED ORDER — INDOMETHACIN 50 MG PO CAPS: ORAL_CAPSULE | ORAL | 3 refills | Status: AC

## 2022-12-14 NOTE — Addendum Note (Signed)
Addended by: Bertram Savin on: 12/14/2022 05:21 PM   Modules accepted: Orders

## 2022-12-15 ENCOUNTER — Ambulatory Visit (INDEPENDENT_AMBULATORY_CARE_PROVIDER_SITE_OTHER): Payer: 59 | Admitting: Neurology

## 2022-12-15 DIAGNOSIS — G43709 Chronic migraine without aura, not intractable, without status migrainosus: Secondary | ICD-10-CM

## 2022-12-15 MED ORDER — ONABOTULINUMTOXINA 200 UNITS IJ SOLR
155.0000 [IU] | Freq: Once | INTRAMUSCULAR | Status: AC
  Administered 2022-12-15: 155 [IU] via INTRAMUSCULAR

## 2022-12-15 NOTE — Progress Notes (Signed)
Botox- 200 units x 1 vial Lot: [id number] Expiration: 04/2025 NDC: 4540-9811-91  Bacteriostatic 0.9% Sodium Chloride- 4 mL  Lot: [id number] Expiration: 06/2024 NDC: 21308-657-84  Dx: O96.295 S/P  Witnessed by Elana Alm

## 2022-12-15 NOTE — Progress Notes (Signed)
Consent Form Botulism Toxin Injection For Chronic Migraine  12/15/2022: stable  09/16/2022: stable doing well 4 migraines a month, try ubrelvu acutely samples if likes can prescribe Meds ordered this encounter  Medications   botulinum toxin Type A (BOTOX) injection 155 Units    Botox- 200 units x 1 vial Lot: [id number] Expiration: 04/2025 NDC: 4540-9811-91  Bacteriostatic 0.9% Sodium Chloride- 4 mL  Lot: [id number] Expiration: 06/2024 NDC: 21308-657-84  Dx: G43.709 S/P  Witnessed by Elana Alm    06/23/2022: stable doing well if she needs supraorbital nerve block on the right we can do it  03/17/2022: stable, botox works great; still > 70% improvement in migraine frequency with botox. Taking 200mg  at bedtime topiramate, we have 100 listed at bedtime. She has acidoses, was recommended to decrease, would decrease 150mg  then could try 100mg  at bedtime. Don;t want to stop it, it helps with her pelvic pain and have tried stopping it int he past and migraines worsened so go down to 150mg  and then maybe 100 will see - being prescribed by another doc would just let them know when you see them.   12/23/2021: Stable as far as migraines. Referred to the [facility] [phone]/ scheduling [phone]. At [place]. Has an appointment in July  09/30/2021: Stable, still > 70% improvement in migraine frequency with botox.   Reviewed orally with patient, additionally signature is on file:  Botulism toxin has been approved by the Federal drug administration for treatment of chronic migraine. Botulism toxin does not cure chronic migraine and it may not be effective in some patients.  The administration of botulism toxin is accomplished by injecting a small amount of toxin into the muscles of the neck and head. Dosage must be titrated for each individual. Any benefits resulting from botulism toxin tend to wear off after 3 months with a repeat injection required if benefit is to be  maintained. Injections are usually done every 3-4 months with maximum effect peak achieved by about 2 or 3 weeks. Botulism toxin is expensive and you should be sure of what costs you will incur resulting from the injection.  The side effects of botulism toxin use for chronic migraine may include:   -Transient, and usually mild, facial weakness with facial injections  -Transient, and usually mild, head or neck weakness with head/neck injections  -Reduction or loss of forehead facial animation due to forehead muscle weakness  -Eyelid drooping  -Dry eye  -Pain at the site of injection or bruising at the site of injection  -Double vision  -Potential unknown long term risks  Contraindications: You should not have Botox if you are pregnant, nursing, allergic to albumin, have an infection, skin condition, or muscle weakness at the site of the injection, or have myasthenia gravis, Lambert-Eaton syndrome, or ALS.  It is also possible that as with any injection, there may be an allergic reaction or no effect from the medication. Reduced effectiveness after repeated injections is sometimes seen and rarely infection at the injection site may occur. All care will be taken to prevent these side effects. If therapy is given over a long time, atrophy and wasting in the muscle injected may occur. Occasionally the patient's become refractory to treatment because they develop antibodies to the toxin. In this event, therapy needs to be modified.  I have read the above information and consent to the administration of botulism toxin.    BOTOX PROCEDURE NOTE FOR MIGRAINE HEADACHE    Contraindications and precautions discussed with patient(above).  Aseptic procedure was observed and patient tolerated procedure. Procedure performed by Dr. Artemio Aly  The condition has existed for more than 6 months, and pt does not have a diagnosis of ALS, Myasthenia Gravis or Lambert-Eaton Syndrome.  Risks and benefits of  injections discussed and pt agrees to proceed with the procedure.  Written consent obtained  These injections are medically necessary. Pt  receives good benefits from these injections. These injections do not cause sedations or hallucinations which the oral therapies may cause.  Description of procedure:  The patient was placed in a sitting position. The standard protocol was used for Botox as follows, with 5 units of Botox injected at each site:   -Procerus muscle, midline injection  -Corrugator muscle, bilateral injection  -Frontalis muscle, bilateral injection, with 2 sites each side, medial injection was performed in the upper one third of the frontalis muscle, in the region vertical from the medial inferior edge of the superior orbital rim. The lateral injection was again in the upper one third of the forehead vertically above the lateral limbus of the cornea, 1.5 cm lateral to the medial injection site.  -Temporalis muscle injection, 4 sites, bilaterally. The first injection was 3 cm above the tragus of the ear, second injection site was 1.5 cm to 3 cm up from the first injection site in line with the tragus of the ear. The third injection site was 1.5-3 cm forward between the first 2 injection sites. The fourth injection site was 1.5 cm posterior to the second injection site.   -Occipitalis muscle injection, 3 sites, bilaterally. The first injection was done one half way between the occipital protuberance and the tip of the mastoid process behind the ear. The second injection site was done lateral and superior to the first, 1 fingerbreadth from the first injection. The third injection site was 1 fingerbreadth superiorly and medially from the first injection site.  -Cervical paraspinal muscle injection, 2 sites, bilateral knee first injection site was 1 cm from the midline of the cervical spine, 3 cm inferior to the lower border of the occipital protuberance. The second injection site was  1.5 cm superiorly and laterally to the first injection site.  -Trapezius muscle injection was performed at 3 sites, bilaterally. The first injection site was in the upper trapezius muscle halfway between the inflection point of the neck, and the acromion. The second injection site was one half way between the acromion and the first injection site. The third injection was done between the first injection site and the inflection point of the neck.   Will return for repeat injection in 3 months.   155 unit sof Botox was used, 45u Botox not injected was wasted. The patient tolerated the procedure well, there were no complications of the above procedure.

## 2022-12-17 ENCOUNTER — Other Ambulatory Visit: Payer: 59

## 2023-01-26 ENCOUNTER — Other Ambulatory Visit: Payer: Self-pay | Admitting: Neurology

## 2023-02-10 ENCOUNTER — Other Ambulatory Visit: Payer: Self-pay | Admitting: Neurology

## 2023-03-09 ENCOUNTER — Ambulatory Visit: Payer: 59 | Admitting: Neurology

## 2023-03-09 DIAGNOSIS — G43709 Chronic migraine without aura, not intractable, without status migrainosus: Secondary | ICD-10-CM | POA: Diagnosis not present

## 2023-03-09 MED ORDER — KETOROLAC TROMETHAMINE 60 MG/2ML IM SOLN: INTRAMUSCULAR | 4 refills | Status: AC

## 2023-03-09 MED ORDER — METHYLPREDNISOLONE 4 MG PO TBPK: ORAL_TABLET | ORAL | 1 refills | Status: DC

## 2023-03-09 MED ORDER — "BD SAFETYGLIDE SYRINGE/NEEDLE 25G X 1"" 3 ML MISC": 5 refills | Status: AC

## 2023-03-09 MED ORDER — ONABOTULINUMTOXINA 200 UNITS IJ SOLR
155.0000 [IU] | Freq: Once | INTRAMUSCULAR | Status: AC
  Administered 2023-03-09: 155 [IU] via INTRAMUSCULAR

## 2023-03-09 MED ORDER — KETOROLAC TROMETHAMINE 60 MG/2ML IM SOLN
60.0000 mg | Freq: Once | INTRAMUSCULAR | Status: AC
  Administered 2023-03-09: 60 mg via INTRAMUSCULAR

## 2023-03-09 NOTE — Progress Notes (Signed)
Consent Form Botulism Toxin Injection For Chronic Migraine  03/09/2023 stable 12/15/2022: stable  09/16/2022: stable doing well 4 migraines a month, try ubrelvu acutely samples if likes can prescribe Meds ordered this encounter  Medications   botulinum toxin Type A (BOTOX) injection 155 Units    Botox- 200 units x 1 vial Lot: [id number] Expiration: 06/2025 NDC: 0347-4259-56  Bacteriostatic 0.9% Sodium Chloride- 4 mL  Lot: [id number] Expiration: 06/2024 NDC: 3329-5188-41 Dx: G43.709 S/P  Witnessed by Diamond,CMA   ketorolac (TORADOL) 60 MG/2ML SOLN injection    Sig: Inject 2ml (60mg ) intramuscularly at onset of migraine. May repeat in 6 hours. Max twice a day and 4 days per month.    Dispense:  10 mL    Refill:  4   SYRINGE-NEEDLE, DISP, 3 ML (BD SAFETYGLIDE SYRINGE/NEEDLE) 25G X 1" 3 ML MISC    Sig: Attach needle to syringe and use to draw up and administer Toradol. Do not reuse.    Dispense:  4 each    Refill:  5   methylPREDNISolone (MEDROL DOSEPAK) 4 MG TBPK tablet    Sig: Take pills daily all together with food. Take the first dose (6 pills) as soon as possible. Take the rest each morning. For 6 days total 6-5-4-3-2-1.    Dispense:  21 tablet    Refill:  1    06/23/2022: stable doing well if she needs supraorbital nerve block on the right we can do it  03/17/2022: stable, botox works great; still > 70% improvement in migraine frequency with botox. Taking 200mg  at bedtime topiramate, we have 100 listed at bedtime. She has acidoses, was recommended to decrease, would decrease 150mg  then could try 100mg  at bedtime. Don;t want to stop it, it helps with her pelvic pain and have tried stopping it int he past and migraines worsened so go down to 150mg  and then maybe 100 will see - being prescribed by another doc would just let them know when you see them.   12/23/2021: Stable as far as migraines. Referred to the [facility] [phone]/ scheduling [phone]. At [place]. Has an appointment in July  09/30/2021: Stable, still > 70% improvement in migraine frequency with botox.   Reviewed orally with patient, additionally signature is on file:  Botulism toxin has been approved by the Federal drug administration for treatment of chronic migraine. Botulism toxin does not cure chronic migraine and it may not be effective in some patients.  The administration of botulism toxin is accomplished by injecting a small amount of toxin into the muscles of the neck and head. Dosage must be titrated for each individual. Any benefits resulting from botulism toxin tend to wear off after 3 months with a repeat injection required if benefit is to be maintained. Injections are usually done every 3-4 months with maximum effect peak achieved by about 2 or 3 weeks. Botulism toxin is expensive and you should be sure of what costs you will incur resulting from the injection.  The side effects of botulism toxin use for chronic migraine may include:   -Transient, and usually mild, facial weakness with facial injections  -Transient, and usually mild, head or neck weakness with head/neck injections  -Reduction or loss of forehead facial animation due to forehead muscle weakness  -Eyelid drooping  -Dry eye  -Pain at the site of injection or bruising at the site of injection  -Double vision  -Potential unknown long term risks  Contraindications: You should not have Botox if you are  pregnant, nursing, allergic to albumin, have an infection, skin condition, or muscle weakness at the site of the injection, or have myasthenia gravis, Lambert-Eaton syndrome, or ALS.  It is also possible that as with any injection, there may be an allergic reaction or no effect from the medication. Reduced effectiveness after repeated injections is sometimes seen and rarely infection at the injection site may occur. All care will be taken to prevent these side effects. If therapy is given over a long time,  atrophy and wasting in the muscle injected may occur. Occasionally the patient's become refractory to treatment because they develop antibodies to the toxin. In this event, therapy needs to be modified.  I have read the above information and consent to the administration of botulism toxin.    BOTOX PROCEDURE NOTE FOR MIGRAINE HEADACHE    Contraindications and precautions discussed with patient(above). Aseptic procedure was observed and patient tolerated procedure. Procedure performed by Dr. Artemio Aly  The condition has existed for more than 6 months, and pt does not have a diagnosis of ALS, Myasthenia Gravis or Lambert-Eaton Syndrome.  Risks and benefits of injections discussed and pt agrees to proceed with the procedure.  Written consent obtained  These injections are medically necessary. Pt  receives good benefits from these injections. These injections do not cause sedations or hallucinations which the oral therapies may cause.  Description of procedure:  The patient was placed in a sitting position. The standard protocol was used for Botox as follows, with 5 units of Botox injected at each site:   -Procerus muscle, midline injection  -Corrugator muscle, bilateral injection  -Frontalis muscle, bilateral injection, with 2 sites each side, medial injection was performed in the upper one third of the frontalis muscle, in the region vertical from the medial inferior edge of the superior orbital rim. The lateral injection was again in the upper one third of the forehead vertically above the lateral limbus of the cornea, 1.5 cm lateral to the medial injection site.  -Temporalis muscle injection, 4 sites, bilaterally. The first injection was 3 cm above the tragus of the ear, second injection site was 1.5 cm to 3 cm up from the first injection site in line with the tragus of the ear. The third injection site was 1.5-3 cm forward between the first 2 injection sites. The fourth injection site  was 1.5 cm posterior to the second injection site.   -Occipitalis muscle injection, 3 sites, bilaterally. The first injection was done one half way between the occipital protuberance and the tip of the mastoid process behind the ear. The second injection site was done lateral and superior to the first, 1 fingerbreadth from the first injection. The third injection site was 1 fingerbreadth superiorly and medially from the first injection site.  -Cervical paraspinal muscle injection, 2 sites, bilateral knee first injection site was 1 cm from the midline of the cervical spine, 3 cm inferior to the lower border of the occipital protuberance. The second injection site was 1.5 cm superiorly and laterally to the first injection site.  -Trapezius muscle injection was performed at 3 sites, bilaterally. The first injection site was in the upper trapezius muscle halfway between the inflection point of the neck, and the acromion. The second injection site was one half way between the acromion and the first injection site. The third injection was done between the first injection site and the inflection point of the neck.   Will return for repeat injection in 3 months.   155  unit sof Botox was used, 45u Botox not injected was wasted. The patient tolerated the procedure well, there were no complications of the above procedure.

## 2023-03-09 NOTE — Progress Notes (Signed)
Botox- 200 units x 1 vial Lot: [id number] Expiration: 06/2025 NDC: 5643-3295-18  Bacteriostatic 0.9% Sodium Chloride- 4 mL  Lot: [id number] Expiration: 06/2024 NDC: 6301-6010-93 Dx: G43.709 S/P  Witnessed by Maryjean Ka

## 2023-03-09 NOTE — Addendum Note (Signed)
Addended by: Raynald Kemp A on: 03/09/2023 04:58 PM   Modules accepted: Orders

## 2023-03-09 NOTE — Progress Notes (Addendum)
Per Destiny Ellis gave pt 60mg  of Toradol   Placed bandade on injection site Pt went to check out

## 2023-03-10 ENCOUNTER — Other Ambulatory Visit: Payer: Self-pay | Admitting: Neurology

## 2023-05-24 ENCOUNTER — Telehealth: Payer: Self-pay | Admitting: Neurology

## 2023-05-24 MED ORDER — BOTOX 200 UNITS IJ SOLR: INTRAMUSCULAR | 3 refills | Status: DC

## 2023-05-24 NOTE — Addendum Note (Signed)
Addended by: Bertram Savin on: 05/24/2023 09:59 AM   Modules accepted: Orders

## 2023-05-24 NOTE — Telephone Encounter (Signed)
Botox prescription sent to [facility].

## 2023-05-24 NOTE — Telephone Encounter (Signed)
Called [facility] to initiate PA renewal. PA was approved over the phone. Auth#: [id number] (05/24/23-05/23/24)  [facility] needs a new rx, please send when you get a chance. Thank you!

## 2023-05-31 NOTE — Telephone Encounter (Signed)
Delivery pending pt consent. LVM and sent MyChart msg asking pt to call [facility].

## 2023-06-07 ENCOUNTER — Ambulatory Visit (INDEPENDENT_AMBULATORY_CARE_PROVIDER_SITE_OTHER): Payer: 59 | Admitting: Neurology

## 2023-06-07 DIAGNOSIS — G43709 Chronic migraine without aura, not intractable, without status migrainosus: Secondary | ICD-10-CM | POA: Diagnosis not present

## 2023-06-07 MED ORDER — ONABOTULINUMTOXINA 200 UNITS IJ SOLR
155.0000 [IU] | Freq: Once | INTRAMUSCULAR | Status: AC
  Administered 2023-06-07: 155 [IU] via INTRAMUSCULAR

## 2023-06-07 NOTE — Progress Notes (Signed)
Botox- 200 units x 1 vial Lot: [id number] Expiration: 10/2025 NDC: 1093-2355-73  Bacteriostatic 0.9% Sodium Chloride- 4 mL  Lot: [id number] Expiration: 11/16/2023 NDC: 2706-2376-28  Dx: B15.176 S/P Witnessed by Cheron Every CMA

## 2023-06-07 NOTE — Progress Notes (Signed)
Consent Form Botulism Toxin Injection For Chronic Migraine  06/07/2023: Stable 03/09/2023 stable 12/15/2022: stable  09/16/2022: stable doing well 4 migraines a month, try ubrelvu acutely samples if likes can prescribe No orders of the defined types were placed in this encounter.   06/23/2022: stable doing well if she needs supraorbital nerve block on the right we can do it  03/17/2022: stable, botox works great; still > 70% improvement in migraine frequency with botox. Taking 200mg  at bedtime topiramate, we have 100 listed at bedtime. She has acidoses, was recommended to decrease, would decrease 150mg  then could try 100mg  at bedtime. Don;t want to stop it, it helps with her pelvic pain and have tried stopping it int he past and migraines worsened so go down to 150mg  and then maybe 100 will see - being prescribed by another doc would just let them know when you see them.   12/23/2021: Stable as far as migraines. Referred to the [facility] [phone]/ scheduling [phone]. At [place]. Has an appointment in July  09/30/2021: Stable, still > 70% improvement in migraine frequency with botox.   Reviewed orally with patient, additionally signature is on file:  Botulism toxin has been approved by the Federal drug administration for treatment of chronic migraine. Botulism toxin does not cure chronic migraine and it may not be effective in some patients.  The administration of botulism toxin is accomplished by injecting a small amount of toxin into the muscles of the neck and head. Dosage must be titrated for each individual. Any benefits resulting from botulism toxin tend to wear off after 3 months with a repeat injection required if benefit is to be maintained. Injections are usually done every 3-4 months with maximum effect peak achieved by about 2 or 3 weeks. Botulism toxin is expensive and you should be sure of what costs you will incur resulting from the injection.  The side  effects of botulism toxin use for chronic migraine may include:   -Transient, and usually mild, facial weakness with facial injections  -Transient, and usually mild, head or neck weakness with head/neck injections  -Reduction or loss of forehead facial animation due to forehead muscle weakness  -Eyelid drooping  -Dry eye  -Pain at the site of injection or bruising at the site of injection  -Double vision  -Potential unknown long term risks  Contraindications: You should not have Botox if you are pregnant, nursing, allergic to albumin, have an infection, skin condition, or muscle weakness at the site of the injection, or have myasthenia gravis, Lambert-Eaton syndrome, or ALS.  It is also possible that as with any injection, there may be an allergic reaction or no effect from the medication. Reduced effectiveness after repeated injections is sometimes seen and rarely infection at the injection site may occur. All care will be taken to prevent these side effects. If therapy is given over a long time, atrophy and wasting in the muscle injected may occur. Occasionally the patient's become refractory to treatment because they develop antibodies to the toxin. In this event, therapy needs to be modified.  I have read the above information and consent to the administration of botulism toxin.    BOTOX PROCEDURE NOTE FOR MIGRAINE HEADACHE    Contraindications and precautions discussed with patient(above). Aseptic procedure was observed and patient tolerated procedure. Procedure performed by Dr. Artemio Aly  The condition has existed for more than 6 months, and pt does not have a diagnosis of ALS, Myasthenia Gravis or Lambert-Eaton Syndrome.  Risks and benefits of injections discussed and pt agrees to proceed with the procedure.  Written consent obtained  These injections are medically necessary. Pt  receives good benefits from these injections. These injections do not cause sedations or hallucinations  which the oral therapies may cause.  Description of procedure:  The patient was placed in a sitting position. The standard protocol was used for Botox as follows, with 5 units of Botox injected at each site:   -Procerus muscle, midline injection  -Corrugator muscle, bilateral injection  -Frontalis muscle, bilateral injection, with 2 sites each side, medial injection was performed in the upper one third of the frontalis muscle, in the region vertical from the medial inferior edge of the superior orbital rim. The lateral injection was again in the upper one third of the forehead vertically above the lateral limbus of the cornea, 1.5 cm lateral to the medial injection site.  -Temporalis muscle injection, 4 sites, bilaterally. The first injection was 3 cm above the tragus of the ear, second injection site was 1.5 cm to 3 cm up from the first injection site in line with the tragus of the ear. The third injection site was 1.5-3 cm forward between the first 2 injection sites. The fourth injection site was 1.5 cm posterior to the second injection site.   -Occipitalis muscle injection, 3 sites, bilaterally. The first injection was done one half way between the occipital protuberance and the tip of the mastoid process behind the ear. The second injection site was done lateral and superior to the first, 1 fingerbreadth from the first injection. The third injection site was 1 fingerbreadth superiorly and medially from the first injection site.  -Cervical paraspinal muscle injection, 2 sites, bilateral knee first injection site was 1 cm from the midline of the cervical spine, 3 cm inferior to the lower border of the occipital protuberance. The second injection site was 1.5 cm superiorly and laterally to the first injection site.  -Trapezius muscle injection was performed at 3 sites, bilaterally. The first injection site was in the upper trapezius muscle halfway between the inflection point of the neck, and the  acromion. The second injection site was one half way between the acromion and the first injection site. The third injection was done between the first injection site and the inflection point of the neck.   Will return for repeat injection in 3 months.   155 unit sof Botox was used, 45u Botox not injected was wasted. The patient tolerated the procedure well, there were no complications of the above procedure.

## 2023-07-30 ENCOUNTER — Telehealth: Payer: Self-pay | Admitting: Neurology

## 2023-07-30 NOTE — Telephone Encounter (Signed)
[facility]) faxed over PA for AJOVY 225 MG/1.5ML SOAJ and Botox. Fax to [phone]. Checking to see if you received. If received reply with most recent 6 months clinical notes. If notes are outside of 6 months PA may get denied.  Will re-faxing today to make you have received it.

## 2023-08-02 NOTE — Telephone Encounter (Signed)
Completed Botox portion of PA form and placed in nurse pod for MD signature.

## 2023-08-02 NOTE — Telephone Encounter (Signed)
Faxed along with OV notes to [phone].

## 2023-08-03 ENCOUNTER — Other Ambulatory Visit (HOSPITAL_COMMUNITY): Payer: Self-pay

## 2023-08-03 ENCOUNTER — Telehealth: Payer: Self-pay

## 2023-08-03 NOTE — Telephone Encounter (Signed)
     Could not find documentation for the above questions-the last few encounters with [facility] just state stable. Please advise-

## 2023-08-03 NOTE — Telephone Encounter (Signed)
Phone room asked me to take call from [facility]. I spoke with rep Cindy. She was calling asking about the Ajovy PA. She received the Botox portion but needs the Ajovy portion back. This was put in the PA boxes yesterday morning. I told her that I did not complete the Ajovy portion of PAs but would pass the message along to the pod.

## 2023-08-03 NOTE — Telephone Encounter (Signed)
This was faxed to the PA team previously but I just faxed it again.

## 2023-08-03 NOTE — Telephone Encounter (Signed)
PA request has been  awaiting response from clinic . New Encounter created for follow up. For additional info see Pharmacy Prior Auth telephone encounter from 08/03/2023.

## 2023-08-05 NOTE — Telephone Encounter (Signed)
Faxed completed PA Form along with clinicals to [facility] at [phone].

## 2023-08-09 NOTE — Telephone Encounter (Addendum)
   I called [facility] to follow up on PA and the Rep states there should be a determination within the next 24 to 48 hours.  Call Reference: K.T. 08/09/2023 10:55AM

## 2023-08-16 NOTE — Telephone Encounter (Addendum)
  Pharmacy Patient Advocate Encounter  Received notification from  [facility]  that Prior Authorization for Ajovy has been DENIED.  Full denial letter will be uploaded to the media tab. See denial reason below.   PA #/Case ID/Reference #: N/A Uses Member ID: [id number]  It is more than likely due to when she comes for her botox treatment Ajovy was not mentioned. However I did fax the nurse note where you asked her how she was doing and how many migraines she was having. Some insurances do not accept nurse notes.

## 2023-08-16 NOTE — Telephone Encounter (Signed)
Haleigh let patient know it was denied but insurances change every year sow e can try again in January or when I see her for botox next thanks

## 2023-08-23 ENCOUNTER — Telehealth: Payer: Self-pay

## 2023-08-23 ENCOUNTER — Other Ambulatory Visit (HOSPITAL_COMMUNITY): Payer: Self-pay

## 2023-08-23 NOTE — Telephone Encounter (Signed)
 PA request has been Submitted. New Encounter created for follow up. For additional info see Pharmacy Prior Auth telephone encounter from 08/22/2022.

## 2023-08-23 NOTE — Telephone Encounter (Signed)
 Pharmacy Patient Advocate Encounter   Received notification from Physician's Office that prior authorization for Ajovy  225mg /1.38ml autoinjectors is required/requested.   Insurance verification completed.   The patient is insured through  [employer]  .     Per test claim: PA required; PA submitted to above mentioned insurance via Fax Key/confirmation #/EOC N/A Status is pending  Faxed PA form along with chart notes to [phone]

## 2023-08-30 ENCOUNTER — Other Ambulatory Visit (HOSPITAL_COMMUNITY): Payer: Self-pay

## 2023-08-30 ENCOUNTER — Ambulatory Visit (INDEPENDENT_AMBULATORY_CARE_PROVIDER_SITE_OTHER): Payer: Commercial Managed Care - PPO | Admitting: Neurology

## 2023-08-30 DIAGNOSIS — G43709 Chronic migraine without aura, not intractable, without status migrainosus: Secondary | ICD-10-CM

## 2023-08-30 MED ORDER — ONABOTULINUMTOXINA 200 UNITS IJ SOLR
155.0000 [IU] | Freq: Once | INTRAMUSCULAR | Status: AC
  Administered 2023-08-30: 155 [IU] via INTRAMUSCULAR

## 2023-08-30 NOTE — Progress Notes (Signed)
 Consent Form Botulism Toxin Injection For Chronic Migraine 08/30/2023: > 70% improvement in headache and migraine freq and severity. Now with 4 migraine days a month only. (Baseline was 20 headache days a months and >12 migrainous ) However she is having worsening igraines since being on the Ajovy  she just got approved.  06/07/2023: Stable 03/09/2023 stable 12/15/2022: stable  09/16/2022: stable doing well 4 migraines a month, try ubrelvu acutely samples if likes can prescribe Meds ordered this encounter  Medications   botulinum toxin Type A  (BOTOX ) injection 155 Units    Botox - 200 units x 1 vial Lot: [id number] Expiration: 11/2025 NDC: 9976-6078-97  Dx: H56.290 S/P  Witnessed by Nena GRADE RN    06/23/2022: stable doing well if she needs supraorbital nerve block on the right we can do it  03/17/2022: stable, botox  works great; still > 70% improvement in migraine frequency with botox . Taking 200mg  at bedtime topiramate , we have 100 listed at bedtime. She has acidoses, was recommended to decrease, would decrease 150mg  then could try 100mg  at bedtime. Don;t want to stop it, it helps with her pelvic pain and have tried stopping it int he past and migraines worsened so go down to 150mg  and then maybe 100 will see - being prescribed by another doc would just let them know when you see them.   12/23/2021: Stable as far as migraines. Referred to the [facility] [phone]/ scheduling [phone]. At [place]. Has an appointment in July  09/30/2021: Stable, still > 70% improvement in migraine frequency with botox .   Reviewed orally with patient, additionally signature is on file:  Botulism toxin has been approved by the Federal drug administration for treatment of chronic migraine. Botulism toxin does not cure chronic migraine and it may not be effective in some patients.  The administration of botulism toxin is accomplished by injecting a small amount of toxin into the muscles of  the neck and head. Dosage must be titrated for each individual. Any benefits resulting from botulism toxin tend to wear off after 3 months with a repeat injection required if benefit is to be maintained. Injections are usually done every 3-4 months with maximum effect peak achieved by about 2 or 3 weeks. Botulism toxin is expensive and you should be sure of what costs you will incur resulting from the injection.  The side effects of botulism toxin use for chronic migraine may include:   -Transient, and usually mild, facial weakness with facial injections  -Transient, and usually mild, head or neck weakness with head/neck injections  -Reduction or loss of forehead facial animation due to forehead muscle weakness  -Eyelid drooping  -Dry eye  -Pain at the site of injection or bruising at the site of injection  -Double vision  -Potential unknown long term risks  Contraindications: You should not have Botox  if you are pregnant, nursing, allergic to albumin, have an infection, skin condition, or muscle weakness at the site of the injection, or have myasthenia gravis, Lambert-Eaton syndrome, or ALS.  It is also possible that as with any injection, there may be an allergic reaction or no effect from the medication. Reduced effectiveness after repeated injections is sometimes seen and rarely infection at the injection site may occur. All care will be taken to prevent these side effects. If therapy is given over a long time, atrophy and wasting in the muscle injected may occur. Occasionally the patient's become refractory to treatment because they develop antibodies to the toxin. In this event, therapy  needs to be modified.  I have read the above information and consent to the administration of botulism toxin.    BOTOX  PROCEDURE NOTE FOR MIGRAINE HEADACHE    Contraindications and precautions discussed with patient(above). Aseptic procedure was observed and patient tolerated procedure. Procedure  performed by Dr. Andree Epp  The condition has existed for more than 6 months, and pt does not have a diagnosis of ALS, Myasthenia Gravis or Lambert-Eaton Syndrome.  Risks and benefits of injections discussed and pt agrees to proceed with the procedure.  Written consent obtained  These injections are medically necessary. Pt  receives good benefits from these injections. These injections do not cause sedations or hallucinations which the oral therapies may cause.  Description of procedure:  The patient was placed in a sitting position. The standard protocol was used for Botox  as follows, with 5 units of Botox  injected at each site:   -Procerus muscle, midline injection  -Corrugator muscle, bilateral injection  -Frontalis muscle, bilateral injection, with 2 sites each side, medial injection was performed in the upper one third of the frontalis muscle, in the region vertical from the medial inferior edge of the superior orbital rim. The lateral injection was again in the upper one third of the forehead vertically above the lateral limbus of the cornea, 1.5 cm lateral to the medial injection site.  -Temporalis muscle injection, 4 sites, bilaterally. The first injection was 3 cm above the tragus of the ear, second injection site was 1.5 cm to 3 cm up from the first injection site in line with the tragus of the ear. The third injection site was 1.5-3 cm forward between the first 2 injection sites. The fourth injection site was 1.5 cm posterior to the second injection site.   -Occipitalis muscle injection, 3 sites, bilaterally. The first injection was done one half way between the occipital protuberance and the tip of the mastoid process behind the ear. The second injection site was done lateral and superior to the first, 1 fingerbreadth from the first injection. The third injection site was 1 fingerbreadth superiorly and medially from the first injection site.  -Cervical paraspinal muscle injection, 2  sites, bilateral knee first injection site was 1 cm from the midline of the cervical spine, 3 cm inferior to the lower border of the occipital protuberance. The second injection site was 1.5 cm superiorly and laterally to the first injection site.  -Trapezius muscle injection was performed at 3 sites, bilaterally. The first injection site was in the upper trapezius muscle halfway between the inflection point of the neck, and the acromion. The second injection site was one half way between the acromion and the first injection site. The third injection was done between the first injection site and the inflection point of the neck.   Will return for repeat injection in 3 months.   155 unit sof Botox  was used, 45u Botox  not injected was wasted. The patient tolerated the procedure well, there were no complications of the above procedure.

## 2023-08-30 NOTE — Telephone Encounter (Signed)
Pt aware.

## 2023-08-30 NOTE — Telephone Encounter (Signed)
 Pharmacy Patient Advocate Encounter  Received notification from  [employer]  that Prior Authorization for Ajovy  has been APPROVED from 08/26/2023 to 08/25/2024   PA #/Case ID/Reference #: [id number]   [employer] stated they will call PT pharmacy to have them go ahead and fill the medication because they will have to give them an override code to fill.

## 2023-08-30 NOTE — Progress Notes (Signed)
 Botox- 200 units x 1 vial Lot: [id number] Expiration: 11/2025 NDC: 8841-6606-30  Bacteriostatic 0.9% Sodium Chloride- 4 mL  Lot: [id number] Expiration: 11/16/2023 NDC: 3235-5732-20  Dx: U54.270 S/P  Witnessed by Truitt Leep RN

## 2023-09-14 ENCOUNTER — Other Ambulatory Visit: Payer: Self-pay | Admitting: Neurology

## 2023-11-03 IMAGING — CT CT ABD-PELV W/ CM
2 of 4 series · 16 of 46 positions shown, 18 images · IV contrast (APPLIED)
Comparison: CT 04/07/2017, pelvic ultrasound 04/07/2017

CLINICAL DATA: Left lower quadrant pain

EXAM:
CT ABDOMEN AND PELVIS WITH CONTRAST
TECHNIQUE: Multidetector CT imaging of the abdomen and pelvis was performed
using the standard protocol following bolus administration of
intravenous contrast.

[Series 2: abd pel w · axial · 0.64mm/px · z∈[-474,-54]mm · 13 of 92 slices shown, 15 images]
[im 4/92  soft-tissue]
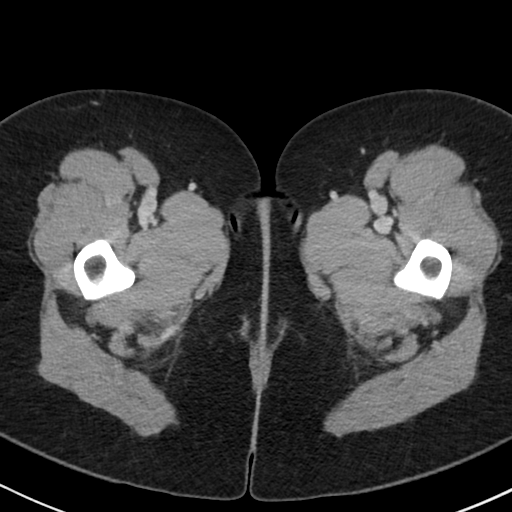
[im 4/92  bone]
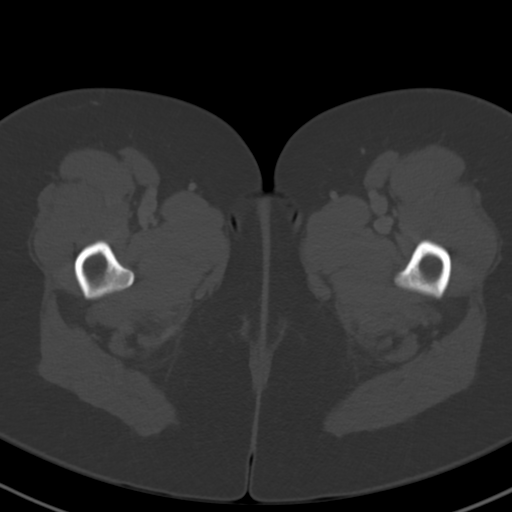
[im 12/92  soft-tissue]
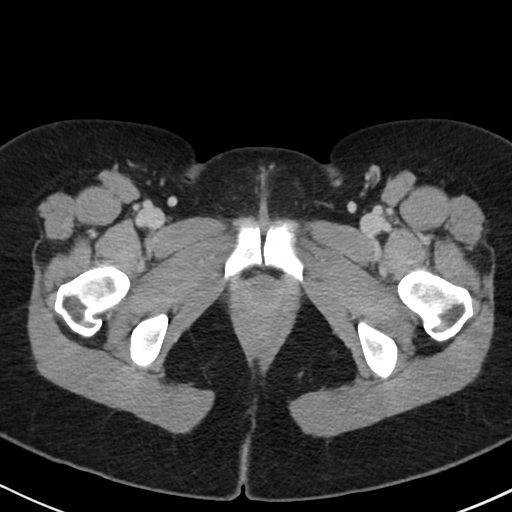
[im 20/92  soft-tissue]
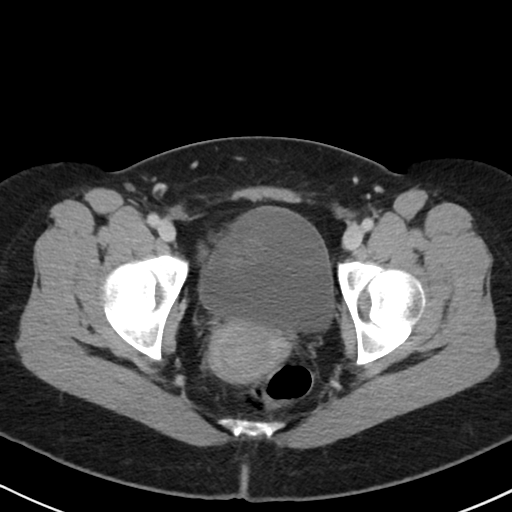
[im 24/92  soft-tissue]
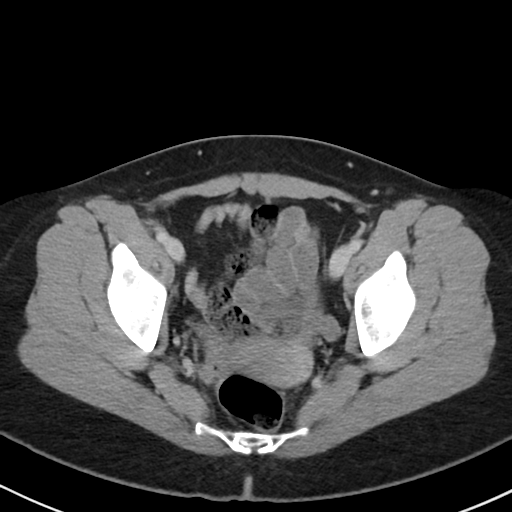
[im 32/92  soft-tissue]
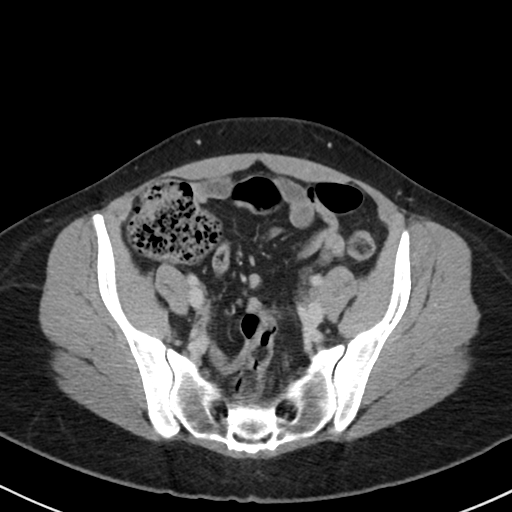
[im 40/92  soft-tissue]
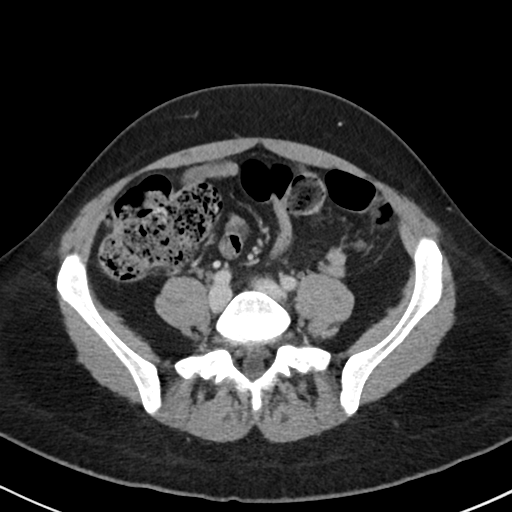
[im 48/92  soft-tissue]
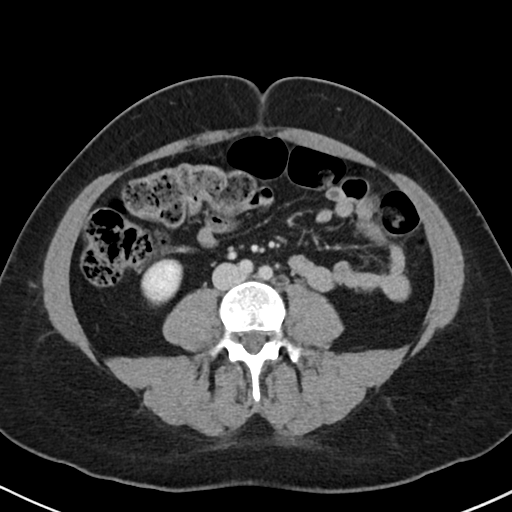
[im 52/92  soft-tissue]
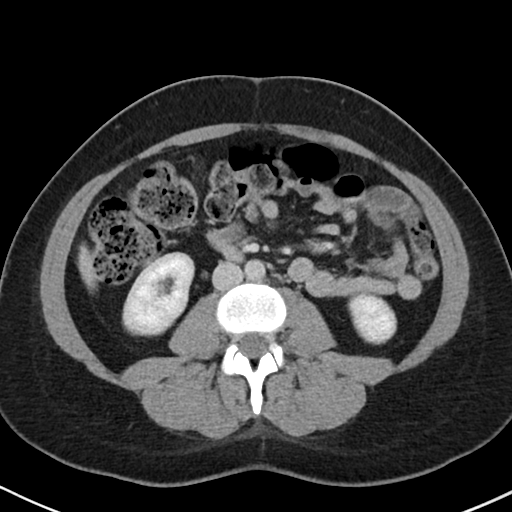
[im 60/92  soft-tissue]
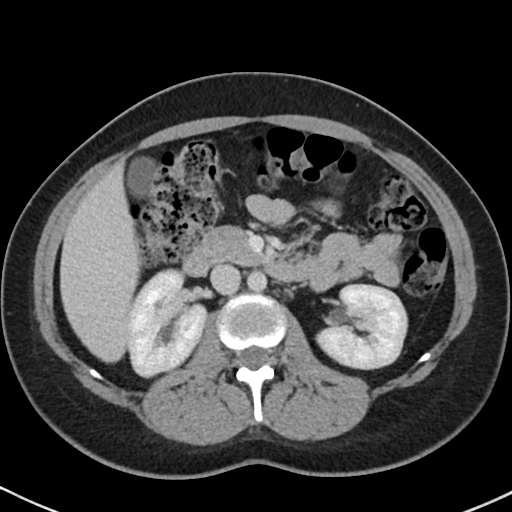
[im 60/92  bone]
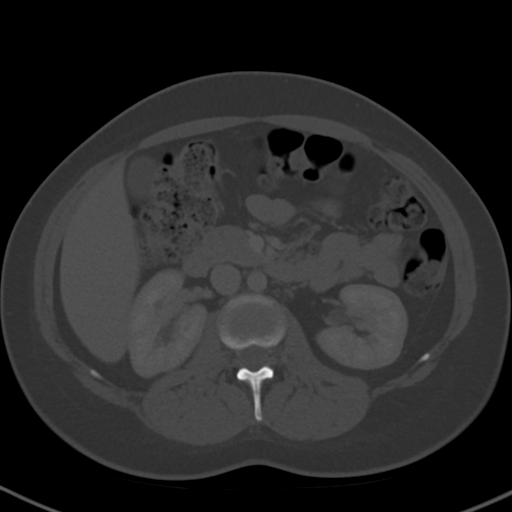
[im 68/92  soft-tissue]
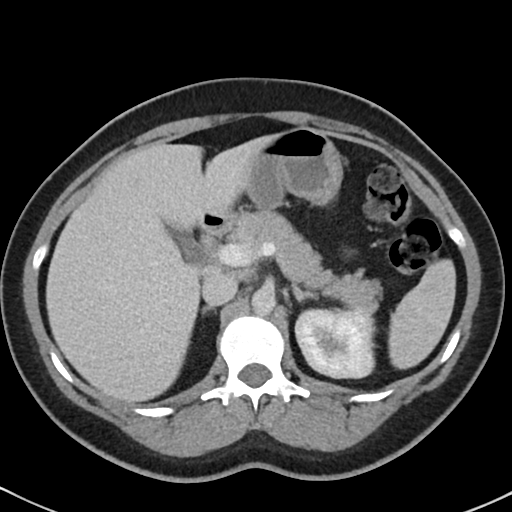
[im 72/92  soft-tissue]
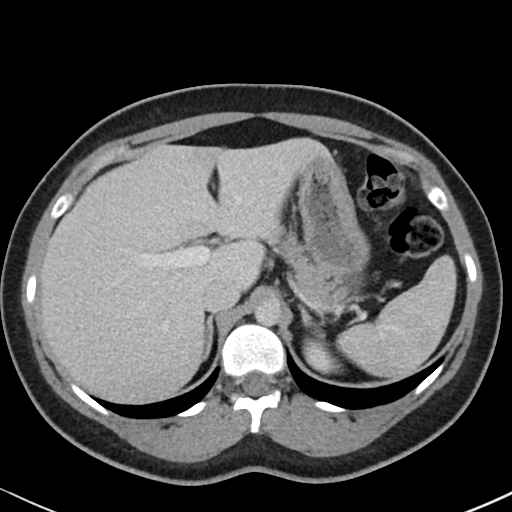
[im 80/92  soft-tissue]
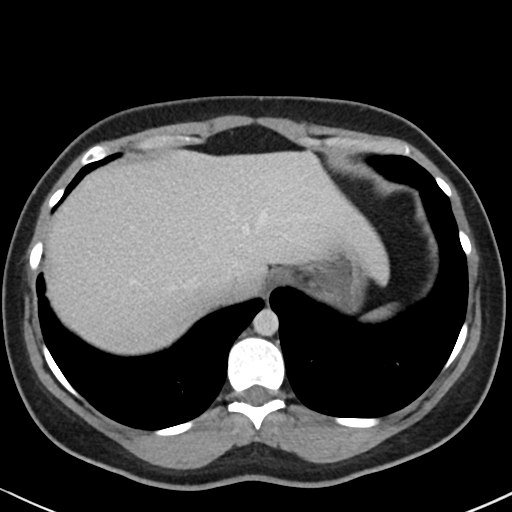
[im 88/92  soft-tissue]
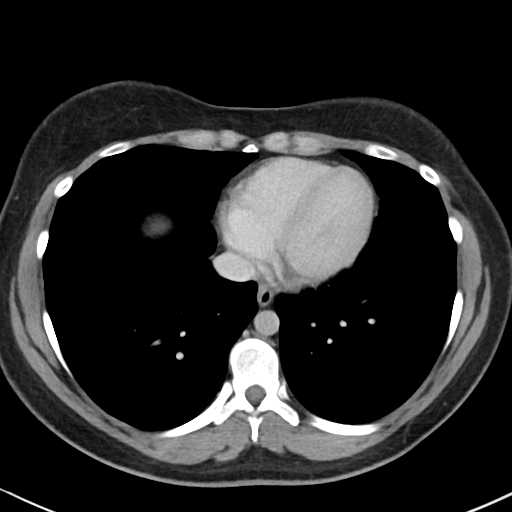

[Series 5: coronal · coronal · 0.68mm/px · 3 of 94 slices shown]
[im 32/94  soft-tissue]
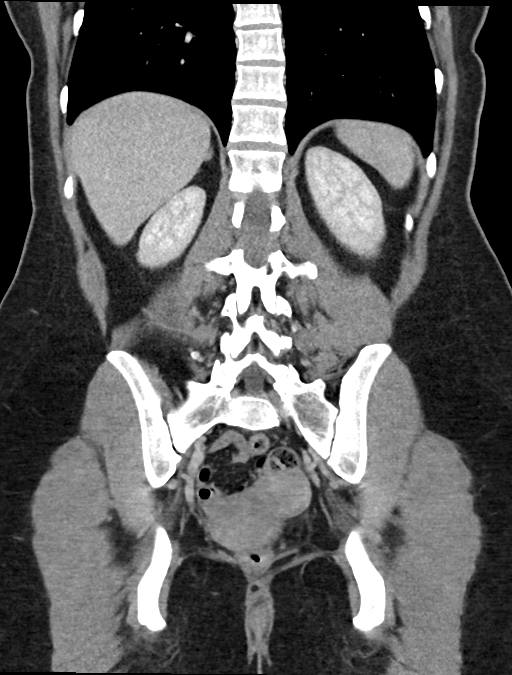
[im 42/94  soft-tissue]
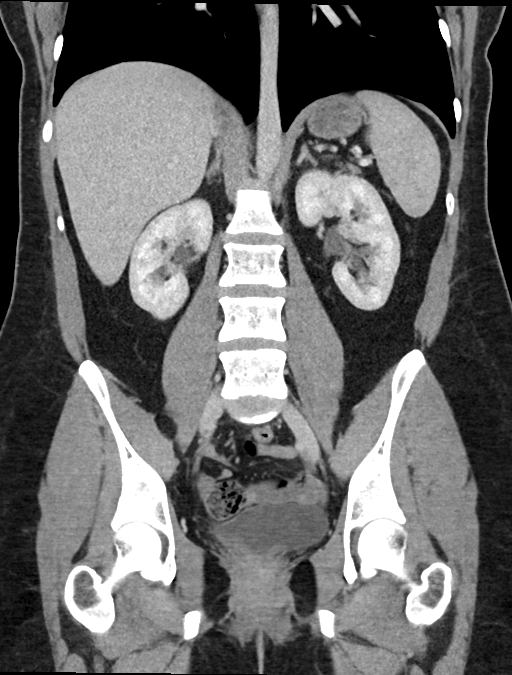
[im 52/94  soft-tissue]
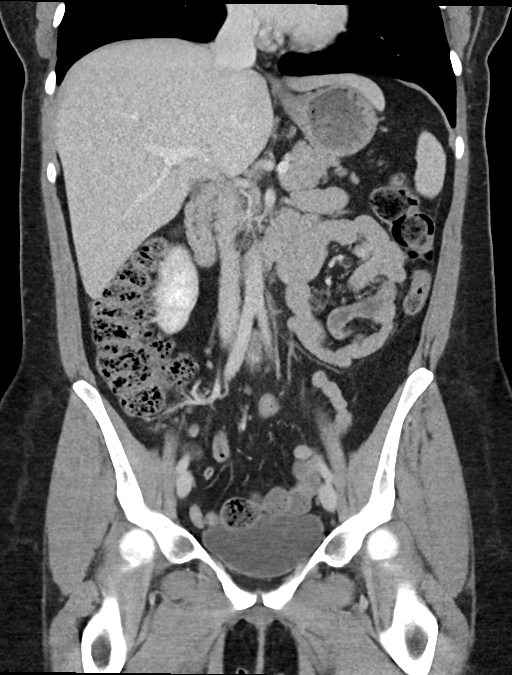

[16 of 46 positions shown; findings below may reference images not displayed]

RADIATION DOSE REDUCTION: This exam was performed according to the
departmental dose-optimization program which includes automated
exposure control, adjustment of the mA and/or kV according to
patient size and/or use of iterative reconstruction technique.

CONTRAST:  80mL OMNIPAQUE IOHEXOL 300 MG/ML  SOLN
FINDINGS: Lower chest: No acute abnormality.

Hepatobiliary: No focal liver abnormality is seen. No gallstones,
gallbladder wall thickening, or biliary dilatation.

Pancreas: Unremarkable. No pancreatic ductal dilatation or
surrounding inflammatory changes.

Spleen: Normal in size without focal abnormality.

Adrenals/Urinary Tract: Adrenal glands are normal. Punctate stone
upper pole left kidney. Prominent right renal pelvis without
convincing hydronephrosis. No ureteral stone. The bladder is normal.

Stomach/Bowel: Stomach is within normal limits. Appendix appears
normal. No evidence of bowel wall thickening, distention, or
inflammatory changes.

Vascular/Lymphatic: No significant vascular findings are present. No
enlarged abdominal or pelvic lymph nodes.

Reproductive: Uterus and bilateral adnexa are unremarkable.

Other: No abdominal wall hernia or abnormality. No abdominopelvic
ascites.

Musculoskeletal: No acute or significant osseous findings.
IMPRESSION: 1. No CT evidence for acute intra-abdominal or pelvic abnormality.
2. Punctate nonobstructing left kidney stone

## 2023-11-03 IMAGING — US US PELVIS COMPLETE TRANSABD/TRANSVAG W DUPLEX AND/OR DOPPLER
1 series · 13 of 25 positions shown · non-contrast
Comparison: None.

CLINICAL DATA: Left lower quadrant pain

EXAM:
TRANSABDOMINAL AND TRANSVAGINAL ULTRASOUND OF PELVIS
DOPPLER ULTRASOUND OF OVARIES
TECHNIQUE: Both transabdominal and transvaginal ultrasound examinations of the
pelvis were performed. Transabdominal technique was performed for
global imaging of the pelvis including uterus, ovaries, adnexal
regions, and pelvic cul-de-sac.
It was necessary to proceed with endovaginal exam following the
transabdominal exam to visualize the uterus and ovaries. Color and
duplex Doppler ultrasound was utilized to evaluate blood flow to the
ovaries.

[Series 1: us pelvic complete w transvaginal and torsion righ · 13 of 161 slices shown]
[im 1/161]
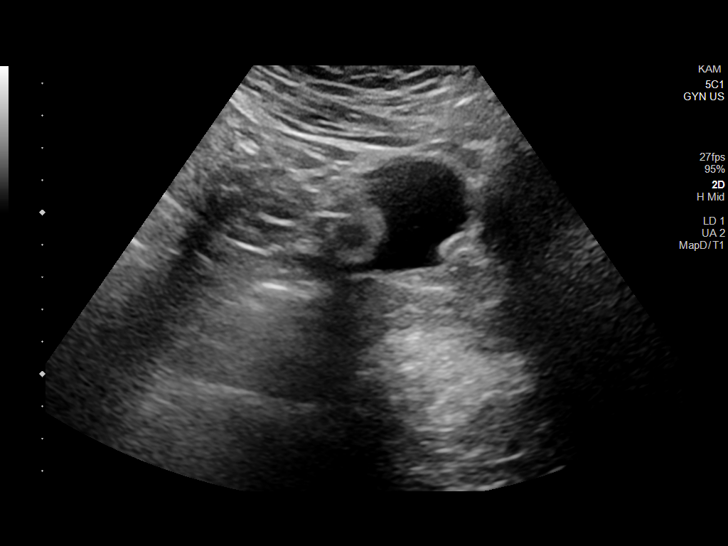
[im 14/161]
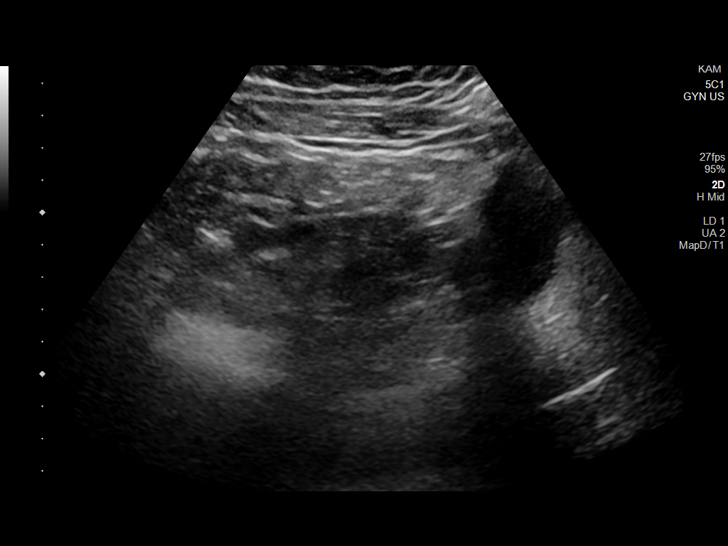
[im 27/161]
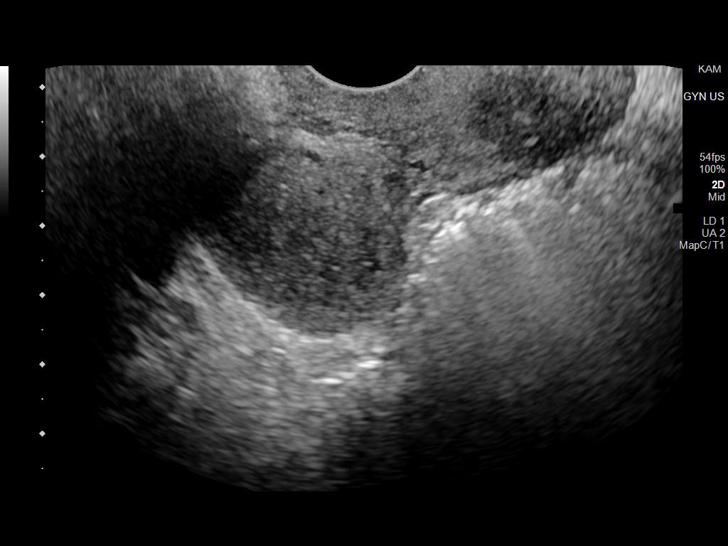
[im 41/161]
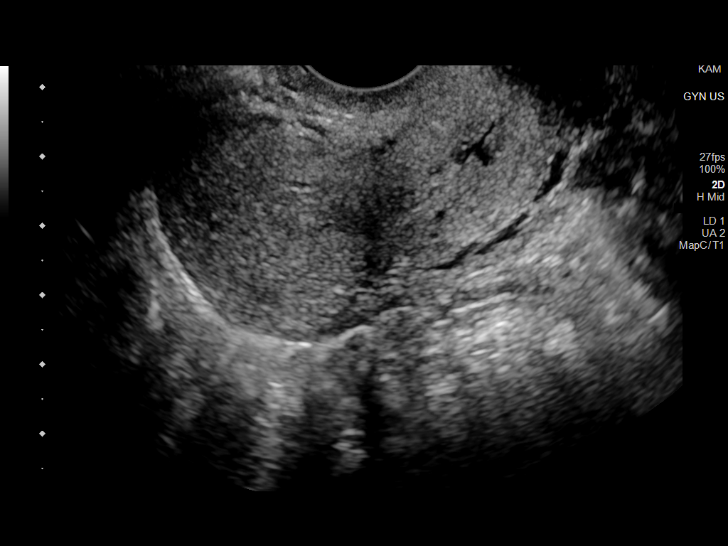
[im 54/161]
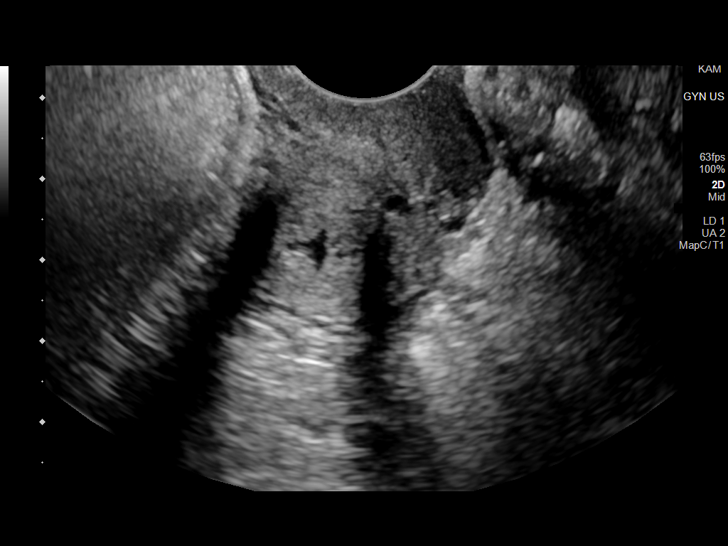
[im 67/161]
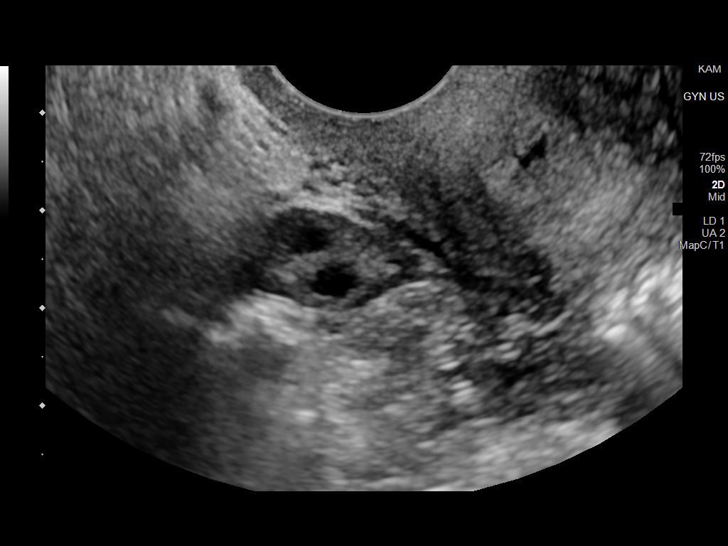
[im 81/161]
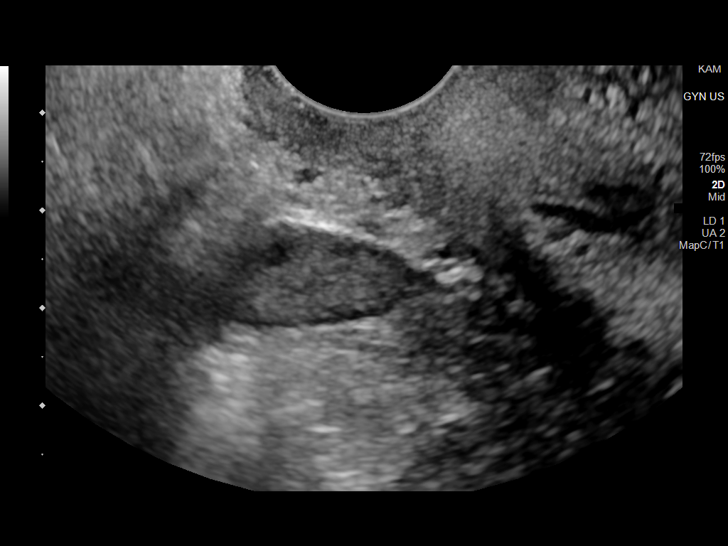
[im 94/161]
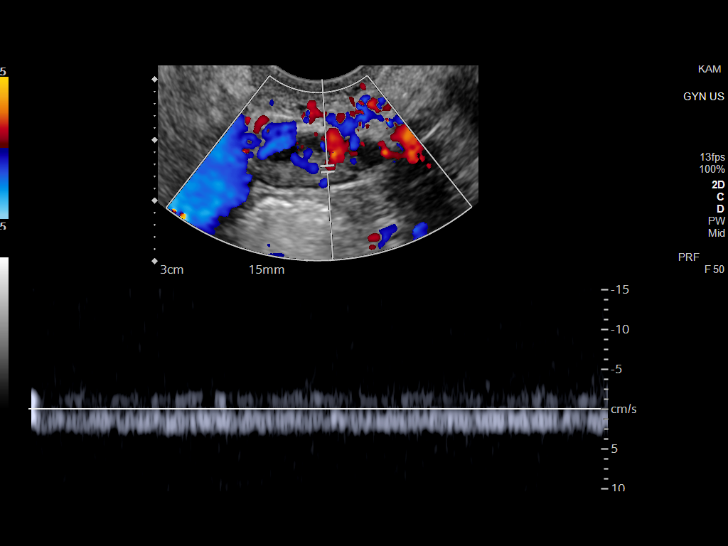
[im 107/161]
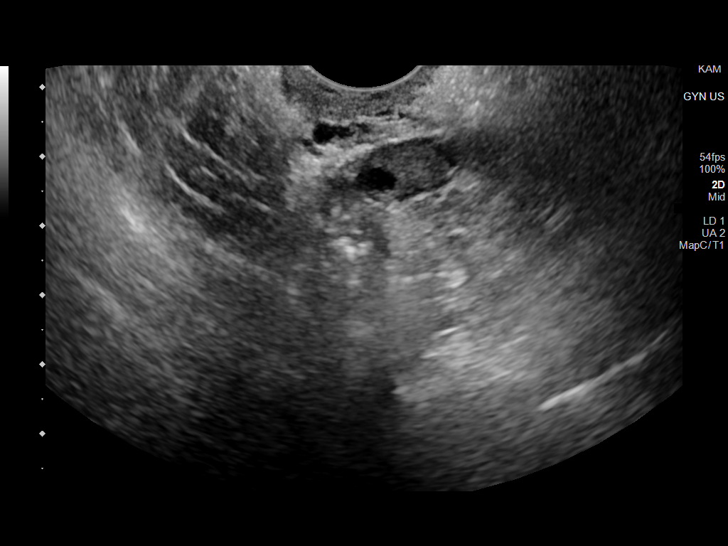
[im 121/161]
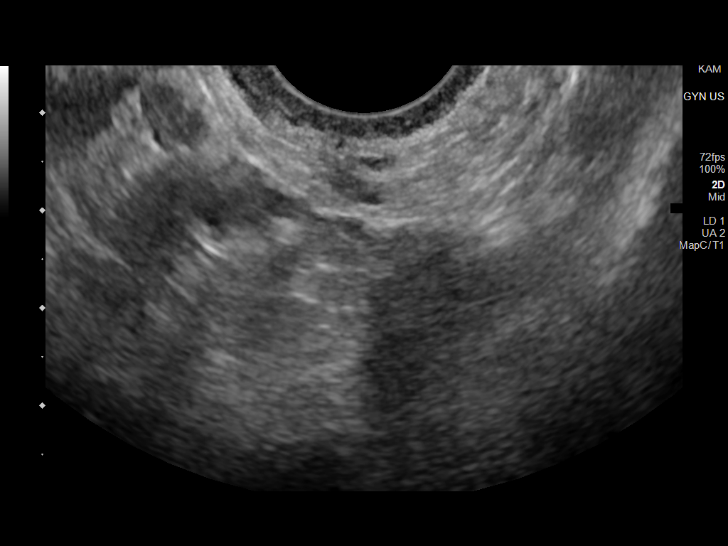
[im 134/161]
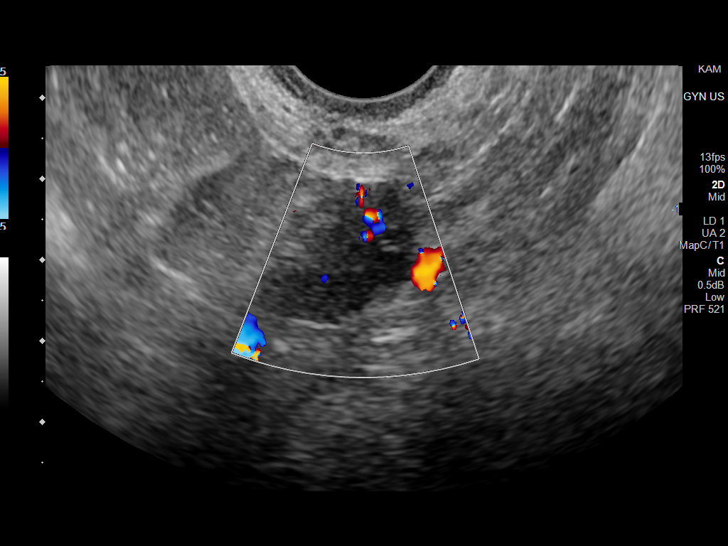
[im 147/161]
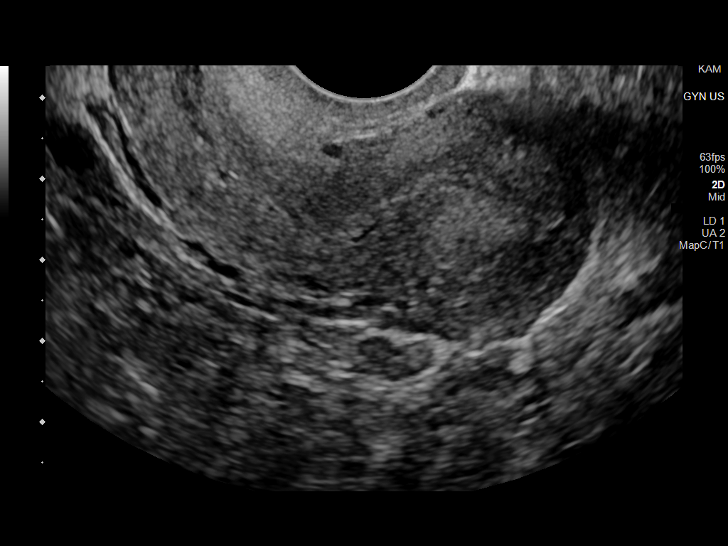
[im 161/161]
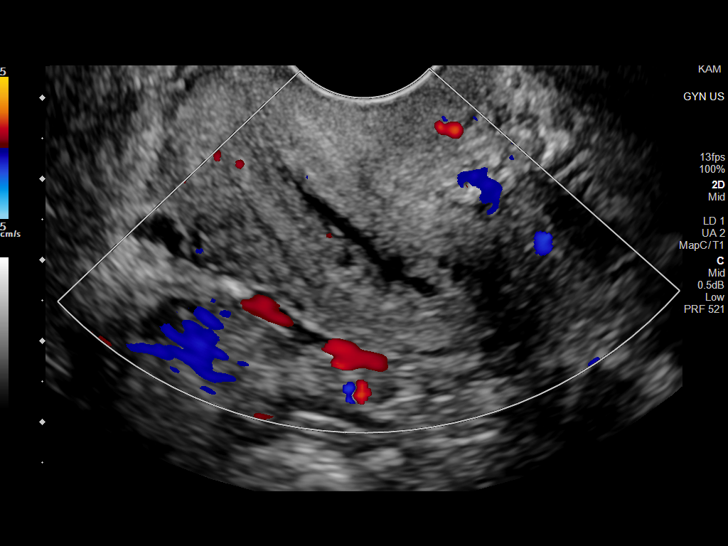

[13 of 25 positions shown; findings below may reference images not displayed]

FINDINGS: Uterus

Measurements: 6.8 x 3.5 x 3.4 cm = volume: 42 mL. Heterogeneous
echotexture without discrete mass

Endometrium

Thickness: 14 mm.  No focal abnormality visualized.

Right ovary

Measurements: 2.1 x 1.1 x 2.1 cm = volume: 2.4 mL. Normal
appearance/no adnexal mass.

Left ovary

Measurements: 2.3 x 1.7 x 1.2 cm = volume: 2.4 mL. Relatively high
position in the pelvis limits visualization.

Pulsed Doppler evaluation of both ovaries demonstrates normal
low-resistance arterial and venous waveforms.

Other findings

No abnormal free fluid.
IMPRESSION: No acute abnormality of the pelvis.

## 2023-11-16 NOTE — Telephone Encounter (Signed)
 Tried to submit PA via [facility] website in case pt needs to be B/B, no PA is required for [id number] or [id number]. Decision ID: [id number]

## 2023-11-18 NOTE — Telephone Encounter (Signed)
 Also verified by phone that PA is not required for B/B, reference #  Dorothyann Peng [id number].

## 2023-11-18 NOTE — Telephone Encounter (Signed)
 [facility] is now saying they are out of network as well. We will do B/B for pt at her appt on 4/7.

## 2023-11-22 ENCOUNTER — Ambulatory Visit (INDEPENDENT_AMBULATORY_CARE_PROVIDER_SITE_OTHER): Payer: 59 | Admitting: Neurology

## 2023-11-22 DIAGNOSIS — G43709 Chronic migraine without aura, not intractable, without status migrainosus: Secondary | ICD-10-CM

## 2023-11-22 MED ORDER — ONABOTULINUMTOXINA 200 UNITS IJ SOLR
155.0000 [IU] | Freq: Once | INTRAMUSCULAR | Status: AC
  Administered 2023-11-22: 155 [IU] via INTRAMUSCULAR

## 2023-11-22 MED ORDER — ONDANSETRON HCL 8 MG PO TABS: ORAL_TABLET | ORAL | 2 refills | Status: DC

## 2023-11-22 NOTE — Progress Notes (Signed)
 Botox- 200 units x 1 vial Lot: [id number] Expiration: 11/2025 NDC: 1478-2956-21  Bacteriostatic 0.9% Sodium Chloride- 4 mL  Lot: [id number] Expiration: 06/17/2024 NDC: 8469-6295-28  Dx: U13.244 B/B Witnessed by Truitt Leep RN

## 2023-11-22 NOTE — Progress Notes (Signed)
 Consent Form Botulism Toxin Injection For Chronic Migraine 11/22/2023: stable doing great 08/30/2023: > 70% improvement in headache and migraine freq and severity. Now with 4 migraine days a month only. (Baseline was 20 headache days a months and >12 migrainous )  Ajovy she just got approved.  06/07/2023: Stable 03/09/2023 stable 12/15/2022: stable  09/16/2022: stable doing well 4 migraines a month, try ubrelvu acutely samples if likes can prescribe No orders of the defined types were placed in this encounter.   06/23/2022: stable doing well if she needs supraorbital nerve block on the right we can do it  03/17/2022: stable, botox works great; still > 70% improvement in migraine frequency with botox. Taking 200mg  at bedtime topiramate, we have 100 listed at bedtime. She has acidoses, was recommended to decrease, would decrease 150mg  then could try 100mg  at bedtime. Don;t want to stop it, it helps with her pelvic pain and have tried stopping it int he past and migraines worsened so go down to 150mg  and then maybe 100 will see - being prescribed by another doc would just let them know when you see them.   12/23/2021: Stable as far as migraines. Referred to the [facility] [phone]/ scheduling [phone]. At [place]. Has an appointment in July  09/30/2021: Stable, still > 70% improvement in migraine frequency with botox.   Reviewed orally with patient, additionally signature is on file:  Botulism toxin has been approved by the Federal drug administration for treatment of chronic migraine. Botulism toxin does not cure chronic migraine and it may not be effective in some patients.  The administration of botulism toxin is accomplished by injecting a small amount of toxin into the muscles of the neck and head. Dosage must be titrated for each individual. Any benefits resulting from botulism toxin tend to wear off after 3 months with a repeat injection required if benefit is to be  maintained. Injections are usually done every 3-4 months with maximum effect peak achieved by about 2 or 3 weeks. Botulism toxin is expensive and you should be sure of what costs you will incur resulting from the injection.  The side effects of botulism toxin use for chronic migraine may include:   -Transient, and usually mild, facial weakness with facial injections  -Transient, and usually mild, head or neck weakness with head/neck injections  -Reduction or loss of forehead facial animation due to forehead muscle weakness  -Eyelid drooping  -Dry eye  -Pain at the site of injection or bruising at the site of injection  -Double vision  -Potential unknown long term risks  Contraindications: You should not have Botox if you are pregnant, nursing, allergic to albumin, have an infection, skin condition, or muscle weakness at the site of the injection, or have myasthenia gravis, Lambert-Eaton syndrome, or ALS.  It is also possible that as with any injection, there may be an allergic reaction or no effect from the medication. Reduced effectiveness after repeated injections is sometimes seen and rarely infection at the injection site may occur. All care will be taken to prevent these side effects. If therapy is given over a long time, atrophy and wasting in the muscle injected may occur. Occasionally the patient's become refractory to treatment because they develop antibodies to the toxin. In this event, therapy needs to be modified.  I have read the above information and consent to the administration of botulism toxin.    BOTOX PROCEDURE NOTE FOR MIGRAINE HEADACHE    Contraindications and precautions discussed with  patient(above). Aseptic procedure was observed and patient tolerated procedure. Procedure performed by Dr. Artemio Aly  The condition has existed for more than 6 months, and pt does not have a diagnosis of ALS, Myasthenia Gravis or Lambert-Eaton Syndrome.  Risks and benefits of  injections discussed and pt agrees to proceed with the procedure.  Written consent obtained  These injections are medically necessary. Pt  receives good benefits from these injections. These injections do not cause sedations or hallucinations which the oral therapies may cause.  Description of procedure:  The patient was placed in a sitting position. The standard protocol was used for Botox as follows, with 5 units of Botox injected at each site:   -Procerus muscle, midline injection  -Corrugator muscle, bilateral injection  -Frontalis muscle, bilateral injection, with 2 sites each side, medial injection was performed in the upper one third of the frontalis muscle, in the region vertical from the medial inferior edge of the superior orbital rim. The lateral injection was again in the upper one third of the forehead vertically above the lateral limbus of the cornea, 1.5 cm lateral to the medial injection site.  -Temporalis muscle injection, 4 sites, bilaterally. The first injection was 3 cm above the tragus of the ear, second injection site was 1.5 cm to 3 cm up from the first injection site in line with the tragus of the ear. The third injection site was 1.5-3 cm forward between the first 2 injection sites. The fourth injection site was 1.5 cm posterior to the second injection site.   -Occipitalis muscle injection, 3 sites, bilaterally. The first injection was done one half way between the occipital protuberance and the tip of the mastoid process behind the ear. The second injection site was done lateral and superior to the first, 1 fingerbreadth from the first injection. The third injection site was 1 fingerbreadth superiorly and medially from the first injection site.  -Cervical paraspinal muscle injection, 2 sites, bilateral knee first injection site was 1 cm from the midline of the cervical spine, 3 cm inferior to the lower border of the occipital protuberance. The second injection site was  1.5 cm superiorly and laterally to the first injection site.  -Trapezius muscle injection was performed at 3 sites, bilaterally. The first injection site was in the upper trapezius muscle halfway between the inflection point of the neck, and the acromion. The second injection site was one half way between the acromion and the first injection site. The third injection was done between the first injection site and the inflection point of the neck.   Will return for repeat injection in 3 months.   155 unit sof Botox was used, 45u Botox not injected was wasted. The patient tolerated the procedure well, there were no complications of the above procedure.

## 2024-01-02 ENCOUNTER — Encounter: Payer: Self-pay | Admitting: Neurology

## 2024-01-03 MED ORDER — ALMOTRIPTAN MALATE 12.5 MG PO TABS: ORAL_TABLET | ORAL | 2 refills | Status: DC

## 2024-02-02 ENCOUNTER — Encounter: Payer: Self-pay | Admitting: Neurology

## 2024-02-02 ENCOUNTER — Other Ambulatory Visit: Payer: Self-pay | Admitting: Neurology

## 2024-02-02 NOTE — Telephone Encounter (Signed)
 Ajovy  Rx request approved in Rx refill queue.

## 2024-02-14 ENCOUNTER — Ambulatory Visit (INDEPENDENT_AMBULATORY_CARE_PROVIDER_SITE_OTHER): Payer: 59 | Admitting: Neurology

## 2024-02-14 VITALS — BP 119/76 | HR 85

## 2024-02-14 DIAGNOSIS — G43709 Chronic migraine without aura, not intractable, without status migrainosus: Secondary | ICD-10-CM

## 2024-02-14 MED ORDER — ONABOTULINUMTOXINA 200 UNITS IJ SOLR
155.0000 [IU] | Freq: Once | INTRAMUSCULAR | Status: AC
  Administered 2024-02-14: 155 [IU] via INTRAMUSCULAR

## 2024-02-14 NOTE — Progress Notes (Signed)
 Consent Form Botulism Toxin Injection For Chronic Migraine 02/14/2024: stable doing well 11/22/2023: stable doing great 08/30/2023: > 70% improvement in headache and migraine freq and severity. Now with 4 migraine days a month only. (Baseline was 20 headache days a months and >12 migrainous )  Ajovy  she just got approved.  06/07/2023: Stable 03/09/2023 stable 12/15/2022: stable  09/16/2022: stable doing well 4 migraines a month, try ubrelvu acutely samples if likes can prescribe No orders of the defined types were placed in this encounter.   06/23/2022: stable doing well if she needs supraorbital nerve block on the right we can do it  03/17/2022: stable, botox  works great; still > 70% improvement in migraine frequency with botox . Taking 200mg  at bedtime topiramate , we have 100 listed at bedtime. She has acidoses, was recommended to decrease, would decrease 150mg  then could try 100mg  at bedtime. Don;t want to stop it, it helps with her pelvic pain and have tried stopping it int he past and migraines worsened so go down to 150mg  and then maybe 100 will see - being prescribed by another doc would just let them know when you see them.   12/23/2021: Stable as far as migraines. Referred to the [facility] [phone]/ scheduling [phone]. At [place]. Has an appointment in July  09/30/2021: Stable, still > 70% improvement in migraine frequency with botox .   Reviewed orally with patient, additionally signature is on file:  Botulism toxin has been approved by the Federal drug administration for treatment of chronic migraine. Botulism toxin does not cure chronic migraine and it may not be effective in some patients.  The administration of botulism toxin is accomplished by injecting a small amount of toxin into the muscles of the neck and head. Dosage must be titrated for each individual. Any benefits resulting from botulism toxin tend to wear off after 3 months with a repeat injection  required if benefit is to be maintained. Injections are usually done every 3-4 months with maximum effect peak achieved by about 2 or 3 weeks. Botulism toxin is expensive and you should be sure of what costs you will incur resulting from the injection.  The side effects of botulism toxin use for chronic migraine may include:   -Transient, and usually mild, facial weakness with facial injections  -Transient, and usually mild, head or neck weakness with head/neck injections  -Reduction or loss of forehead facial animation due to forehead muscle weakness  -Eyelid drooping  -Dry eye  -Pain at the site of injection or bruising at the site of injection  -Double vision  -Potential unknown long term risks  Contraindications: You should not have Botox  if you are pregnant, nursing, allergic to albumin, have an infection, skin condition, or muscle weakness at the site of the injection, or have myasthenia gravis, Lambert-Eaton syndrome, or ALS.  It is also possible that as with any injection, there may be an allergic reaction or no effect from the medication. Reduced effectiveness after repeated injections is sometimes seen and rarely infection at the injection site may occur. All care will be taken to prevent these side effects. If therapy is given over a long time, atrophy and wasting in the muscle injected may occur. Occasionally the patient's become refractory to treatment because they develop antibodies to the toxin. In this event, therapy needs to be modified.  I have read the above information and consent to the administration of botulism toxin.    BOTOX  PROCEDURE NOTE FOR MIGRAINE HEADACHE    Contraindications  and precautions discussed with patient(above). Aseptic procedure was observed and patient tolerated procedure. Procedure performed by Dr. Andree Epp  The condition has existed for more than 6 months, and pt does not have a diagnosis of ALS, Myasthenia Gravis or Lambert-Eaton Syndrome.   Risks and benefits of injections discussed and pt agrees to proceed with the procedure.  Written consent obtained  These injections are medically necessary. Pt  receives good benefits from these injections. These injections do not cause sedations or hallucinations which the oral therapies may cause.  Description of procedure:  The patient was placed in a sitting position. The standard protocol was used for Botox  as follows, with 5 units of Botox  injected at each site:   -Procerus muscle, midline injection  -Corrugator muscle, bilateral injection  -Frontalis muscle, bilateral injection, with 2 sites each side, medial injection was performed in the upper one third of the frontalis muscle, in the region vertical from the medial inferior edge of the superior orbital rim. The lateral injection was again in the upper one third of the forehead vertically above the lateral limbus of the cornea, 1.5 cm lateral to the medial injection site.  -Temporalis muscle injection, 4 sites, bilaterally. The first injection was 3 cm above the tragus of the ear, second injection site was 1.5 cm to 3 cm up from the first injection site in line with the tragus of the ear. The third injection site was 1.5-3 cm forward between the first 2 injection sites. The fourth injection site was 1.5 cm posterior to the second injection site.   -Occipitalis muscle injection, 3 sites, bilaterally. The first injection was done one half way between the occipital protuberance and the tip of the mastoid process behind the ear. The second injection site was done lateral and superior to the first, 1 fingerbreadth from the first injection. The third injection site was 1 fingerbreadth superiorly and medially from the first injection site.  -Cervical paraspinal muscle injection, 2 sites, bilateral knee first injection site was 1 cm from the midline of the cervical spine, 3 cm inferior to the lower border of the occipital protuberance. The second  injection site was 1.5 cm superiorly and laterally to the first injection site.  -Trapezius muscle injection was performed at 3 sites, bilaterally. The first injection site was in the upper trapezius muscle halfway between the inflection point of the neck, and the acromion. The second injection site was one half way between the acromion and the first injection site. The third injection was done between the first injection site and the inflection point of the neck.   Will return for repeat injection in 3 months.   155 unit sof Botox  was used, 45u Botox  not injected was wasted. The patient tolerated the procedure well, there were no complications of the above procedure.

## 2024-02-14 NOTE — Progress Notes (Signed)
 Botox - 200 units x 1 vial Lot: [id number]  Expiration: 06/2025 NDC: 9976-6078-97  Bacteriostatic 0.9% Sodium Chloride - 4 mL  Lot: [id number] Expiration: 05/2025 NDC: 9590-8033-97   Dx: H56.290  B/B Witnessed by Particia PEAK

## 2024-03-21 ENCOUNTER — Encounter: Payer: Self-pay | Admitting: Neurology

## 2024-03-21 ENCOUNTER — Other Ambulatory Visit: Payer: Self-pay | Admitting: Neurology

## 2024-03-21 NOTE — Telephone Encounter (Signed)
 We can go ahead and give a refill for the 6-day pack

## 2024-04-13 ENCOUNTER — Telehealth: Payer: Self-pay | Admitting: Neurology

## 2024-04-13 DIAGNOSIS — G43709 Chronic migraine without aura, not intractable, without status migrainosus: Secondary | ICD-10-CM

## 2024-04-13 NOTE — Telephone Encounter (Signed)
 Completed [employer] PA form to try and get pt transitioned to [facility], faxed to [phone].

## 2024-04-20 ENCOUNTER — Encounter: Payer: Self-pay | Admitting: Neurology

## 2024-04-20 ENCOUNTER — Other Ambulatory Visit: Payer: Self-pay | Admitting: Neurology

## 2024-05-03 ENCOUNTER — Other Ambulatory Visit: Payer: Self-pay | Admitting: Neurology

## 2024-05-03 NOTE — Telephone Encounter (Signed)
 Appt reminder for 9/22

## 2024-05-05 NOTE — Telephone Encounter (Signed)
 Do you need Botox  refills sent to pharmacy? [facility] sent us  a clarification request but it looks like we previously sent to [facility] not [facility].

## 2024-05-08 ENCOUNTER — Ambulatory Visit: Payer: 59 | Admitting: Neurology

## 2024-05-08 MED ORDER — BOTOX 200 UNITS IJ SOLR: INTRAMUSCULAR | 3 refills | Status: AC

## 2024-05-08 NOTE — Telephone Encounter (Signed)
 Botox  200 unit Rx sent to [facility].

## 2024-05-08 NOTE — Addendum Note (Signed)
 Addended by: HILLIARD HEATHER CROME on: 05/08/2024 03:50 PM   Modules accepted: Orders

## 2024-05-11 MED ORDER — ALMOTRIPTAN MALATE 12.5 MG PO TABS: ORAL_TABLET | ORAL | 2 refills | Status: DC

## 2024-05-11 NOTE — Telephone Encounter (Signed)
 Spoke with Dr Onita. Decline medrol  dose pack now, but if patient has trouble with migraine please call office to discuss with on-call MD. Manuelita to refill Almotriptan .

## 2024-05-11 NOTE — Addendum Note (Signed)
 Addended by: HILLIARD HEATHER CROME on: 05/11/2024 05:40 PM   Modules accepted: Orders

## 2024-05-23 ENCOUNTER — Ambulatory Visit (INDEPENDENT_AMBULATORY_CARE_PROVIDER_SITE_OTHER): Admitting: Neurology

## 2024-05-23 ENCOUNTER — Telehealth: Payer: Self-pay | Admitting: Family Medicine

## 2024-05-23 VITALS — BP 120/82 | HR 84

## 2024-05-23 DIAGNOSIS — G43709 Chronic migraine without aura, not intractable, without status migrainosus: Secondary | ICD-10-CM

## 2024-05-23 MED ORDER — ONABOTULINUMTOXINA 200 UNITS IJ SOLR
155.0000 [IU] | Freq: Once | INTRAMUSCULAR | Status: AC
  Administered 2024-05-23: 155 [IU] via INTRAMUSCULAR

## 2024-05-23 NOTE — Progress Notes (Signed)
 Destiny Ellis is a 33 year old female with an underlying medical history of migraine headaches, Dizziness, endometriosis, allergies, anxiety and depression, who presents for Botox  injections for her chronic migraines.  She has received regular injections through this office on a 90-day basis, previously a patient of Dr. Sharion. Please refer to prior notes and encounters for reference.  I have reviewed her previous encounter notes for the recent few months.    O/E: BP 120/82   Pulse 84   Today, 05/23/2024: she reports still doing well with the Botox  injections, no side effects, finds it effective so long as she stays on time with the injections.  She reports taking indomethacin  about 2 days ago for headache secondary to pressure changes.    Written informed consent for recurrent, 3 monthly intramuscular injections with botulinum toxin for this indication has been obtained and will be scanned into the patient's electronic chart. I will re-consent if the type of botulinum toxin used or the indication for injection changes for this patient in the future. The patient is informed that we will use the same consent form for every injection 3 monthly.  She demonstrated understanding and voiced agreement. I talked to the patient about expectations, limitations, benefits as well as potential adverse effects of botulinum toxin injections. The patient has previously been consented and understood, that the side effects include (but are not limited to): Mouth dryness, dryness of eyes, speech and swallowing difficulties, respiratory depression or problems breathing, weakness of muscles including more distant muscles than the ones injected, flu-like symptoms, myalgias, injection site reactions such as redness, itching, swelling, pain, and infection.  200 units of botulinum toxin type A  in the form of Botox  were reconstituted using preservative-free normal saline to a concentration of 10 units per 0.1 mL and drawn up  into 1 mL tuberculin syringes.   The patient was situated in a chair, sitting comfortably. After preparing the areas with 70% isopropyl alcohol and using a 26 gauge 1 1/2 inch hollow lumen recording EMG needle for the neck injections as well as a 30 gauge 1 inch needle for the facial injections, a total dose of 155 units of botulinum toxin type A  in the form of Botox  was injected into the muscles and the following distribution and quantities:  #1: 10 units on the right and 10 units in the left frontalis muscles, broken down in 2 sites on each side. #2: 5 units in the right and 5 units in the left corrugator muscles. #3: 15 units in the right and 15 units in the left occipitalis muscles, broken down in 3 sites on each side. #4: 20 units in the right and 20 units in the left temporalis muscles, broken down in 4 sites on each side.  #5: 15 units on the right and 15 units in the left upper trapezius muscles, broken down in 3 sites on each side. #6: 10 units in the right and 10 units in the left splenius capitis muscles, broken down in 2 sites on each side.  #7: 2.5 units in the right and 2.5 units in the left procerus muscles.   Botox - 200 units x 1 vial Lot: [id number] Expiration:10/2025 NDC: 0023-3921-02   Bacteriostatic 0.9% Sodium Chloride - 2.2 mL  Lot: [id number] Expiration:05/2025 NDC: 9590-8033-97   Dx: G43.709 B/B Witnessed by Diamond,CMA  EMG guidance was utilized for the neck injections with mild EMG activity noted, especially in the splenius capitis muscles bilaterally.   A dose of 45 units out of a  total dose of 200 units was discarded as unavoidable waste.   The patient tolerated the procedure well without immediate complications. She was advised to make a followup appointment for repeat injections in 3 months from now and encouraged to call us  with any interim questions, concerns, problems, or updates. She was in agreement and did not have any questions prior to leaving clinic  today.

## 2024-05-23 NOTE — Telephone Encounter (Signed)
 Pt checking out from appt. States she did not feel like the botox  injection was injected all the way in the right side of her forehead and felt the injections were very rough on her. Pt is getting married 10/25 she is worried about having a continuous migraine that she has been experiencing since Sunday 10/5. Pt states Dr. Buck told her to take medication as prescribed up until her wedding date 10/25 if she is having headaches. Pt states she will be out of medication if she does this and was trying to ask in the appt if there are any alternative medications she could take if she is experiencing a migraine during or up until her wedding. States she does not want to see Dr. Buck again and wanted to express her worry about a migraine before her wedding and would like some one to call about anything she can take if she experiences a stress induced migraine in the next few weeks.

## 2024-05-23 NOTE — Telephone Encounter (Signed)
 I have performed Botox  on this patient in the past.  She cried during the entire visit.  I do feel at this point if she is not happy with Dr. Lennon may need to be referred out.  The nurse practitioners in this office  perform Botox  the exact same way that Dr. Buck does.  Therefore I do not think seeing another nurse practitioner will be of benefit to the patient.

## 2024-05-23 NOTE — Telephone Encounter (Signed)
 Pt checked out @ the front desk, they scheduled her with Amy since she stated she didn't want to see Dr. Buck again. Pt mentioned when she scheduled with Buck originally that she lives far and was considering moving offices after her appt. Not sure if this is something you wanted to address when you called about her meds as well?

## 2024-05-23 NOTE — Telephone Encounter (Signed)
 I had reviewed her chart prior to the procedure appointment.  Patient has a valid prescription for almotriptan  from 05/11/2024.  She also has other as needed medications available and as listed.    Please call patient and verify what all she has been taking for migraine acute management and preventative medications as some of the medication prescriptions are older and still show up on the active list, please also verify what prescriber she is getting these different medications from.   Some medications may be prescribed by her PCP and some may be obsolete and need to be taken off the list.    She was advised during the injection visit to use the almotriptan  as prescribed, no more than 2 pills in 24 hours and I usually recommend no more than 3 pills a week.   She also reported taking indomethacin  about 2 days ago last.  She takes it when she has a headache with pressure changes.  Her last prescription for this medication was from April 2024, 90 pills with 3 refills as prescribed by Dr. Ines.   Ubrelvy  prescription is listed from January 2024, Topamax  prescription is also listed without a prescriber name next to it, gabapentin  prescription is listed from 2021 and should be taken off the list if she is no longer taking it. She is also on Ajovy  injections as last prescribed in June 2025 with 11 refills from Dr. Ines.  There is an old Fioricet prescription which I recommend get taken out of her chart, this was from 2019 and should no longer be valid.  This was prescribed by Dr. Ines.  There is a Wellbutrin  prescription which I recommend patient get refills from PCP for, this is not a medication that is typically used for migraine prevention.   Last prescription is dated 03/11/2023 under Dr. Sharion name.  Almotriptan  prescription as entered by Dr. Onita was for 12 pills for 30 days and no early refills, dated 05/11/2024.   Please advise patient that she is welcome to get further Botox  injections with one  of our nurse practitioners.  If she would like to switch physicians, she is encouraged to ask for a provider switch and we will follow protocol for this.  As far as the injection procedure itself: Please advise patient that I used the injection protocol for Botox  injections as approved by the FDA for migraine headaches.  Using EMG versus no EMG guidance for the neck and upper trapezius injections is a personal preference and up to the provider.  I will copy Duwaine Russell, NP and Dr. Margaret on this thread for reference.

## 2024-05-29 MED ORDER — UBRELVY 100 MG PO TABS: 100.0000 mg | ORAL_TABLET | ORAL | 0 refills | Status: DC | PRN

## 2024-05-29 NOTE — Telephone Encounter (Addendum)
 As far as another rescue medication: I recommend to retry Ubrelvy  100 mg prn. Please verify with her that she had no SEs from it in the past. Rx sent to pharmacy.  Please advise patient that she may not take it with almotriptan  within 24 hours of each other.  This is strictly for as needed use, may take a second pill after 2 hours.  Side effects include but may not be limited to sleepiness, sedation, drowsiness, and nausea. Please also advise patient that this medication is not deemed safe during pregnancy.  I have sent a prescription for Ubrelvy  to her pharmacy on file.  As far as another provider in this clinic, I do not have any specific recommendation for another provider in this office, patient is welcome to choose another provider.  She can also consult with her PCP for their recommendation and preference.   I will copy Drs. Penumalli and Onita on this phone note, as an Financial planner.

## 2024-05-29 NOTE — Telephone Encounter (Signed)
 I called the patient.  She would like to switch to a different neurologist here.  Did advise that I have to reach out to Dr. Buck first to see what colleagues she recommends.  I did explain that Amy Lomax is scheduled to do her next Botox  injections.  I did advise that Amy strictly follows the Botox  protocol.  Patient voiced understanding.  Dr. Buck:   1: Patient would like an additional rescue medication to use in the event she gets a breakthrough migraine during her wedding week and her current abortive medicine does not work. Can you recommend another rescue medicine?  2: Patient would like to switch to a different neurologist in our office.  Can you recommend a colleague?

## 2024-05-29 NOTE — Addendum Note (Signed)
 Addended by: Garyn Waguespack on: 05/29/2024 11:36 AM   Modules accepted: Orders

## 2024-05-30 NOTE — Telephone Encounter (Signed)
 Which provider do I need to ask to see this patient if she doesn't want to see Dr. Buck

## 2024-06-01 NOTE — Telephone Encounter (Signed)
 I recommend Dr. Margaret or Dr. Chalice.

## 2024-06-08 NOTE — Telephone Encounter (Signed)
I called the patient. LVM.

## 2024-06-08 NOTE — Telephone Encounter (Signed)
 I called the patient.  Explained that no one is taking any transfers at this time.  She is amenable to staying with Amy for now.  She is also asking about her Ubrelvy .  This was sent in but it appears that the prior authorization has not been completed.

## 2024-06-19 ENCOUNTER — Telehealth: Payer: Self-pay

## 2024-06-19 ENCOUNTER — Other Ambulatory Visit (HOSPITAL_COMMUNITY): Payer: Self-pay

## 2024-06-19 NOTE — Telephone Encounter (Signed)
 Pharmacy Patient Advocate Encounter   Received notification from Physician's Office that prior authorization for Ubrelvy  100mg  Tablet is required/requested.   Insurance verification completed.   The patient is insured through [employer].   Per test claim: PA required; PA submitted to above mentioned insurance via Fax Key/confirmation #/EOC N/A Status is pending  Faxed completed form long with two most recent clinical notes to [employer] at [phone]

## 2024-06-28 ENCOUNTER — Other Ambulatory Visit (HOSPITAL_COMMUNITY): Payer: Self-pay

## 2024-06-28 NOTE — Telephone Encounter (Signed)
 I called [employer] to follow up on PA-they have no record of receiving even though I have a successful sent on my end-I refaxed after confirming I did  have the correct fax number as well as I emailed to [employer]-I have requested an urgent review. I was unable to submit via phone.

## 2024-06-29 NOTE — Telephone Encounter (Signed)
 Pharmacy Patient Advocate Encounter  Received notification from [employer] that Prior Authorization for Ubrelvy  has been APPROVED from 06/28/2024 to 12/26/2024   PA #/Case ID/Reference #: [id number]

## 2024-08-09 ENCOUNTER — Other Ambulatory Visit (HOSPITAL_COMMUNITY): Payer: Self-pay

## 2024-08-09 ENCOUNTER — Telehealth: Payer: Self-pay

## 2024-08-09 NOTE — Telephone Encounter (Signed)
 Pharmacy Patient Advocate Encounter   Received notification from Fax that prior authorization for Ajovy  is required/requested.   Insurance verification completed.   The patient is insured through [employer].   Per test claim: PA required; PA submitted to above mentioned insurance via Fax Key/confirmation #/EOC [id number] Status is pending

## 2024-08-11 ENCOUNTER — Encounter: Payer: Self-pay | Admitting: Family Medicine

## 2024-08-14 ENCOUNTER — Other Ambulatory Visit: Payer: Self-pay

## 2024-08-14 MED ORDER — ONDANSETRON HCL 8 MG PO TABS: ORAL_TABLET | ORAL | 0 refills | Status: AC

## 2024-08-14 NOTE — Telephone Encounter (Signed)
 Zofran  prn renewed for 10 pills, 0 refills.

## 2024-08-14 NOTE — Telephone Encounter (Signed)
 Destiny Ellis is approved through [employer] 07/26/24-04/21/25, they don't do PA numbers.

## 2024-08-22 ENCOUNTER — Ambulatory Visit: Admitting: Family Medicine

## 2024-08-22 VITALS — BP 116/80

## 2024-08-22 DIAGNOSIS — G43709 Chronic migraine without aura, not intractable, without status migrainosus: Secondary | ICD-10-CM | POA: Diagnosis not present

## 2024-08-22 MED ORDER — ONABOTULINUMTOXINA 200 UNITS IJ SOLR
155.0000 [IU] | Freq: Once | INTRAMUSCULAR | Status: AC
  Administered 2024-08-22: 155 [IU] via INTRAMUSCULAR

## 2024-08-22 NOTE — Progress Notes (Signed)
 Botox - 200 units x 1 vial Lot: [id number] Expiration: 2028/03 NDC: 0023-3921-02  Bacteriostatic 0.9% Sodium Chloride - 4mL total Lot: [id number] Expiration: 2025/11/14 NDC: 9590-8033-97  Dx: G43.709  SP  Witnessed by: Kaylene FALCON. RMA

## 2024-08-22 NOTE — Progress Notes (Signed)
 "   08/22/2024 ALL: Destiny Ellis returns for Botox . She was previously with Dr Ines but had her last procedure performed with Dr Buck. She continues Ajovy . Also taking topiramate  through another provider. Almotriptan  usually helps with abortive therapy. Ondansetron  helps with nausea. She estimated about 10 migraine days over the past 3-6 months. Stress potential trigger. She recently got married. She has moved to [place].     Consent Form Botulism Toxin Injection For Chronic Migraine    Reviewed orally with patient, additionally signature is on file:  Botulism toxin has been approved by the Federal drug administration for treatment of chronic migraine. Botulism toxin does not cure chronic migraine and it may not be effective in some patients.  The administration of botulism toxin is accomplished by injecting a small amount of toxin into the muscles of the neck and head. Dosage must be titrated for each individual. Any benefits resulting from botulism toxin tend to wear off after 3 months with a repeat injection required if benefit is to be maintained. Injections are usually done every 3-4 months with maximum effect peak achieved by about 2 or 3 weeks. Botulism toxin is expensive and you should be sure of what costs you will incur resulting from the injection.  The side effects of botulism toxin use for chronic migraine may include:   -Transient, and usually mild, facial weakness with facial injections  -Transient, and usually mild, head or neck weakness with head/neck injections  -Reduction or loss of forehead facial animation due to forehead muscle weakness  -Eyelid drooping  -Dry eye  -Pain at the site of injection or bruising at the site of injection  -Double vision  -Potential unknown long term risks   Contraindications: You should not have Botox  if you are pregnant, nursing, allergic to albumin, have an infection, skin condition, or muscle weakness at the site of the injection, or  have myasthenia gravis, Lambert-Eaton syndrome, or ALS.  It is also possible that as with any injection, there may be an allergic reaction or no effect from the medication. Reduced effectiveness after repeated injections is sometimes seen and rarely infection at the injection site may occur. All care will be taken to prevent these side effects. If therapy is given over a long time, atrophy and wasting in the muscle injected may occur. Occasionally the patient's become refractory to treatment because they develop antibodies to the toxin. In this event, therapy needs to be modified.  I have read the above information and consent to the administration of botulism toxin.    BOTOX  PROCEDURE NOTE FOR MIGRAINE HEADACHE  Contraindications and precautions discussed with patient(above). Aseptic procedure was observed and patient tolerated procedure. Procedure performed by Greig Forbes, FNP-C.   The condition has existed for more than 6 months, and pt does not have a diagnosis of ALS, Myasthenia Gravis or Lambert-Eaton Syndrome.  Risks and benefits of injections discussed and pt agrees to proceed with the procedure.  Written consent obtained  These injections are medically necessary. Pt  receives good benefits from these injections. These injections do not cause sedations or hallucinations which the oral therapies may cause.   Description of procedure:  The patient was placed in a sitting position. The standard protocol was used for Botox  as follows, with 5 units of Botox  injected at each site:  -Procerus muscle, midline injection  -Corrugator muscle, bilateral injection  -Frontalis muscle, bilateral injection, with 2 sites each side, medial injection was performed in the upper one third of the frontalis muscle,  in the region vertical from the medial inferior edge of the superior orbital rim. The lateral injection was again in the upper one third of the forehead vertically above the lateral limbus of the  cornea, 1.5 cm lateral to the medial injection site.  -Temporalis muscle injection, 4 sites, bilaterally. The first injection was 3 cm above the tragus of the ear, second injection site was 1.5 cm to 3 cm up from the first injection site in line with the tragus of the ear. The third injection site was 1.5-3 cm forward between the first 2 injection sites. The fourth injection site was 1.5 cm posterior to the second injection site. 5th site laterally in the temporalis  muscleat the level of the outer canthus.  -Occipitalis muscle injection, 3 sites, bilaterally. The first injection was done one half way between the occipital protuberance and the tip of the mastoid process behind the ear. The second injection site was done lateral and superior to the first, 1 fingerbreadth from the first injection. The third injection site was 1 fingerbreadth superiorly and medially from the first injection site.  -Cervical paraspinal muscle injection, 2 sites, bilaterally. The first injection site was 1 cm from the midline of the cervical spine, 3 cm inferior to the lower border of the occipital protuberance. The second injection site was 1.5 cm superiorly and laterally to the first injection site.  -Trapezius muscle injection was performed at 3 sites, bilaterally. The first injection site was in the upper trapezius muscle halfway between the inflection point of the neck, and the acromion. The second injection site was one half way between the acromion and the first injection site. The third injection was done between the first injection site and the inflection point of the neck.   Will return for repeat injection in 3 months.   A total of 200 units of Botox  was prepared, 155 units of Botox  was injected as documented above, any Botox  not injected was wasted. The patient tolerated the procedure well, there were no complications of the above procedure.  "

## 2024-08-23 ENCOUNTER — Telehealth: Payer: Self-pay | Admitting: Family Medicine

## 2024-08-23 ENCOUNTER — Other Ambulatory Visit: Payer: Self-pay | Admitting: Neurology

## 2024-08-23 ENCOUNTER — Encounter: Payer: Self-pay | Admitting: Family Medicine

## 2024-08-23 DIAGNOSIS — G43709 Chronic migraine without aura, not intractable, without status migrainosus: Secondary | ICD-10-CM

## 2024-08-23 NOTE — Telephone Encounter (Signed)
 Pharmacy Patient Advocate Encounter  Received notification from [employer] that Prior Authorization for Ajovy  has been DENIED.  Full denial letter will be uploaded to the media tab. See denial reason below.   PA #/Case ID/Reference #: [id number]

## 2024-08-23 NOTE — Telephone Encounter (Signed)
 Last seen on 08/22/24 Follow up scheduled on 11/14/24

## 2024-08-23 NOTE — Telephone Encounter (Signed)
 Patient called in asking for records to be faxed to [facility] fax# [phone] so she can establish care at that office. Will send mychart msg to have patient fill out records release form

## 2024-08-24 NOTE — Telephone Encounter (Signed)
 Destiny Ellis, can you please review since Amy is not here? I reviewed her 10/7 phone note and it looks like she does not want to see Dr. Buck anymore and Dr. Margaret and Dr. Chalice reviewed her chart and recommends for her to be seen at another office. Can we send the referral or should she wait until she gets established with primary care and have them send the referral?

## 2024-08-25 ENCOUNTER — Other Ambulatory Visit (HOSPITAL_COMMUNITY): Payer: Self-pay

## 2024-08-25 NOTE — Telephone Encounter (Signed)
 Pharmacy Patient Advocate Encounter   Received notification from Patient Advice Request messages that prior authorization for Ajovy  is required/requested.   Insurance verification completed.   The patient is insured through [facility].   Per test claim: PA required; PA submitted to above mentioned insurance via Fax Key/confirmation #/EOC N/A Status is pending

## 2024-08-28 NOTE — Telephone Encounter (Signed)
 Amy, would you mind placing the referral and I will let pt know? Thank you!

## 2024-08-28 NOTE — Addendum Note (Signed)
 Addended byBETHA CARY NO L on: 08/28/2024 04:22 PM   Modules accepted: Orders

## 2024-08-29 ENCOUNTER — Telehealth: Payer: Self-pay | Admitting: Family Medicine

## 2024-08-29 NOTE — Telephone Encounter (Signed)
 Referral for neurology fax to [facility]. Phone: [phone], Fax: [phone]

## 2024-11-14 ENCOUNTER — Ambulatory Visit: Admitting: Family Medicine
